# Patient Record
Sex: Female | Born: 1935 | Race: White | Hispanic: No | Marital: Married | Smoking: Former smoker
Health system: Southern US, Community
[De-identification: ages and names within clinical notes are randomized; demographics above are authoritative.]

## PROBLEM LIST (undated history)

## (undated) DIAGNOSIS — E559 Vitamin D deficiency, unspecified: Secondary | ICD-10-CM

## (undated) DIAGNOSIS — M1990 Unspecified osteoarthritis, unspecified site: Secondary | ICD-10-CM

## (undated) DIAGNOSIS — K222 Esophageal obstruction: Secondary | ICD-10-CM

## (undated) DIAGNOSIS — C44310 Basal cell carcinoma of skin of unspecified parts of face: Secondary | ICD-10-CM

## (undated) DIAGNOSIS — K317 Polyp of stomach and duodenum: Secondary | ICD-10-CM

## (undated) DIAGNOSIS — Z8719 Personal history of other diseases of the digestive system: Secondary | ICD-10-CM

## (undated) DIAGNOSIS — I10 Essential (primary) hypertension: Secondary | ICD-10-CM

## (undated) DIAGNOSIS — K649 Unspecified hemorrhoids: Secondary | ICD-10-CM

## (undated) DIAGNOSIS — H3530 Unspecified macular degeneration: Secondary | ICD-10-CM

## (undated) DIAGNOSIS — J449 Chronic obstructive pulmonary disease, unspecified: Secondary | ICD-10-CM

## (undated) DIAGNOSIS — K219 Gastro-esophageal reflux disease without esophagitis: Secondary | ICD-10-CM

## (undated) DIAGNOSIS — S329XXA Fracture of unspecified parts of lumbosacral spine and pelvis, initial encounter for closed fracture: Secondary | ICD-10-CM

## (undated) DIAGNOSIS — E669 Obesity, unspecified: Secondary | ICD-10-CM

## (undated) DIAGNOSIS — J45991 Cough variant asthma: Secondary | ICD-10-CM

## (undated) DIAGNOSIS — S62102A Fracture of unspecified carpal bone, left wrist, initial encounter for closed fracture: Secondary | ICD-10-CM

## (undated) DIAGNOSIS — K5000 Crohn's disease of small intestine without complications: Secondary | ICD-10-CM

## (undated) HISTORY — PX: TONSILLECTOMY AND ADENOIDECTOMY: SUR1326

## (undated) HISTORY — DX: Polyp of stomach and duodenum: K31.7

## (undated) HISTORY — DX: Crohn's disease of small intestine without complications: K50.00

## (undated) HISTORY — DX: Vitamin D deficiency, unspecified: E55.9

## (undated) HISTORY — PX: CATARACT EXTRACTION: SUR2

## (undated) HISTORY — PX: COLONOSCOPY: SHX174

## (undated) HISTORY — PX: APPENDECTOMY: SHX54

## (undated) HISTORY — DX: Essential (primary) hypertension: I10

## (undated) HISTORY — PX: BACK SURGERY: SHX140

## (undated) HISTORY — DX: Fracture of unspecified parts of lumbosacral spine and pelvis, initial encounter for closed fracture: S32.9XXA

## (undated) HISTORY — DX: Gastro-esophageal reflux disease without esophagitis: K21.9

## (undated) HISTORY — DX: Cough variant asthma: J45.991

## (undated) HISTORY — PX: ESOPHAGOGASTRODUODENOSCOPY: SHX1529

## (undated) HISTORY — DX: Esophageal obstruction: K22.2

## (undated) HISTORY — DX: Obesity, unspecified: E66.9

## (undated) HISTORY — DX: Chronic obstructive pulmonary disease, unspecified: J44.9

## (undated) HISTORY — DX: Unspecified osteoarthritis, unspecified site: M19.90

## (undated) HISTORY — DX: Unspecified hemorrhoids: K64.9

## (undated) HISTORY — DX: Fracture of unspecified carpal bone, left wrist, initial encounter for closed fracture: S62.102A

## (undated) HISTORY — DX: Unspecified macular degeneration: H35.30

---

## 1998-01-28 ENCOUNTER — Other Ambulatory Visit: Admission: RE | Admit: 1998-01-28 | Discharge: 1998-01-28 | Payer: Self-pay | Admitting: Obstetrics and Gynecology

## 1999-01-13 ENCOUNTER — Encounter: Admission: RE | Admit: 1999-01-13 | Discharge: 1999-01-13 | Payer: Self-pay | Admitting: Family Medicine

## 1999-02-12 ENCOUNTER — Other Ambulatory Visit: Admission: RE | Admit: 1999-02-12 | Discharge: 1999-02-12 | Payer: Self-pay | Admitting: Obstetrics and Gynecology

## 1999-05-06 ENCOUNTER — Encounter: Admission: RE | Admit: 1999-05-06 | Discharge: 1999-05-06 | Payer: Self-pay | Admitting: Family Medicine

## 1999-05-10 ENCOUNTER — Encounter: Payer: Self-pay | Admitting: Sports Medicine

## 1999-05-10 ENCOUNTER — Encounter: Admission: RE | Admit: 1999-05-10 | Discharge: 1999-05-10 | Payer: Self-pay | Admitting: Sports Medicine

## 1999-05-11 ENCOUNTER — Other Ambulatory Visit: Admission: RE | Admit: 1999-05-11 | Discharge: 1999-05-11 | Payer: Self-pay | Admitting: Obstetrics and Gynecology

## 1999-05-11 ENCOUNTER — Encounter (INDEPENDENT_AMBULATORY_CARE_PROVIDER_SITE_OTHER): Payer: Self-pay

## 1999-09-06 ENCOUNTER — Encounter: Admission: RE | Admit: 1999-09-06 | Discharge: 1999-09-06 | Payer: Self-pay | Admitting: Family Medicine

## 1999-09-16 ENCOUNTER — Ambulatory Visit (HOSPITAL_BASED_OUTPATIENT_CLINIC_OR_DEPARTMENT_OTHER): Admission: RE | Admit: 1999-09-16 | Discharge: 1999-09-16 | Payer: Self-pay | Admitting: Orthopedic Surgery

## 1999-09-16 ENCOUNTER — Encounter (INDEPENDENT_AMBULATORY_CARE_PROVIDER_SITE_OTHER): Payer: Self-pay | Admitting: *Deleted

## 2000-03-09 ENCOUNTER — Other Ambulatory Visit: Admission: RE | Admit: 2000-03-09 | Discharge: 2000-03-09 | Payer: Self-pay | Admitting: Obstetrics and Gynecology

## 2001-06-14 ENCOUNTER — Other Ambulatory Visit: Admission: RE | Admit: 2001-06-14 | Discharge: 2001-06-14 | Payer: Self-pay | Admitting: Obstetrics and Gynecology

## 2001-07-27 ENCOUNTER — Encounter (INDEPENDENT_AMBULATORY_CARE_PROVIDER_SITE_OTHER): Payer: Self-pay | Admitting: Gastroenterology

## 2002-07-01 ENCOUNTER — Other Ambulatory Visit: Admission: RE | Admit: 2002-07-01 | Discharge: 2002-07-01 | Payer: Self-pay | Admitting: Obstetrics and Gynecology

## 2003-09-19 ENCOUNTER — Other Ambulatory Visit: Admission: RE | Admit: 2003-09-19 | Discharge: 2003-09-19 | Payer: Self-pay | Admitting: Obstetrics and Gynecology

## 2004-10-22 ENCOUNTER — Ambulatory Visit (HOSPITAL_COMMUNITY): Admission: RE | Admit: 2004-10-22 | Discharge: 2004-10-22 | Payer: Self-pay | Admitting: Internal Medicine

## 2004-12-31 ENCOUNTER — Other Ambulatory Visit: Admission: RE | Admit: 2004-12-31 | Discharge: 2004-12-31 | Payer: Self-pay | Admitting: Internal Medicine

## 2005-02-28 DIAGNOSIS — S329XXA Fracture of unspecified parts of lumbosacral spine and pelvis, initial encounter for closed fracture: Secondary | ICD-10-CM

## 2005-02-28 HISTORY — PX: TOTAL KNEE ARTHROPLASTY: SHX125

## 2005-02-28 HISTORY — DX: Fracture of unspecified parts of lumbosacral spine and pelvis, initial encounter for closed fracture: S32.9XXA

## 2005-03-16 ENCOUNTER — Inpatient Hospital Stay (HOSPITAL_COMMUNITY): Admission: RE | Admit: 2005-03-16 | Discharge: 2005-03-19 | Payer: Self-pay | Admitting: Orthopedic Surgery

## 2006-12-20 ENCOUNTER — Encounter: Admission: RE | Admit: 2006-12-20 | Discharge: 2006-12-20 | Payer: Self-pay | Admitting: Internal Medicine

## 2007-01-03 ENCOUNTER — Ambulatory Visit: Payer: Self-pay | Admitting: Internal Medicine

## 2007-04-20 DIAGNOSIS — I10 Essential (primary) hypertension: Secondary | ICD-10-CM

## 2007-04-20 DIAGNOSIS — E669 Obesity, unspecified: Secondary | ICD-10-CM | POA: Insufficient documentation

## 2007-04-20 DIAGNOSIS — M810 Age-related osteoporosis without current pathological fracture: Secondary | ICD-10-CM | POA: Insufficient documentation

## 2007-04-20 DIAGNOSIS — E559 Vitamin D deficiency, unspecified: Secondary | ICD-10-CM | POA: Insufficient documentation

## 2007-04-26 ENCOUNTER — Other Ambulatory Visit: Admission: RE | Admit: 2007-04-26 | Discharge: 2007-04-26 | Payer: Self-pay | Admitting: Family Medicine

## 2007-11-26 ENCOUNTER — Ambulatory Visit: Payer: Self-pay | Admitting: Internal Medicine

## 2007-11-26 ENCOUNTER — Encounter: Payer: Self-pay | Admitting: Internal Medicine

## 2007-11-26 DIAGNOSIS — M542 Cervicalgia: Secondary | ICD-10-CM

## 2007-11-26 DIAGNOSIS — M5020 Other cervical disc displacement, unspecified cervical region: Secondary | ICD-10-CM | POA: Insufficient documentation

## 2007-11-28 ENCOUNTER — Telehealth (INDEPENDENT_AMBULATORY_CARE_PROVIDER_SITE_OTHER): Payer: Self-pay | Admitting: *Deleted

## 2007-12-16 ENCOUNTER — Ambulatory Visit: Payer: Self-pay | Admitting: Internal Medicine

## 2007-12-16 ENCOUNTER — Observation Stay (HOSPITAL_COMMUNITY): Admission: EM | Admit: 2007-12-16 | Discharge: 2007-12-21 | Payer: Self-pay | Admitting: Emergency Medicine

## 2007-12-16 ENCOUNTER — Ambulatory Visit: Payer: Self-pay | Admitting: *Deleted

## 2007-12-17 ENCOUNTER — Ambulatory Visit: Payer: Self-pay | Admitting: *Deleted

## 2007-12-18 ENCOUNTER — Ambulatory Visit: Payer: Self-pay | Admitting: Physical Medicine & Rehabilitation

## 2008-01-15 ENCOUNTER — Telehealth: Payer: Self-pay | Admitting: Internal Medicine

## 2008-02-04 ENCOUNTER — Ambulatory Visit: Payer: Self-pay | Admitting: Internal Medicine

## 2008-02-04 DIAGNOSIS — S79919A Unspecified injury of unspecified hip, initial encounter: Secondary | ICD-10-CM

## 2008-02-04 DIAGNOSIS — S79929A Unspecified injury of unspecified thigh, initial encounter: Secondary | ICD-10-CM

## 2008-02-20 ENCOUNTER — Telehealth: Payer: Self-pay | Admitting: Internal Medicine

## 2008-06-05 ENCOUNTER — Ambulatory Visit: Payer: Self-pay | Admitting: Internal Medicine

## 2008-06-05 DIAGNOSIS — M5412 Radiculopathy, cervical region: Secondary | ICD-10-CM | POA: Insufficient documentation

## 2008-06-05 LAB — CONVERTED CEMR LAB
ALT: 32 units/L (ref 0–35)
AST: 33 units/L (ref 0–37)
Basophils Absolute: 0.1 10*3/uL (ref 0.0–0.1)
Basophils Relative: 1 % (ref 0.0–3.0)
Bilirubin Urine: NEGATIVE
CO2: 27 meq/L (ref 19–32)
Calcium: 9.3 mg/dL (ref 8.4–10.5)
Chloride: 108 meq/L (ref 96–112)
Eosinophils Relative: 3.5 % (ref 0.0–5.0)
Glucose, Bld: 111 mg/dL — ABNORMAL HIGH (ref 70–99)
HCT: 39.1 % (ref 36.0–46.0)
Lymphocytes Relative: 20.2 % (ref 12.0–46.0)
MCHC: 33.6 g/dL (ref 30.0–36.0)
Magnesium: 2.3 mg/dL (ref 1.5–2.5)
Monocytes Absolute: 0.4 10*3/uL (ref 0.1–1.0)
Monocytes Relative: 5.6 % (ref 3.0–12.0)
Platelets: 311 10*3/uL (ref 150.0–400.0)
Potassium: 4.6 meq/L (ref 3.5–5.1)
RDW: 12.5 % (ref 11.5–14.6)
Total Protein: 7 g/dL (ref 6.0–8.3)
Urine Glucose: NEGATIVE mg/dL
VLDL: 19.4 mg/dL (ref 0.0–40.0)
WBC: 7.8 10*3/uL (ref 4.5–10.5)

## 2008-06-09 ENCOUNTER — Encounter: Payer: Self-pay | Admitting: Internal Medicine

## 2008-06-09 LAB — CONVERTED CEMR LAB: Vit D, 25-Hydroxy: 35 ng/mL (ref 30–89)

## 2008-06-10 ENCOUNTER — Encounter: Payer: Self-pay | Admitting: Internal Medicine

## 2008-06-10 ENCOUNTER — Ambulatory Visit: Payer: Self-pay | Admitting: Family Medicine

## 2008-06-11 ENCOUNTER — Encounter: Admission: RE | Admit: 2008-06-11 | Discharge: 2008-06-11 | Payer: Self-pay | Admitting: Internal Medicine

## 2008-06-13 ENCOUNTER — Encounter: Admission: RE | Admit: 2008-06-13 | Discharge: 2008-06-13 | Payer: Self-pay | Admitting: Internal Medicine

## 2008-06-16 ENCOUNTER — Encounter: Payer: Self-pay | Admitting: Internal Medicine

## 2008-06-26 ENCOUNTER — Ambulatory Visit: Payer: Self-pay | Admitting: Internal Medicine

## 2008-06-26 DIAGNOSIS — R7309 Other abnormal glucose: Secondary | ICD-10-CM | POA: Insufficient documentation

## 2008-07-15 ENCOUNTER — Encounter: Admission: RE | Admit: 2008-07-15 | Discharge: 2008-08-14 | Payer: Self-pay | Admitting: Internal Medicine

## 2009-01-20 ENCOUNTER — Ambulatory Visit: Payer: Self-pay | Admitting: Internal Medicine

## 2009-01-20 DIAGNOSIS — R0789 Other chest pain: Secondary | ICD-10-CM | POA: Insufficient documentation

## 2009-01-21 ENCOUNTER — Emergency Department (HOSPITAL_COMMUNITY): Admission: EM | Admit: 2009-01-21 | Discharge: 2009-01-21 | Payer: Self-pay | Admitting: Emergency Medicine

## 2009-01-21 ENCOUNTER — Telehealth: Payer: Self-pay | Admitting: Internal Medicine

## 2009-02-23 ENCOUNTER — Telehealth: Payer: Self-pay | Admitting: Internal Medicine

## 2009-05-11 ENCOUNTER — Telehealth: Payer: Self-pay | Admitting: Internal Medicine

## 2009-11-03 ENCOUNTER — Ambulatory Visit (HOSPITAL_BASED_OUTPATIENT_CLINIC_OR_DEPARTMENT_OTHER): Admission: RE | Admit: 2009-11-03 | Discharge: 2009-11-03 | Payer: Self-pay | Admitting: Orthopedic Surgery

## 2009-12-29 ENCOUNTER — Encounter: Payer: Self-pay | Admitting: Internal Medicine

## 2010-03-28 LAB — CONVERTED CEMR LAB
BUN: 16 mg/dL (ref 6–23)
Bilirubin, Direct: 0.1 mg/dL (ref 0.0–0.3)
CO2: 30 meq/L (ref 19–32)
Chloride: 104 meq/L (ref 96–112)
Creatinine, Ser: 0.9 mg/dL (ref 0.4–1.2)
Eosinophils Absolute: 0.5 10*3/uL (ref 0.0–0.7)
Glucose, Bld: 114 mg/dL — ABNORMAL HIGH (ref 70–99)
Lymphocytes Relative: 22.3 % (ref 12.0–46.0)
MCHC: 33.9 g/dL (ref 30.0–36.0)
Neutrophils Relative %: 63.3 % (ref 43.0–77.0)
Sodium: 141 meq/L (ref 135–145)
WBC: 7 10*3/uL (ref 4.5–10.5)

## 2010-03-30 NOTE — Miscellaneous (Signed)
Summary: flu shot  Clinical Lists Changes  Observations: Added new observation of FLU VAX: Historical (12/28/2009 9:49)      Immunization History:  Influenza Immunization History:    Influenza:  historical (12/28/2009) [facility] [address] [facility] 0.59m Left Deltoid IM [employer] Lot# [id number]

## 2010-03-30 NOTE — Progress Notes (Signed)
Summary: WORK IN?   Phone Note Call from Patient Call back at [phone]   Summary of Call: Pt cut her finger with a kitchen knife today. Bleeding is under control but feels that she needs sutures and would like to know if Dr Ronnald Ramp would work pt into schedule for this?  Initial call taken by: Charlsie Quest, [place],  May 11, 2009 2:03 PM  Follow-up for Phone Call        i can't do this today Follow-up by: Janith Lima MD,  May 11, 2009 3:09 PM  Additional Follow-up for Phone Call Additional follow up Details #1::        Pt informed & advised to go to [facility] or ER Additional Follow-up by: Charlsie Quest, CMA,  May 11, 2009 3:53 PM

## 2010-04-19 NOTE — Op Note (Signed)
NAME:  Ashley Parker, Ashley Parker              ACCOUNT NO.:  [id number]  MEDICAL RECORD NO.:  [id number]          PATIENT TYPE:  AMB  LOCATION:  [place]                          FACILITY:  [facility]  PHYSICIAN:  Daryll Brod, [name]       DATE OF BIRTH:  1931/06/24  DATE OF PROCEDURE:  11/03/2009 DATE OF DISCHARGE:                              OPERATIVE REPORT  PREOPERATIVE DIAGNOSIS:  Mucoid tumor right ring finger.  POSTOPERATIVE DIAGNOSIS:  Mucoid tumor right ring finger.  OPERATION:  Decompression cyst, debridement distal interphalangeal joint with excision cyst right ring finger.  SURGEON:  Daryll Brod, MD  ANESTHESIA:  Regional with local infiltration.  ANESTHESIOLOGIST:  Nelda Severe. Tobias Alexander, MD  HISTORY:  The patient is a 77 year old female with a history of a mass on the dorsal aspect of right ring finger.  This is grooving the distal nail to the termination.  She is not complaining of pain or discomfort. The skin is translucent.  X-rays reveal mild degenerative change in distal interphalangeal joint.  Transillumination reveals a cyst.  She is desirous of having this removed.  She is aware of risks and complications including infection, recurrence of injury to arteries, nerves, tendons, incomplete relief of symptoms, dystrophy, the possibility of recurrence.  So long as a joint is present, we would not recommend fusion at this point in time.  PROCEDURE:  The patient was brought to the operating room where a forearm-based IV regional anesthetic was carried out without difficulty. After the patient was seen in the preoperative area, the extremity was marked by both the patient and surgeon, questions encouraged and answered, and antibiotic given.  Following adequate anesthesia, she was draped and prepped using ChloraPrep, supine position, right arm free.  A 3-minute dry time was allowed, time-out taken, confirming the patient and procedure.  A curvilinear incision was made over the  distal interphalangeal joint right ring finger, carried down through subcutaneous tissue.  Bleeders were electrocauterized with bipolar.  The skin was elevated distally with sharp dissection taking care to protect the nail matrix extensor tendon.  The stalk of the cyst was followed distally with a house curette beneath the skin.  The cyst was bluntly dissected and removed with small rongeur.  The joint was opened where the stalk egressed.  A debridement of the joint was then performed with complete synovectomy.  The specimen was sent to Pathology.  No further lesions were identified.  The wound was copiously irrigated with saline. The skin then closed with interrupted 5-0 Vicryl Rapide sutures.  Local infiltration with 0.25% Marcaine as a digital block was given, approximately 5 mL was used. A sterile compressive dressing and splint to the finger applied.  On deflation of the tourniquet, all fingers pinked.  She was taken to the recovery room for observation in satisfactory condition.  She will be discharged home to return to the [place] [facility] in 1 week on Vicodin.          ______________________________ Daryll Brod, [name]    GK/MEDQ  D:  11/03/2009  T:  11/03/2009  Job:  [id number]  cc:   Scarlette Calico, MD  Electronically Signed by Daryll Brod [name] on 04/19/2010 12:05:34 PM

## 2010-05-13 LAB — BASIC METABOLIC PANEL
BUN: 19 mg/dL (ref 6–23)
CO2: 24 mEq/L (ref 19–32)
Calcium: 9.3 mg/dL (ref 8.4–10.5)
Chloride: 107 mEq/L (ref 96–112)
Creatinine, Ser: 0.93 mg/dL (ref 0.4–1.2)
GFR calc Af Amer: >60 mL/min (ref >60)
GFR calc non Af Amer: 59 mL/min — ABNORMAL LOW (ref >60)
Glucose, Bld: 137 mg/dL — ABNORMAL HIGH (ref 70–99)
Potassium: 3.8 mEq/L (ref 3.5–5.1)
Sodium: 139 mEq/L (ref 135–145)

## 2010-05-13 LAB — POCT HEMOGLOBIN-HEMACUE: Hemoglobin: 14.9 g/dL (ref 12.0–15.0)

## 2010-06-02 LAB — POCT I-STAT, CHEM 8
BUN: 18 mg/dL (ref 6–23)
Calcium, Ion: 1.11 mmol/L — ABNORMAL LOW (ref 1.12–1.32)
Chloride: 105 mEq/L (ref 96–112)
Glucose, Bld: 112 mg/dL — ABNORMAL HIGH (ref 70–99)
HCT: 45 % (ref 36.0–46.0)
Potassium: 4 mEq/L (ref 3.5–5.1)

## 2010-06-02 LAB — D-DIMER, QUANTITATIVE: D-Dimer, Quant: 0.88 ug/mL-FEU — ABNORMAL HIGH (ref 0.00–0.48)

## 2010-06-18 ENCOUNTER — Other Ambulatory Visit (INDEPENDENT_AMBULATORY_CARE_PROVIDER_SITE_OTHER): Payer: Medicare Other

## 2010-06-18 ENCOUNTER — Encounter: Payer: Self-pay | Admitting: Internal Medicine

## 2010-06-18 ENCOUNTER — Ambulatory Visit (INDEPENDENT_AMBULATORY_CARE_PROVIDER_SITE_OTHER)
Admission: RE | Admit: 2010-06-18 | Discharge: 2010-06-18 | Disposition: A | Discharge status: Home / Self Care | Payer: Medicare Other | Source: Ambulatory Visit | Attending: Internal Medicine | Admitting: Internal Medicine

## 2010-06-18 ENCOUNTER — Ambulatory Visit (INDEPENDENT_AMBULATORY_CARE_PROVIDER_SITE_OTHER): Payer: Medicare Other | Admitting: Internal Medicine

## 2010-06-18 ENCOUNTER — Other Ambulatory Visit (INDEPENDENT_AMBULATORY_CARE_PROVIDER_SITE_OTHER): Payer: Medicare Other | Admitting: Internal Medicine

## 2010-06-18 VITALS — BP 118/82 | HR 72 | Temp 98.4°F | Resp 16 | Wt 226.0 lb

## 2010-06-18 DIAGNOSIS — R7309 Other abnormal glucose: Secondary | ICD-10-CM

## 2010-06-18 DIAGNOSIS — I10 Essential (primary) hypertension: Secondary | ICD-10-CM

## 2010-06-18 DIAGNOSIS — Z87892 Personal history of anaphylaxis: Secondary | ICD-10-CM

## 2010-06-18 DIAGNOSIS — S8990XA Unspecified injury of unspecified lower leg, initial encounter: Secondary | ICD-10-CM

## 2010-06-18 DIAGNOSIS — E559 Vitamin D deficiency, unspecified: Secondary | ICD-10-CM

## 2010-06-18 DIAGNOSIS — M810 Age-related osteoporosis without current pathological fracture: Secondary | ICD-10-CM

## 2010-06-18 DIAGNOSIS — S99929A Unspecified injury of unspecified foot, initial encounter: Secondary | ICD-10-CM

## 2010-06-18 DIAGNOSIS — E785 Hyperlipidemia, unspecified: Secondary | ICD-10-CM

## 2010-06-18 DIAGNOSIS — S8991XA Unspecified injury of right lower leg, initial encounter: Secondary | ICD-10-CM

## 2010-06-18 DIAGNOSIS — K5080 Crohn's disease of both small and large intestine without complications: Secondary | ICD-10-CM

## 2010-06-18 LAB — COMPREHENSIVE METABOLIC PANEL
ALT: 26 U/L (ref 0–35)
AST: 24 U/L (ref 0–37)
Albumin: 4.2 g/dL (ref 3.5–5.2)
BUN: 23 mg/dL (ref 6–23)
CO2: 27 mEq/L (ref 19–32)
Calcium: 10.3 mg/dL (ref 8.4–10.5)
Chloride: 104 mEq/L (ref 96–112)
Creatinine, Ser: 0.7 mg/dL (ref 0.4–1.2)
GFR: 83.45 mL/min (ref >60.00)
Potassium: 4.4 mEq/L (ref 3.5–5.1)

## 2010-06-18 LAB — HEMOGLOBIN A1C: Hgb A1c MFr Bld: 5.8 % (ref 4.6–6.5)

## 2010-06-18 LAB — CBC WITH DIFFERENTIAL/PLATELET
Basophils Absolute: 0.1 10*3/uL (ref 0.0–0.1)
Basophils Relative: 1.1 % (ref 0.0–3.0)
Eosinophils Absolute: 0.3 10*3/uL (ref 0.0–0.7)
HCT: 41.5 % (ref 36.0–46.0)
Hemoglobin: 14.3 g/dL (ref 12.0–15.0)
Lymphocytes Relative: 21.6 % (ref 12.0–46.0)
Lymphs Abs: 1.7 10*3/uL (ref 0.7–4.0)
MCHC: 34.3 g/dL (ref 30.0–36.0)
Neutro Abs: 5.3 10*3/uL (ref 1.4–7.7)
RBC: 4.69 Mil/uL (ref 3.87–5.11)
RDW: 13.7 % (ref 11.5–14.6)

## 2010-06-18 LAB — LIPID PANEL: Cholesterol: 221 mg/dL — ABNORMAL HIGH (ref 0–200)

## 2010-06-18 MED ORDER — LISINOPRIL-HYDROCHLOROTHIAZIDE 20-25 MG PO TABS: 1.0000 | ORAL_TABLET | Freq: Every day | ORAL | Status: DC

## 2010-06-18 MED ORDER — EPINEPHRINE 0.3 MG/0.3ML IJ DEVI: 0.3000 mg | Freq: Once | INTRAMUSCULAR | Status: AC

## 2010-06-18 NOTE — Assessment & Plan Note (Signed)
Will check an xray and treat accordingly

## 2010-06-18 NOTE — Assessment & Plan Note (Signed)
Will recheck her vit D level today

## 2010-06-18 NOTE — Patient Instructions (Signed)
Bruise, Contusion, Hematoma A bruise (contusion) or hematoma is a collection of blood under skin causing an area of discoloration. It is caused by an injury to blood vessels beneath the injured area with a release of blood into that area. As blood accumulates it is known as a hematoma. This collection of blood causes a blue to dark blue color. As the injury improves over days to weeks it turns to a yellowish color and then usually disappears completely over the same period of time. These generally resolve completely without problems. The hematoma rarely requires drainage. HOME CARE INSTRUCTIONS  Apply ice to the injured area for 20 minutes 3 times per day for the first 1 or 2 days.   Put the ice in a plastic bag and place a towel between the bag of ice and your skin. Discontinue the ice if it causes pain.   If bleeding is more than just a little, apply pressure to the area for at least thirty minutes to decrease the amount of bruising. Apply pressure and ice as your caregiver suggests.   If the injury is on an extremity, elevation of that part may help to decrease pain and swelling. Wrapping with an ace or supportive wrap may also be helpful. If the bruise is on a lower extremity and is painful, crutches may be helpful for a couple days.   If you have been given a tetanus shot because the skin was broken, your arm may get swollen, red and warm to touch at the shot site. This is a normal response to the medicine in the shot. If you did not receive a tetanus shot today because you did not recall when your last one was given, make sure to check with your caregiver's office and determine if one is needed. Generally for a "dirty" wound, you should receive a tetanus booster if you have not had one in the last five years. If you have a "clean" wound, you should receive a tetanus booster if you have not had one within the last ten years.  SEEK MEDICAL CARE IF:  You have pain not controlled with over the  counter medications. Only take over-the-counter or prescription medicines for pain, discomfort, or fever as directed by your caregiver. Do not use aspirin as it may cause bleeding.   You develop increasing pain or swelling in the area of injury.   An oral temperature above 100.5 develops.   You develop any problems which seem worse than the problems which brought you in.  SEEK IMMEDIATE MEDICAL CARE IF:  You develop severe pain in the area of the bruise out of proportion to the initial injury.   The bruised area becomes red, tender, and swollen.  MAKE SURE YOU:   Understand these instructions.   Will watch your condition.   Will get help right away if you are not doing well or get worse.  Document Released: 11/24/2004 Document Re-Released: 05/13/2008 [facility] Patient Information 2011 [place].Hypertension (High Blood Pressure) As your heart beats, it forces blood through your arteries. This force is your blood pressure. If the pressure is too high, it is called hypertension (HTN) or high blood pressure. HTN is dangerous because you may have it and not know it. High blood pressure may mean that your heart has to work harder to pump blood. Your arteries may be narrow or stiff. The extra work puts you at risk for heart disease, stroke, and other problems.  Blood pressure consists of two numbers, a higher  number over a lower, 110/72, for example. It is stated as "110 over 72." The ideal is below 120 for the top number (systolic) and under 80 for the bottom (diastolic). Write down your blood pressure today. You should pay close attention to your blood pressure if you have certain conditions such as:  Heart failure.  Prior heart attack.   Diabetes   Chronic kidney disease.   Prior stroke.   Multiple risk factors for heart disease.   To see if you have HTN, your blood pressure should be measured while you are seated with your arm held at the level of the heart. It should be  measured at least twice. A one-time elevated blood pressure reading (especially in the Emergency Department) does not mean that you need treatment. There may be conditions in which the blood pressure is different between your right and left arms. It is important to see your caregiver soon for a recheck. Most people have essential hypertension which means that there is not a specific cause. This type of high blood pressure may be lowered by changing lifestyle factors such as:  Stress.  Smoking.   Lack of exercise.   Excessive weight.  Drug/tobacco/alcohol use.   Eating less salt.   Most people do not have symptoms from high blood pressure until it has caused damage to the body. Effective treatment can often prevent, delay or reduce that damage. TREATMENT Treatment for high blood pressure, when a cause has been identified, is directed at the cause. There are a large number of medications to treat HTN. These fall into several categories, and your caregiver will help you select the medicines that are best for you. Medications may have side effects. You should review side effects with your caregiver. If your blood pressure stays high after you have made lifestyle changes or started on medicines,   Your medication(s) may need to be changed.   Other problems may need to be addressed.   Be certain you understand your prescriptions, and know how and when to take your medicine.   Be sure to follow up with your caregiver within the time frame advised (usually within two weeks) to have your blood pressure rechecked and to review your medications.   If you are taking more than one medicine to lower your blood pressure, make sure you know how and at what times they should be taken. Taking two medicines at the same time can result in blood pressure that is too low.  SEEK IMMEDIATE MEDICAL CARE IF YOU DEVELOP:  A severe headache, blurred or changing vision, or confusion.   Unusual weakness or  numbness, or a faint feeling.   Severe chest or abdominal pain, vomiting, or breathing problems.  MAKE SURE YOU:   Understand these instructions.   Will watch your condition.   Will get help right away if you are not doing well or get worse.  Document Released: 02/14/2005 Document Re-Released: 08/04/2009 [facility] Patient Information 2011 [place].

## 2010-06-18 NOTE — Progress Notes (Signed)
Subjective:    Patient ID: Ashley Parker, female    DOB: 01-Aug-1931, 77 y.o.   MRN: [id number]  Hypertension This is a chronic problem. The current episode started more than 1 year ago. The problem has been gradually improving since onset. The problem is controlled. Pertinent negatives include no anxiety, blurred vision, chest pain, headaches, malaise/fatigue, neck pain, orthopnea, palpitations, peripheral edema, PND, shortness of breath or sweats. There are no associated agents to hypertension. Past treatments include ACE inhibitors and diuretics. The current treatment provides significant improvement. There are no compliance problems.    Also, she fell one week ago and landed on her right knee and has bruising, swelling, and pain around the knee. She has been taking percocet and naproxen for the pain. She can bear weight and bend the knee without much difficulty.  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   Review of Systems  Constitutional: Negative for fever, chills, malaise/fatigue, diaphoresis, activity change, appetite change, fatigue and unexpected weight change.  HENT: Negative for neck pain.   Eyes: Negative for blurred vision, pain, redness and itching.  Respiratory: Negative for apnea, cough, choking, chest tightness, shortness of breath, wheezing and stridor.   Cardiovascular: Negative for chest pain, palpitations, orthopnea, leg swelling and PND.  Gastrointestinal: Negative for nausea, abdominal pain, diarrhea, constipation, blood in stool and abdominal distention.  Genitourinary: Negative for dysuria, urgency, frequency, hematuria, decreased urine volume and difficulty urinating.  Musculoskeletal: Positive for joint swelling and arthralgias (right knee). Negative for back pain and gait problem.  Skin: Negative for color change, pallor and rash.  Neurological: Negative for dizziness, tremors, seizures, syncope, facial asymmetry, speech  difficulty, weakness, light-headedness, numbness and headaches.  Hematological: Negative for adenopathy.  Psychiatric/Behavioral: Negative for behavioral problems, self-injury, dysphoric mood and agitation. The patient is not nervous/anxious.       Lab Results  Component Value Date   WBC 7.0 01/20/2009   HGB 14.9 11/03/2009   HCT 45.0 01/21/2009   PLT 312.0 01/20/2009   CHOL 197 06/05/2008   TRIG 97.0 06/05/2008   HDL 54.10 06/05/2008   ALT 31 01/20/2009   AST 26 01/20/2009   NA 139 10/29/2009   K 3.8 10/29/2009   CL 107 10/29/2009   CREATININE 0.93 10/29/2009   BUN 19 10/29/2009   CO2 24 10/29/2009   TSH 1.31 06/05/2008   HGBA1C 5.9 01/20/2009    Objective:   Physical Exam  Constitutional: She is oriented to person, place, and time. She appears well-developed and well-nourished.  HENT:  Head: Normocephalic and atraumatic.  Right Ear: External ear normal.  Left Ear: External ear normal.  Nose: Nose normal.  Mouth/Throat: Oropharynx is clear and moist. No oropharyngeal exudate.  Eyes: Conjunctivae and EOM are normal. Pupils are equal, round, and reactive to light. Right eye exhibits no discharge. Left eye exhibits no discharge. No scleral icterus.  Neck: Normal range of motion. Neck supple. No JVD present. No tracheal deviation present. No thyromegaly present.  Cardiovascular: Normal rate, regular rhythm, normal heart sounds and intact distal pulses.  Exam reveals no gallop and no friction rub.   No murmur heard. Pulmonary/Chest: Effort normal and breath sounds normal. No respiratory distress. She has no wheezes. She has no rales. She exhibits no tenderness.  Abdominal: Soft. Bowel sounds are normal. She exhibits no distension and no mass. There is no tenderness. There is no rebound and no guarding.  Musculoskeletal:  Right knee: She exhibits swelling, effusion, ecchymosis, deformity (patella looks like it is displaced laterally) and abnormal patellar mobility. She exhibits normal range of  motion, no laceration, no erythema, normal alignment, no LCL laxity and no bony tenderness. no tenderness found. No medial joint line, no lateral joint line, no MCL and no LCL tenderness noted.  Lymphadenopathy:    She has no cervical adenopathy.  Neurological: She is alert and oriented to person, place, and time. She has normal reflexes. She displays normal reflexes. No cranial nerve deficit. She exhibits normal muscle tone. Coordination normal.  Skin: Skin is warm and dry. No rash noted. She is not diaphoretic. No erythema. No pallor.  Psychiatric: She has a normal mood and affect. Her behavior is normal. Judgment and thought content normal.          Assessment & Plan:

## 2010-06-18 NOTE — Assessment & Plan Note (Addendum)
Check an A1C today and will advise if treatment for DM is needed

## 2010-06-18 NOTE — Assessment & Plan Note (Signed)
Use epi-pen as needed

## 2010-06-18 NOTE — Assessment & Plan Note (Signed)
She has no s/s to treat, will follow for now

## 2010-06-18 NOTE — Assessment & Plan Note (Signed)
Her BP is well controlled and she has no side effects from the meds., I will monitor her lytes and renal function today

## 2010-06-21 LAB — VITAMIN D 1,25 DIHYDROXY: Vitamin D2 1, 25 (OH)2: <8 pg/mL

## 2010-07-13 NOTE — Assessment & Plan Note (Signed)
[facility] OFFICE NOTE   Ashley, Parker                     MRN:          [id number]  DATE:01/03/2007                            DOB:          [birth year]    CHIEF COMPLAINT:  Here to discuss colonoscopy.   I was reviewing charts and Ashley Parker had a colonoscopy five years ago.  She was recommended for a five-year recall though she had no polyps.  She did have some active ileitis in what was a screening colonoscopy so  I asked her to come in.  She is not having any diarrhea or problems.  There is no history of Crohn's disease.  She does have occasional  heartburn 2-3 times a week, usually if she eats late.  Tums reliably  relieves this.  She has occasionally tried Prilosec OTC.  There is no  dysphagia, no weight loss.  In fact, she wants to lose weight and needs  to.  She is concerned because her first husband died of esophageal  cancer, though she really admits that she has only had heartburn  symptoms off and on for two, maybe three years.  There again, there is  no dysphagia except occasionally to pills.   ALLERGIES:  BIAXIN.   MEDICATIONS:  1. Xalatan eye drops.  2. Lisinopril.  3. She has been on Actonel.  She has held that because of these      heartburn symptoms.   PAST MEDICAL HISTORY:  1. Obesity.  2. Hypertension.  3. Osteoporosis.  4. Low vitamin D.  She has not yet started her vitamin D supplements.   FAMILY HISTORY:  Prostate cancer in her father.  Breast cancer in her  mother.  No colon cancer.   SOCIAL HISTORY:  See medical history form.   REVIEW OF SYSTEMS:  See medical history form.   VITAL SIGNS:  Height 5 feet 6, weight 234 pounds, blood pressure 118/80.   ASSESSMENT:  1. She is average risk for colon cancer screening.  Note, she      indicates she has had yearly Hemoccults which are negative.  She      should continue those.  2. Active ileitis, unclear etiology.   There is no clinical scenario to      suggest Crohn's disease or inflammatory bowel disease.  I suspect      that was an incidental finding and self-limited.  3. Gastroesophageal reflux disease.   PLAN:  1. Screening colonoscopy to be repeated in 2013.  2. Continue yearly Hemoccults.  If she is Hemoccult positive or has      significant change in bowels or bleeding, would perform colonoscopy      earlier.  3. Gastroesophageal reflux disease.  Diet handout given.  I have also      advised her to either try a daily H2 blocker like Zantac or perhaps      Prilosec OTC because of her two to three times a week symptoms.      She will try to lose weight as well.  I will see her back as needed  in the interim.     Gatha Mayer, MD,FACG  Electronically Signed    CEG/MedQ  DD: 01/03/2007  DT: 01/04/2007  Job #: [id number]   cc:   Ardeen Jourdain, [name]
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
## 2010-07-13 NOTE — Discharge Summary (Signed)
NAME:  Ashley Parker, Ashley Parker              ACCOUNT NO.:  [id number]   MEDICAL RECORD NO.:  [id number]          PATIENT TYPE:  OBV   LOCATION:  5523                         FACILITY:  [facility]   PHYSICIAN:  Evie Lacks. Plotnikov, MDDATE OF BIRTH:  October 09, 1931   DATE OF ADMISSION:  12/16/2007  DATE OF DISCHARGE:  12/21/2007                               DISCHARGE SUMMARY   .   DISCHARGE DIAGNOSES:  1. Intractable pain secondary to pubic rami fracture.  2. Group B streptococcus urinary tract infection and received 4 days      of Avelox during this admission.  3. Gastroesophageal reflux disease, currently stable.  Not on proton      pump inhibitor.  4. History of glaucoma.  Plan to continue home meds.  5. Hypertension.  Blood pressure is stable.  Continue home meds.  6. Debilitation secondary to fracture.  Plan to go to a short-term      skilled nursing facility at discharge.   HISTORY OF PRESENT ILLNESS:  Ashley Parker is a 77 year old female who was  admitted on December 16, 2007 with a chief complaint of intractable hip  pain.  She apparently had an incident on December 08, 2007 when she fell  on vacation, falling onto her right hip.  Since that time she has had  chronic right hip pain with decreased mobility.  Supposedly she had a  pelvic fracture which was being treated by Dr. Berenice Primas and was told that  it was a nonoperable fracture.  She has had difficulty ambulating and  performing ADLs and had also complained of issues of urinary urgency,  frequency and burning.  She was admitted for further evaluation and  likely placement for short-term skilled nursing facility.   COURSE OF HOSPITALIZATION.:  1. Pubic rami fracture.  The patient was admitted.  She had a pelvic x-      ray which noted a mildly displaced right superior and inferior      pubic rami fracture.  She was seen in consultation by orthopedics      during this admission.  They recommended ongoing rehabilitation and      a repeat x-ray  in 7-10 days.  At this time she is currently on an      increased dose of OxyContin as well as p.r.n. OxyIR.  We will      increase the frequency of the p.r.n. OxyIR.  She has required some      intermittent IV Dilaudid.  We will discontinue IV Dilaudid at time      of discharge.  She will need ongoing assessment of her pain and      medication adjustment as needed at the facility.   MEDICATIONS AT TIME OF DISCHARGE:  1. Lovenox 40 mg subcu daily for DVT prophylaxis.  2. Lisinopril 20 mg p.o. daily.  3. HCTZ 25 mg p.o. daily.  4. Alphagan eye drops 1 drop to each eye twice daily.  5. Lumigan eye drops 1 drop to each eye at bedtime.  6. Colace 100 mg p.o. b.i.d.  7. OxyContin 10 mg tab 3 tablets p.o. q.8  h standing.  8. Detrol 4 mg p.o. daily.  9. Phenergan 25 mg IV/p.o. every 6 hours as needed.  10.Imodium 2-4 mg as needed for diarrhea.  11.MiraLax 17 grams in 8 ounces of water daily as needed.  12.OxyIR 10 mg every 4 hours as needed for breakthrough pain.   PERTINENT LABORATORIES AT TIME OF DISCHARGE:  BUN 11, creatinine 0.75.  Urine culture December 16, 2007 75,000 colonies of group B strep.  Blood  cultures December 16, 2007 no growth to date x2.  Hemoglobin December 17, 2007 12.9, hematocrit 38.3.   PHYSICAL EXAM:  BP 135/72, heart rate 75, respiratory rate 20,  temperature 97.7, 02 sat 95% on room air.  In general, the patient is an elderly white female who appears younger  than her stated age.  She is awake and alert, in no acute distress.  HEENT:  Eyes:  She is noted to have bloodshot eyes.  Sclerae are  nonicteric.  ENT:  She has moist oral mucosa.  CARDIOVASCULAR:  S1, S2.  Regular rate and rhythm.  No lower extremity edema is noted.  RESPIRATORY:  Breath sounds are clear to auscultation bilaterally  without wheezes, rales or rhonchi.  No increased work of breathing.  ABDOMEN:  Soft, nontender, nondistended.  Positive bowel sounds are  noted.  No palpable masses.  NEURO:   She is moving all extremities.  PSYCHIATRIC:  She is A and O x3, pleasant.   DISPOSITION:  She will be discharged to a skilled nursing facility for  additional rehabilitation.   FOLLOW UP:  She is to follow up with Dr. Scarlette Calico at [facility] upon discharge from the facility.   ACTIVITY:  She is weightbear as tolerated.   DIET:  The patient is maintained on a low-sodium, heart-healthy diet.   Greater than 30 minutes were spent on discharge planning.      Debbrah Alar, NP      Evie Lacks. Plotnikov, MD  Electronically Signed    MO/MEDQ  D:  12/21/2007  T:  12/21/2007  Job:  [id number]   cc:   Alta Corning, [name]  Scarlette Calico, MD

## 2010-07-16 NOTE — Discharge Summary (Signed)
NAME:  Ashley Parker, Ashley Parker              ACCOUNT NO.:  [id number]   MEDICAL RECORD NO.:  [id number]          PATIENT TYPE:  INP   LOCATION:  5029                         FACILITY:  [facility]   PHYSICIAN:  Alta Corning, [name]   DATE OF BIRTH:  11/10/1931   DATE OF ADMISSION:  03/16/2005  DATE OF DISCHARGE:  03/19/2005                                 DISCHARGE SUMMARY   ADMITTING DIAGNOSES:  1.  End-stage osteoarthritis left knee.  2.  Osteoporosis.  3.  Obesity.   DISCHARGE DIAGNOSES:  1.  End-stage osteoarthritis left knee.  2.  Osteoporosis.  3.  Obesity.   PROCEDURES IN HOSPITAL:  Left total knee arthroplasty, computer assisted;  [name], [name], March 16, 2005.   BRIEF HISTORY:  Ashley Parker is a pleasant 77 year old female who is well  known to [place].  She has a long history of left knee pain secondary to  degenerative osteoarthritis.  Standing x-of the left knee showed bone-on-  bone arthritis.  She has night pain and pain with ambulation.  Based upon  her radiographic and clinical findings she was admitted for a left total  knee arthroplasty.   PERTINENT LABORATORY STUDIES:  The patient's EKG on admission showed normal  sinus rhythm, normal EKG.  Hemoglobin on admission was 14.1, hematocrit  41.5, WBCs 76.  Indices within normal limits.  On postop day morning,  hemoglobin was 10.8, hematocrit 31.5.  Postop day number two, hemoglobin was  10.0.  Postop day number three, hemoglobin 9.5.  Protime on admission was  12.4 seconds with an INR of 0.9, PTT was 27.  On the date of discharge, her  protime was 18.1 seconds with an INR of 1.5.  CMP on admission showed no  abnormalities other than slightly elevated ALT at 44, with normal being  between 0 to 40.  Urinalysis on admission showed no abnormalities.  BMP on  postop day number one was unremarkable.   HOSPITAL COURSE:  The patient underwent left total knee arthroplasty,  computer assisted, by [name], [name] on March 16, 2005 as  well  described in Dr. Berenice Primas' operative note.  Preoperatively, she was placed on  Ancef and gentamicin IV and was given five doses of Ancef postoperatively  prophylactically.  She was started on Coumadin thrombolytic therapy per  pharmacy protocol and a PCA morphine pump for pain control postop along with  IV fluids.  Incentive spirometry was ordered for pulmonary toilet, and  physical therapy was ordered for walker ambulation weightbearing as  tolerated on the left side.  On postop day number one, the patient was  overall doing well.  Her vital signs were stable.  Her hemoglobin was stable  at 10.8.  Her INR was 1.0.  She was gotten out of bed with physical therapy,  and CPM machine was used for range of motion of her operative knee.  On  postop day number two, her dressing was changed.  Hemoglobin was 10.8.  Her  INR was 1.1.  Her Hemovac drain was pulled.  She continued to progress with  physical therapy.  On postop day  number three, her vital signs were stable.  She was afebrile.  Her left knee wound was benign.  Her calf was soft and  nontender.  Hemoglobin was 9.5.  Her INR was 1.5.  She was progressing well  with physical therapy.  She was taking p.o. and voiding without difficulty.  She was discharged home after the IV was discontinued.  She was in improved  condition.  She was on a regular diet.  She was given an RX for Percocet 5  mg p.r.n. pain and Coumadin per pharmacy protocol for DVT prophylaxis times  one month postop.  Diet on discharge will be regular.  Activity status will  be weightbearing as tolerated on the left side with a walker.  She will also  use a home CPM.  She will get home health physical therapy for knee range of  motion and strengthening and home Coumadin management.  She will follow up  with Dr. Berenice Primas in the office in two weeks, sooner if any problems occur.      Gary Fleet, P.A.      Alta Corning, [name]  Electronically Signed    JB/MEDQ   D:  05/11/2005  T:  05/12/2005  Job:  [id number]   cc:   Marcelino Duster, [name]  Fax: [phone]

## 2010-07-16 NOTE — Op Note (Signed)
NAME:  Ashley Parker, Ashley Parker              ACCOUNT NO.:  [id number]   MEDICAL RECORD NO.:  [id number]          PATIENT TYPE:  INP   LOCATION:  2899                         FACILITY:  [facility]   PHYSICIAN:  Alta Corning, [name]   DATE OF BIRTH:  [birth year]   DATE OF PROCEDURE:  03/16/2005  DATE OF DISCHARGE:                                 OPERATIVE REPORT   PREOPERATIVE DIAGNOSIS:  End stage degenerative joint disease, left knee.   POSTOPERATIVE DIAGNOSIS:  End stage degenerative joint disease, left knee.   OPERATION PERFORMED:  1.  Left total knee replacement with [employer] system, 2.5      femur, 2.5 tibia, a 12.5 mm bridging bearing and a 35 mm all      polyethylene patella.  2.  Computer assisted knee replacement, left knee.   SURGEON:  Alta Corning, [name]   ASSISTANT:  Gary Fleet, P.A.   ANESTHESIA:  General.   INDICATIONS FOR PROCEDURE:  Ashley Parker is a 77 year old female with a long  history of having had significant pain in the left knee.  She was treated  conservatively for a long period of time but because of continuing  complaints of pain, she will be taken to the operating room for left total  knee replacement.  Because of the patient's youthful age and overall  youthful situation, it was felt that computer assisted alignment would be  appropriate to address her unique anatomy as well as her youthful age for  prolonged survivorship of her components.   DESCRIPTION OF PROCEDURE:  The patient was taken to the operating room and  after adequate anesthesia was obtained with general anesthetic, the patient  was placed on the operating table.  The left leg was prepped and draped in  the usual sterile fashion.  Following this, the leg was exsanguinated.  The  blood pressure tourniquet was inflated to 350 mmHg.  Following this, a  midline incision was made.  Subcutaneous tissue was dissected down to the  level of the extensor mechanism, medial arthrotomy was  undertaken.  The  patella was everted and the attention was turned to the proximal tibia which  was cut perpendicular to the long axis.  After the patella was everted, the  four pins were placed for the computer assisted alignment and registration  was then undertaken.  This adds about 20 minutes additional to the case.  At  this point once the registration was undertaken, the tibia was cut under  computer assistance perpendicular to the long axis.  The plan is then made  after  and once this was accomplished, attention was turned towards the  femoral side where the distal femoral cuts were made.  Once the distal  femoral cuts were made, the gaps were then balanced and checked with the  shims and then the rotational balance was set with the computer and the  anterior posterior cuts were made as well as the box cut and the chamfer  cuts.  Once this was done, the trial femur was put in place.  The tibia was  then evaluated  and set perpendicular to the long axis, the rotational  alignment was set.  2.5 mm tibia chosen.  Then the long center drill was  made and then the lugs were drilled.  Once this was accomplished, the  patella was then measured and then cut. Once this was done, all the trial  components were removed and the final components were then cemented in place  and a 2.5 femur and size 2.5 tibia, 35 mm all poly patella, a medium Hemovac  drain was then placed and once this was performed then the alignment was  checked with computer assistance.  We had perfect long alignment, neutral  varus valgus alignment and easy full extension with a degree of  hyperextension.  The knee was then copiously irrigated and suctioned dry.  The medial parapatellar arthrotomy was closed with 1 Vicryl running suture.  The skin was closed with 0 and 2-0 Vicryl and the skin with 3-0 Mersilene  subcuticular stitch.  Benzoin and Steri-Strips were applied. Sterile  compressive dressing was applied.  The  patient was then transferred to the  recovery room where she was noted to be in satisfactory condition.  The  estimated blood loss for this procedure was none.      Alta Corning, [name]  Electronically Signed     JLG/MEDQ  D:  03/16/2005  T:  03/16/2005  Job:  [id number]

## 2010-07-16 NOTE — Op Note (Signed)
[place]  Patient:    Ashley Parker, Ashley Parker                     MRN: [id number] Proc. Date: 09/16/99 Adm. Date:  [id number] Attending:  Sheran Luz CC:         Wynonia Sours, [name] (2)                           Operative Report  PREOPERATIVE DIAGNOSIS: Mucoid cyst, right middle finger.  POSTOPERATIVE DIAGNOSIS: Mucoid cyst, right middle finger.  OPERATION/PROCEDURE: Excision of mucoid cyst with debridement of distal interphalangeal joint of right middle finger.  SURGEON: Wynonia Sours, [name]  ASSISTANT: R.N.  ANESTHESIA: Forearm based IV regional.  ANESTHESIOLOGIST: Dr. Tamala Julian.  INDICATIONS FOR PROCEDURE: The patient is a 77 year old female with a history of an enlarging mass over the dorsal aspect of her right middle finger. X-rays reveal arthritic changes.  DESCRIPTION OF PROCEDURE: The patient was brought to the operating room and a forearm based IV regional anesthetic was carried out without difficulty.  She was prepped and draped using Betadine scrubbing solution with the right arm free and a curvilinear incision was made and carried down through subcutaneous tissue.  The cyst was immediately apparent.  With one sharp dissection this was dissected free.  This was found to be a multiloculated cyst.  The cyst was removed and a small opening was present in the central portion of the tendon without significant destruction.  The corners of the joint were opened and debridement of the exostosis performed.  The joint was then copiously irrigated with saline.  Care was taken to protect the dorsal nail fold in resecting the tumor beneath the skin distally.  The skin was closed with interrupted 5-0 Nylon sutures.  A sterile compressive dressing and splint were applied.  The patient tolerated the procedure well and was taken to the recovery room for observation in satisfactory condition.  She is being discharged home to return to the [place] in one week, on Vicodin and Keflex. DD:  09/16/99 TD:  09/16/99 Job: [id number]

## 2010-11-29 LAB — POCT I-STAT, CHEM 8
BUN: 15
Calcium, Ion: 1.14
Chloride: 102
Creatinine, Ser: 1
Glucose, Bld: 118 — ABNORMAL HIGH
Potassium: 3.9

## 2010-11-29 LAB — CULTURE, BLOOD (ROUTINE X 2)
Culture: NO GROWTH
Culture: NO GROWTH

## 2010-11-29 LAB — URINE CULTURE

## 2010-11-29 LAB — MAGNESIUM: Magnesium: 2.5

## 2010-11-29 LAB — URINALYSIS, ROUTINE W REFLEX MICROSCOPIC
Glucose, UA: NEGATIVE
Hgb urine dipstick: NEGATIVE
pH: 8

## 2010-11-29 LAB — COMPREHENSIVE METABOLIC PANEL
ALT: 36 — ABNORMAL HIGH
AST: 26
Albumin: 3.5
Alkaline Phosphatase: 67
Alkaline Phosphatase: 71
BUN: 12
CO2: 26
Chloride: 106
Creatinine, Ser: 0.77
GFR calc Af Amer: >60
GFR calc non Af Amer: >60
Glucose, Bld: 106 — ABNORMAL HIGH
Glucose, Bld: 99
Sodium: 140
Total Bilirubin: 0.7
Total Bilirubin: 1.1
Total Protein: 6.8

## 2010-11-29 LAB — CBC
HCT: 39
HCT: 41.1
Hemoglobin: 12.9
Hemoglobin: 12.9
Hemoglobin: 14
MCHC: 33.7
MCHC: 34.1
MCV: 87.2
MCV: 88.6
Platelets: 362
RBC: 4.34
RBC: 4.71
RDW: 12.9
RDW: 13.3
WBC: 7.9

## 2010-11-29 LAB — DIFFERENTIAL
Basophils Relative: 0
Eosinophils Absolute: 0.1
Eosinophils Relative: 1
Monocytes Absolute: 0.5
Monocytes Relative: 5

## 2010-11-29 LAB — URINE MICROSCOPIC-ADD ON

## 2010-11-29 LAB — PROTIME-INR
INR: 1
Prothrombin Time: 13.7

## 2010-11-29 LAB — APTT: aPTT: 28

## 2011-03-01 HISTORY — PX: ESOPHAGEAL DILATION: SHX303

## 2011-05-25 ENCOUNTER — Encounter: Payer: Self-pay | Admitting: Internal Medicine

## 2011-06-03 ENCOUNTER — Encounter: Payer: Self-pay | Admitting: Internal Medicine

## 2011-06-27 ENCOUNTER — Encounter: Payer: Self-pay | Admitting: Internal Medicine

## 2011-06-27 ENCOUNTER — Ambulatory Visit (INDEPENDENT_AMBULATORY_CARE_PROVIDER_SITE_OTHER): Payer: Medicare Other | Admitting: Internal Medicine

## 2011-06-27 VITALS — BP 128/64 | HR 82 | Ht 66.0 in | Wt 230.0 lb

## 2011-06-27 DIAGNOSIS — R1310 Dysphagia, unspecified: Secondary | ICD-10-CM

## 2011-06-27 DIAGNOSIS — K219 Gastro-esophageal reflux disease without esophagitis: Secondary | ICD-10-CM

## 2011-06-27 DIAGNOSIS — R1314 Dysphagia, pharyngoesophageal phase: Secondary | ICD-10-CM

## 2011-06-27 DIAGNOSIS — Z1211 Encounter for screening for malignant neoplasm of colon: Secondary | ICD-10-CM

## 2011-06-27 DIAGNOSIS — R1319 Other dysphagia: Secondary | ICD-10-CM

## 2011-06-27 DIAGNOSIS — Z87892 Personal history of anaphylaxis: Secondary | ICD-10-CM

## 2011-06-27 MED ORDER — PEG-KCL-NACL-NASULF-NA ASC-C 100 G PO SOLR: 1.0000 | Freq: Once | ORAL | Status: DC

## 2011-06-27 NOTE — Progress Notes (Signed)
Subjective:    Patient ID: Ashley Parker, female    DOB: 1931-06-10, 78 y.o.   MRN: [id number] Self-referred  HPI This is a very pleasant elderly white woman here to discuss problems with dysphagia. For the past year she's had intermittent solid food dysphagia and dysphagia and when she takes more than one large capsule or pill. She is describing a suprasternal sticking point and says it happened a few times a month. She is no longer taking more than one capsule or pill at a time so that is better. She will notice that when she is eating steak or chicken at a restaurant and think she may not chew quite well physical go down slowly and briefly impact and require water or other liquid to make the food go down. She also has fairly frequent heartburn, especially at night several times a week or more. She had been taking TUMS at bedtime and sometimes in the middle night for relief. She recently saw her primary care provider, and he recommended she start Prilosec OTC before supper which she has just done in the past few days.  She drinks about 2 caffeinated beverages day and understand she she quit, she does not smoke. She understands she is obese and says weight is stable but recognizes the need to lose some now that might affect things. She has the ability to raise the head of her bed as it is oh to rise, but she sleeps on her abdomen.  Her first husband died from esophageal cancer which has increased her concern about her dysphagia.  Other problems include chronic episodic diarrhea, about 3 times a year for several days. When she had her colonoscopy in 2003 she had an active ileitis with erosions. A diagnosis of possible Crohn's disease was made but she's had this stable pattern without therapy for that over the years and she self treats with coconut macaroon cookies with relief.  The remainder of the GI review of systems is negative.  Allergies  Allergen Reactions  . Clarithromycin    Outpatient  Prescriptions Prior to Visit  Medication Sig Dispense Refill  . aspirin 81 MG tablet Take 81 mg by mouth daily.        . beta carotene 10000 UNIT capsule Take 10,000 Units by mouth daily.        . bimatoprost (LUMIGAN) 0.03 % ophthalmic drops 1 drop at bedtime.        . Flaxseed MISC 1,000 mg.       . lisinopril-hydrochlorothiazide (PRINZIDE,ZESTORETIC) 20-25 MG per tablet Take 1 tablet by mouth daily.  90 tablet  3  . LUTEIN PO Take 40 mg by mouth daily.       [name] MAGNESIUM CITRATE PO Take 225 mg by mouth.        . Omega-3 Fatty Acids (OMEGA 3 PO) Take by mouth.        [name] SOYA LECITHIN PO Take 1,200 mg by mouth.       . vitamin E 400 UNIT capsule Take 400 Units by mouth daily.        . Ascorbic Acid (VITAMIN C) 100 MG tablet Take 500 mg by mouth daily.       . Calcium Carbonate-Vitamin D (CALCIUM 600+D PO) Take by mouth 2 (two) times daily.       . Cholecalciferol (VITAMIN D-3) 5000 UNITS TABS Take by mouth.        . Garlic TABS Take by mouth.        [name]  Glucosamine-Chondroitin (GLUCOSAMINE CHONDR COMPLEX PO) Take by mouth.        . Multiple Vitamin (MULTIVITAMIN) capsule Take 1 capsule by mouth daily.        . vitamin B-12 (CYANOCOBALAMIN) 100 MCG tablet Take 50 mcg by mouth daily.         Past Medical History  Diagnosis Date  . Osteoporosis   . Hypertension   . Obesity, unspecified   . Unspecified vitamin D deficiency   . Diverticulosis   . Glaucoma   . Arthritis   . Macular degeneration   . Wrist fracture, left 1943, 1985  . Pelvic fracture 2007   Past Surgical History  Procedure Date  . Cataract extraction   . Total knee arthroplasty 2007    right  . Colonoscopy 07/27/2001  . Tonsillectomy and adenoidectomy    History   Social History  . Marital Status: Married    Spouse Name: N/A    Number of Children: 5  .     Occupational History  . Retired    Social History Main Topics  . Smoking status: Former Research scientist (life sciences)  . Smokeless tobacco: Never Used  . Alcohol Use: Yes      1 drink daily   . Drug Use: No  . Sexually Active: Not Currently          Social History Narrative   Regular exercise-yes - aqua aerobics, walking 1hr x 5 each weekMarried5 children, 20 grandchildren, worked in past as garden Building services engineer, Education officer, community, designerDaily caffeine x 2   Family History  Problem Relation Age of Onset  . Prostate cancer Father   . Breast cancer Mother   . Colon cancer Neg Hx   . Heart disease Mother       Review of Systems This is positive for arthritis pains, some decreased vision related to glaucoma and macular degeneration and some reduced hearing. All other review of systems negative at this time. Or as mentioned in the history of present illness.    Objective:   Physical Exam General:  Well-developed, well-nourished and in no acute distress - obese Eyes:  anicteric. ENT:   Mouth and posterior pharynx free of lesions. + dentures Neck:   supple w/o thyromegaly or mass.  Lungs: Clear to auscultation bilaterally. Heart:  S1S2, no rubs, murmurs, gallops. Abdomen:  obese, soft, non-tender, no hepatosplenomegaly, hernia, or mass and BS+.  Rectal: deferred Lymph:  no cervical or supraclavicular adenopathy. Extremities:   no edema Skin   no rash. Neuro:  A&O x 3.  Psych:  appropriate mood and  Affect.         Assessment & Plan:   1. Esophageal dysphagia   She will chew carefully in the meantime and we will evaluate with an upper GI endoscopy and possible esophageal dilation. It sounds like a peptic ring or stricture versus motility. The risks and benefits as well as alternatives of endoscopic procedure(s) have been discussed and reviewed. All questions answered. The patient agrees to proceed.    2. GERD (gastroesophageal reflux disease)   Agree with PPI at this time. Lifestyle measures discussed as well. She understands the need to reduce caffeine and lose weight.   3. Special screening for malignant neoplasms, colon   It is been 10 years  since the last colonoscopy sooner than appropriate time to repeat a screening colonoscopy which he desires to do. The risks and benefits as well as alternatives of endoscopic procedure(s) have been discussed and reviewed. All questions answered.  The patient agrees to proceed.  There is a remote history of isolated terminal ileitis of unclear etiology. She has episodes of diarrhea a few times a year. It seems unlikely that this is chronic inflammatory bowel disease as she's had a stable pattern rare and very self-limited diarrhea over a number of years. We'll attempt a look in the terminal ileum at colonoscopy    I appreciate the opportunity to care for this patient.   CC: Tivis Ringer, MD

## 2011-06-27 NOTE — Patient Instructions (Signed)
You have been scheduled for an endoscopy and colonoscopy. Please follow the written instructions given to you at your visit today. Please pick up your prep at the pharmacy within the next 1-3 days.  Gastroesophageal Reflux Disease, Adult Gastroesophageal reflux disease (GERD) happens when acid from your stomach flows up into the esophagus. When acid comes in contact with the esophagus, the acid causes soreness (inflammation) in the esophagus. Over time, GERD may create small holes (ulcers) in the lining of the esophagus. CAUSES   Increased body weight. This puts pressure on the stomach, making acid rise from the stomach into the esophagus.   Smoking. This increases acid production in the stomach.   Drinking alcohol. This causes decreased pressure in the lower esophageal sphincter (valve or ring of muscle between the esophagus and stomach), allowing acid from the stomach into the esophagus.   Late evening meals and a full stomach. This increases pressure and acid production in the stomach.   A malformed lower esophageal sphincter.  Sometimes, no cause is found. SYMPTOMS   Burning pain in the lower part of the mid-chest behind the breastbone and in the mid-stomach area. This may occur twice a week or more often.   Trouble swallowing.   Sore throat.   Dry cough.   Asthma-like symptoms including chest tightness, shortness of breath, or wheezing.  DIAGNOSIS  Your caregiver may be able to diagnose GERD based on your symptoms. In some cases, X-rays and other tests may be done to check for complications or to check the condition of your stomach and esophagus. TREATMENT  Your caregiver may recommend over-the-counter or prescription medicines to help decrease acid production. Ask your caregiver before starting or adding any new medicines.  HOME CARE INSTRUCTIONS   Change the factors that you can control. Ask your caregiver for guidance concerning weight loss, quitting smoking, and alcohol  consumption.   Avoid foods and drinks that make your symptoms worse, such as:   Caffeine or alcoholic drinks.   Chocolate.   Peppermint or mint flavorings.   Garlic and onions.   Spicy foods.   Citrus fruits, such as oranges, lemons, or limes.   Tomato-based foods such as sauce, chili, salsa, and pizza.   Fried and fatty foods.   Avoid lying down for the 3 hours prior to your bedtime or prior to taking a nap.   Eat small, frequent meals instead of large meals.   Wear loose-fitting clothing. Do not wear anything tight around your waist that causes pressure on your stomach.   Raise the head of your bed 6 to 8 inches with wood blocks to help you sleep. Extra pillows will not help.   Only take over-the-counter or prescription medicines for pain, discomfort, or fever as directed by your caregiver.   Do not take aspirin, ibuprofen, or other nonsteroidal anti-inflammatory drugs (NSAIDs).  SEEK IMMEDIATE MEDICAL CARE IF:   You have pain in your arms, neck, jaw, teeth, or back.   Your pain increases or changes in intensity or duration.   You develop nausea, vomiting, or sweating (diaphoresis).   You develop shortness of breath, or you faint.   Your vomit is green, yellow, black, or looks like coffee grounds or blood.   Your stool is red, bloody, or black.  These symptoms could be signs of other problems, such as heart disease, gastric bleeding, or esophageal bleeding. MAKE SURE YOU:   Understand these instructions.   Will watch your condition.   Will get help right away  if you are not doing well or get worse.  Document Released: 11/24/2004 Document Revised: 02/03/2011 Document Reviewed: 09/03/2010 [facility] Patient Information 2012 [place].

## 2011-07-08 ENCOUNTER — Ambulatory Visit (AMBULATORY_SURGERY_CENTER): Payer: Medicare Other | Admitting: Internal Medicine

## 2011-07-08 ENCOUNTER — Encounter: Payer: Self-pay | Admitting: Internal Medicine

## 2011-07-08 VITALS — BP 117/78 | HR 67 | Temp 96.2°F | Resp 20 | Ht 66.0 in | Wt 230.0 lb

## 2011-07-08 DIAGNOSIS — K222 Esophageal obstruction: Secondary | ICD-10-CM

## 2011-07-08 DIAGNOSIS — K529 Noninfective gastroenteritis and colitis, unspecified: Secondary | ICD-10-CM

## 2011-07-08 DIAGNOSIS — K449 Diaphragmatic hernia without obstruction or gangrene: Secondary | ICD-10-CM

## 2011-07-08 DIAGNOSIS — K649 Unspecified hemorrhoids: Secondary | ICD-10-CM

## 2011-07-08 DIAGNOSIS — Z1211 Encounter for screening for malignant neoplasm of colon: Secondary | ICD-10-CM

## 2011-07-08 DIAGNOSIS — D131 Benign neoplasm of stomach: Secondary | ICD-10-CM

## 2011-07-08 DIAGNOSIS — K5289 Other specified noninfective gastroenteritis and colitis: Secondary | ICD-10-CM

## 2011-07-08 DIAGNOSIS — K5730 Diverticulosis of large intestine without perforation or abscess without bleeding: Secondary | ICD-10-CM

## 2011-07-08 DIAGNOSIS — K219 Gastro-esophageal reflux disease without esophagitis: Secondary | ICD-10-CM

## 2011-07-08 DIAGNOSIS — R1314 Dysphagia, pharyngoesophageal phase: Secondary | ICD-10-CM

## 2011-07-08 DIAGNOSIS — D126 Benign neoplasm of colon, unspecified: Secondary | ICD-10-CM

## 2011-07-08 MED ORDER — SODIUM CHLORIDE 0.9 % IV SOLN: 500.0000 mL | INTRAVENOUS | Status: DC

## 2011-07-08 NOTE — Op Note (Signed)
[facility] [address]. [place], [place]  [id number]  COLONOSCOPY PROCEDURE REPORT  PATIENT:  Ashley Parker, Ashley Parker  MR#:  [id number] BIRTHDATE:  May 22, 1931, 80 yrs. old  GENDER:  female ENDOSCOPIST:  Gatha Mayer, MD, [facility]  PROCEDURE DATE:  07/08/2011 PROCEDURE:  Colonoscopy with biopsy ASA CLASS:  Class II INDICATIONS:  Routine Risk Screening MEDICATIONS:   There was residual sedation effect present from prior procedure., These medications were titrated to patient response per physician's verbal order, Fentanyl 25 mcg IV, Versed 5 mg IV  DESCRIPTION OF PROCEDURE:   After the risks benefits and alternatives of the procedure were thoroughly explained, informed consent was obtained.  Digital rectal exam was performed and revealed no abnormalities.   The LB PCF-H180AL [id number] endoscope was introduced through the anus and advanced to the terminal ileum which was intubated for a short distance, without limitations. The quality of the prep was excellent, using MoviPrep.  The instrument was then slowly withdrawn as the colon was fully examined. <<PROCEDUREIMAGES>>  FINDINGS:  Ulcers terminal ileum Multiple small ulcers, erosions and patchy nodular erythema. Multiple biopsies were obtained and sent to pathology.  A diminutive polyp was found in the sigmoid colon. The polyp was removed using cold biopsy forceps.  Moderate diverticulosis was found in the sigmoid colon.  This was otherwise a normal examination of the colon. Includes right colon retroflexion.   Retroflexed views in the rectum revealed internal and external hemorrhoids.    The time to cecum = 8:29 minutes. The scope was then withdrawn in 18:12 minutes from the cecum and the procedure completed. COMPLICATIONS:  None ENDOSCOPIC IMPRESSION: 1) Ulcers and inflammatory changes in the terminal ileum - biopsied - she may have chronic mild Crohn's ileitis as suspected in past 2) Diminutive polyp in the sigmoid colon -  removed 3) Moderate diverticulosis in the sigmoid colon 4) Internal and external hemorrhoids 5) Otherwise normal examination, excellent prep RECOMMENDATIONS: 1) Patient to call next week and schedule office visit for late May or early June to discuss ileitis findings REPEAT EXAM:  await biopsies to decide if needed  Gatha Mayer, MD, [facility]  CC:  Prince Solian, MD and The Patient  n. eSIGNED:   Gatha Mayer at 07/08/2011 10:11 AM  Ladean Raya, [phone]

## 2011-07-08 NOTE — Progress Notes (Signed)
Patient did not experience any of the following events: a burn prior to discharge; a fall within the facility; wrong site/side/patient/procedure/implant event; or a hospital transfer or hospital admission upon discharge from the facility. (G8907) Patient did not have preoperative order for IV antibiotic SSI prophylaxis. (G8918)  

## 2011-07-08 NOTE — Patient Instructions (Signed)
Discharge instructions given with verbal understanding. Handouts on polyps,diverticulosis and a dilatation diet given. Hiatal hernia given also. Resume previous medications.YOU HAD AN ENDOSCOPIC PROCEDURE TODAY AT [facility]: Refer to the procedure report that was given to you for any specific questions about what was found during the examination.  If the procedure report does not answer your questions, please call your gastroenterologist to clarify.  If you requested that your care partner not be given the details of your procedure findings, then the procedure report has been included in a sealed envelope for you to review at your convenience later.  YOU SHOULD EXPECT: Some feelings of bloating in the abdomen. Passage of more gas than usual.  Walking can help get rid of the air that was put into your GI tract during the procedure and reduce the bloating. If you had a lower endoscopy (such as a colonoscopy or flexible sigmoidoscopy) you may notice spotting of blood in your stool or on the toilet paper. If you underwent a bowel prep for your procedure, then you may not have a normal bowel movement for a few days.  DIET: Your first meal following the procedure should be a light meal and then it is ok to progress to your normal diet.  A half-sandwich or bowl of soup is an example of a good first meal.  Heavy or fried foods are harder to digest and may make you feel nauseous or bloated.  Likewise meals heavy in dairy and vegetables can cause extra gas to form and this can also increase the bloating.  Drink plenty of fluids but you should avoid alcoholic beverages for 24 hours.  ACTIVITY: Your care partner should take you home directly after the procedure.  You should plan to take it easy, moving slowly for the rest of the day.  You can resume normal activity the day after the procedure however you should NOT DRIVE or use heavy machinery for 24 hours (because of the sedation medicines used  during the test).    SYMPTOMS TO REPORT IMMEDIATELY: A gastroenterologist can be reached at any hour.  During normal business hours, 8:30 AM to 5:00 PM Monday through Friday, call [phone].  After hours and on weekends, please call the GI answering service at [phone] who will take a message and have the physician on call contact you.   Following lower endoscopy (colonoscopy or flexible sigmoidoscopy):  Excessive amounts of blood in the stool  Significant tenderness or worsening of abdominal pains  Swelling of the abdomen that is new, acute  Fever of 100F or higher  Following upper endoscopy (EGD)  Vomiting of blood or coffee ground material  New chest pain or pain under the shoulder blades  Painful or persistently difficult swallowing  New shortness of breath  Fever of 100F or higher  Black, tarry-looking stools  FOLLOW UP: If any biopsies were taken you will be contacted by phone or by letter within the next 1-3 weeks.  Call your gastroenterologist if you have not heard about the biopsies in 3 weeks.  Our staff will call the home number listed on your records the next business day following your procedure to check on you and address any questions or concerns that you may have at that time regarding the information given to you following your procedure. This is a courtesy call and so if there is no answer at the home number and we have not heard from you through the emergency physician on call,  we will assume that you have returned to your regular daily activities without incident.  SIGNATURES/CONFIDENTIALITY: You and/or your care partner have signed paperwork which will be entered into your electronic medical record.  These signatures attest to the fact that that the information above on your After Visit Summary has been reviewed and is understood.  Full responsibility of the confidentiality of this discharge information lies with you and/or your care-partner.

## 2011-07-08 NOTE — Op Note (Signed)
[facility] [address]. [place], [place]  [id number]  ENDOSCOPY PROCEDURE REPORT  PATIENT:  Ashley Parker, Reach  MR#:  [id number] BIRTHDATE:  Feb 22, 1932, 80 yrs. old  GENDER:  female  ENDOSCOPIST:  Gatha Mayer, MD, [facility]  PROCEDURE DATE:  07/08/2011 PROCEDURE:  EGD with biopsy, [address], Venia Minks Dilation of Esophagus ASA CLASS:  Class II INDICATIONS:  dysphagia  MEDICATIONS:   These medications were titrated to patient response per physician's verbal order, Fentanyl 50 mcg IV, Versed 5 mg IV TOPICAL ANESTHETIC:  DESCRIPTION OF PROCEDURE:   After the risks benefits and alternatives of the procedure were thoroughly explained, informed consent was obtained.  The LB GIF-H180 [id number] endoscope was introduced through the mouth and advanced to the second portion of the duodenum, without limitations.  The instrument was slowly withdrawn as the mucosa was fully examined. <<PROCEDUREIMAGES>>  A stricture was found in the distal esophagus. Ring-like, smooth stricture with 16 mm diameter. No inflammatory changes.  A hiatal hernia was found. It was 2 cm in size. 34-36 cm.  A sessile polyp was found in the body of the stomach. 6-7 mm. Multiple biopsies were obtained and sent to pathology.  Otherwise the examination was normal.    Retroflexed views also revealed a hiatal hernia. The scope was then withdrawn from the patient and the procedure completed.  COMPLICATIONS:  None  ENDOSCOPIC IMPRESSION: 1) Stricture in the distal esophagus - dilated to 54 french 2) 2 cm hiatal hernia 3) Sessile polyp in the body of the stomach- biopsied - looks like insignificant fundic gland polyp 4) Otherwise normal examination RECOMMENDATIONS: 1) post-dilation diet today 2) stay on omeprazole (Prilosec) to reduce chance of recurrent swallowing problems 3) Colonoscopy next  Gatha Mayer, MD, [facility]  CC:  Prince Solian, MD and The Patient  n. Lorrin Mais:   Gatha Mayer at 07/08/2011 10:01  AM  Ladean Raya, [phone]

## 2011-07-11 ENCOUNTER — Telehealth: Payer: Self-pay

## 2011-07-11 NOTE — Telephone Encounter (Signed)
  Follow up Call-  Call back number 07/08/2011  Post procedure Call Back phone  # [phone]  Permission to leave phone message Yes     Patient questions:  Do you have a fever, pain , or abdominal swelling? no Pain Score  0 *  Have you tolerated food without any problems? yes  Have you been able to return to your normal activities? yes  Do you have any questions about your discharge instructions: Diet   no Medications  no Follow up visit  no  Do you have questions or concerns about your Care? no  Actions: * If pain score is 4 or above: No action needed, pain <4.

## 2011-07-15 ENCOUNTER — Encounter: Payer: Self-pay | Admitting: Internal Medicine

## 2011-07-15 NOTE — Progress Notes (Signed)
Quick Note:  Hyperplastic colon polyp Fundic gland polyp Chronic active ileitis  No recall ______

## 2011-07-29 ENCOUNTER — Ambulatory Visit: Payer: Medicare Other | Admitting: Internal Medicine

## 2011-08-23 ENCOUNTER — Encounter: Payer: Self-pay | Admitting: Internal Medicine

## 2011-08-23 ENCOUNTER — Ambulatory Visit (INDEPENDENT_AMBULATORY_CARE_PROVIDER_SITE_OTHER): Payer: Medicare Other | Admitting: Internal Medicine

## 2011-08-23 VITALS — BP 128/64 | HR 60 | Ht 66.0 in | Wt 229.0 lb

## 2011-08-23 DIAGNOSIS — K5000 Crohn's disease of small intestine without complications: Secondary | ICD-10-CM | POA: Insufficient documentation

## 2011-08-23 DIAGNOSIS — K222 Esophageal obstruction: Secondary | ICD-10-CM | POA: Insufficient documentation

## 2011-08-23 DIAGNOSIS — K219 Gastro-esophageal reflux disease without esophagitis: Secondary | ICD-10-CM

## 2011-08-23 NOTE — Assessment & Plan Note (Signed)
Currently having some diarrhea for a few days, similar to her intermittent episodes that she has had over the years. It has been more than a year since her last episode. We discussed the nature of Crohn's disease and the possible complications including obstruction, fistulization and other problems including chronic diarrhea and abdominal pain. She has not had any of those. She will observe, and call if she does not resolve with her current problems, and the plan will be observation and symptomatic treatment at this point. I would like to see her in a year routinely, sooner if needed.

## 2011-08-23 NOTE — Assessment & Plan Note (Signed)
Last dilated may 2013. Doing well, on maintenance PPI also.

## 2011-08-23 NOTE — Patient Instructions (Addendum)
Let us know if you are having problems and need help with your gastroenterology problems before your next routine visit (1 year).  If you need help or have questions call our office and leave a message with Dr. Celesta Aver nurse. Please read the information about Crohn's disease. [email or web address] is an excellent website also.

## 2011-08-23 NOTE — Progress Notes (Signed)
Patient ID: Ashley Parker, female   DOB: Nov 18, 1931, 78 y.o.   MRN: [id number]  The patient presents for followup after she was found to have chronic active ileitis on colonoscopy. She has had this before. She has chronic episodic diarrhea. She is currently having some diarrhea though it's been a years and she had problems. Otherwise she does well. She takes coconut macaroon's and over-the-counter antidiarrheals to control her symptoms and feels that works fine. For the past the chart he of time she is asymptomatic.  Her dysphagia is resolved after esophageal dilation, and she is on PPI.  Allergies  Allergen Reactions  . Clarithromycin    Outpatient Prescriptions Prior to Visit  Medication Sig Dispense Refill  . AMBULATORY NON FORMULARY MEDICATION Tumeric 500 mg Takes as directed      . aspirin 81 MG tablet Take 81 mg by mouth daily.        Floria Raveling, Vaccinium myrtillus, (BILBERRY PO) Take 470 mg by mouth daily.      . bimatoprost (LUMIGAN) 0.03 % ophthalmic drops 1 drop at bedtime.        . Biotin 1000 MCG tablet Take 1,000 mcg by mouth daily.      . Calcium Citrate-Vitamin D 500-400 MG-UNIT CHEW Chew by mouth 3 (three) times daily.      . Cholecalciferol (VITAMIN D-3 PO) Take by mouth. Takes 5000 by mouth once daily      . DORZOLAMIDE HCL-TIMOLOL MAL OP Apply to eye as directed.      . Flaxseed MISC 1,000 mg.       . GREEN COFFEE BEAN PO Take by mouth 2 (two) times daily.      [name] lisinopril-hydrochlorothiazide (PRINZIDE,ZESTORETIC) 20-25 MG per tablet Take 1 tablet by mouth daily.  90 tablet  3  . LUTEIN PO Take 40 mg by mouth daily.       [name] MAGNESIUM CITRATE PO Take 225 mg by mouth.        . Omega-3 Fatty Acids (OMEGA 3 PO) Take by mouth.        [name] omeprazole (PRILOSEC OTC) 20 MG tablet Take 20 mg by mouth daily before supper.      . Potassium 99 MG TABS Take by mouth daily.      [name] SOYA LECITHIN PO Take 1,200 mg by mouth.       . vitamin A 10000 UNIT capsule Take 10,000 Units by  mouth daily.      . vitamin E 400 UNIT capsule Take 400 Units by mouth daily.        . beta carotene 10000 UNIT capsule Take 10,000 Units by mouth daily.         Past Medical History  Diagnosis Date  . Osteoporosis   . Hypertension   . Obesity, unspecified   . Unspecified vitamin D deficiency   . Glaucoma   . Arthritis   . Macular degeneration   . Wrist fracture, left 1943, 1985  . Pelvic fracture 2007  . Hemorrhoids     Internal and External  . Stricture of esophagus     distal  . Fundic Gland Polyposis Of Stomach   . GERD (gastroesophageal reflux disease)   . Crohn's ileitis     Mild, mostly asymptomatic, not on therapy   Past Surgical History  Procedure Date  . Cataract extraction   . Total knee arthroplasty 2007    left  . Colonoscopy 07/27/2001, 07/08/2011    Multiple  . Tonsillectomy and adenoidectomy   .  Esophagogastroduodenoscopy     multiple   History   Social History  . Marital Status: Married    Spouse Name: N/A    Number of Children: 5  .     Occupational History  . Retired    Social History Main Topics  . Smoking status: Former Research scientist (life sciences)  . Smokeless tobacco: Never Used  . Alcohol Use: Yes     1 drink daily   . Drug Use: No  .            Social History Narrative   Regular exercise-yes - aqua aerobics, walking 1hr x 5 each weekMarried5 children, 22 grandchildren, worked in past as garden Building services engineer, Education officer, community, designerDaily caffeine x 2   Family History  Problem Relation Age of Onset  . Prostate cancer Father   . Breast cancer Mother   . Heart disease Mother   . Uterine cancer Mother   . Colon cancer Neg Hx    Assessment and plan:  1. Crohn's ileitis   2. GERD with stricture      CC: Tivis Ringer, MD

## 2011-09-07 ENCOUNTER — Telehealth: Payer: Self-pay | Admitting: Internal Medicine

## 2011-09-07 DIAGNOSIS — R197 Diarrhea, unspecified: Secondary | ICD-10-CM

## 2011-09-07 NOTE — Telephone Encounter (Signed)
He reports wife still having diarrhea.No fever, no sig abd pain.  I advised that could be from her Crohn's or possibly an infection.  I have recommended OTC Imodium prn and told him we will call her and arrange for stool culture and C diff PCR and then call results and plans

## 2011-09-07 NOTE — Telephone Encounter (Signed)
Patient advised.  She will come pick up the containers today.

## 2011-09-08 ENCOUNTER — Other Ambulatory Visit: Payer: Medicare Other

## 2011-09-08 DIAGNOSIS — R197 Diarrhea, unspecified: Secondary | ICD-10-CM

## 2011-09-12 ENCOUNTER — Other Ambulatory Visit: Payer: Self-pay

## 2011-09-12 LAB — STOOL CULTURE

## 2011-09-12 MED ORDER — BUDESONIDE 3 MG PO CP24: 9.0000 mg | ORAL_CAPSULE | Freq: Every day | ORAL | Status: DC

## 2011-09-12 NOTE — Progress Notes (Signed)
Quick Note:  Does not have a GI infection I believe diarrhea coming from Crohn's disease Can start treatment with Entocort EC 9 mg daily #90 1 refill - if too expensive then let us know and can try something else Follow-up me 1 month - sooner if needed or if she wants to discuss treatment further ______

## 2011-09-15 ENCOUNTER — Telehealth: Payer: Self-pay | Admitting: Internal Medicine

## 2011-09-15 NOTE — Telephone Encounter (Signed)
Patient advised.  She will make an appt and have them send [place] the results

## 2011-09-15 NOTE — Telephone Encounter (Signed)
I think its ok with htn - need to follow BP  I want her to go to eye doctor and tell them she is on it and get eye pressure checked and let me know what they say

## 2011-09-15 NOTE — Telephone Encounter (Signed)
Patient has a question about taking Entocort.  Her insert says avoid Entocort with HTN and glaucoma.  Dr. Carlean Purl is it ok for her to continue to take?

## 2011-10-06 ENCOUNTER — Encounter: Payer: Self-pay | Admitting: Internal Medicine

## 2011-10-06 ENCOUNTER — Ambulatory Visit (INDEPENDENT_AMBULATORY_CARE_PROVIDER_SITE_OTHER): Payer: Medicare Other | Admitting: Internal Medicine

## 2011-10-06 VITALS — BP 124/72 | HR 64 | Ht 65.5 in | Wt 228.0 lb

## 2011-10-06 DIAGNOSIS — K5000 Crohn's disease of small intestine without complications: Secondary | ICD-10-CM

## 2011-10-06 MED ORDER — BUDESONIDE 3 MG PO CP24: 9.0000 mg | ORAL_CAPSULE | Freq: Every day | ORAL | Status: DC

## 2011-10-06 NOTE — Patient Instructions (Addendum)
    Please take your Entocort EC for 8 weeks and then stop.  Call us if symptoms worsen or fail to improve.   Dr. Carlean Purl would like for you to come back in January 2014 for an office visit.    Thank you for choosing me and [facility].  Gatha Mayer, [name], [facility]

## 2011-10-06 NOTE — Progress Notes (Signed)
Patient ID: Ashley Parker, female   DOB: [birth year], 78 y.o.   MRN: [id number]  The patient returns for followup of Crohn's ileitis. She has had intermittent episodes over the years but after a recent colonoscopy had a more prolonged course of diarrhea. She has never been on Crohn's specific therapy. She has eaten cannot macaroon cookies over time with reported resolution of diarrhea. Because of the prolonged course, Entocort EC 9 mg daily was prescribed. She has been on that for about 3 weeks or slightly longer and feels well. There is no diarrhea. No abdominal pain. Her blood pressure has been fine, she has not gained weight, her glaucoma is okay, her I pressure has been checked and are no problems. Medications, allergies, past medical history, past surgical history, family history and social history are reviewed and updated in the EMR.   Physical exam:  He is in no acute distress. Vitals above.   Assessment and plan:  1. Crohn's ileitis    1. This is significantly improved. She will continue Entocort EC 9 mg daily for a total of 8 weeks and stopped. 2. I will see her back in about 6 months just to see how she's been doing regarding symptoms. She is not interested in daily therapy, and I think that is okay given the overall indolent nature of her disease though the possibility of complications exists and she is aware of that this is our plan for now.  I appreciate the opportunity to care for this patient.   CC: Tivis Ringer, MD

## 2011-10-10 ENCOUNTER — Telehealth: Payer: Self-pay

## 2011-10-10 MED ORDER — BUDESONIDE 3 MG PO CP24: 9.0000 mg | ORAL_CAPSULE | Freq: Every day | ORAL | Status: AC

## 2011-10-10 NOTE — Telephone Encounter (Signed)
Patient was seen in the office and advised to take entocort 9 mg a day for 8 weeks.  Dr. Carlean Purl please clarify if this is an additional 8 weeks or 8 weeks from when it was prescribed. There is a discrepancy from your note to the AVS This has put her in the donut hole and will drive the price of several other drugs up.  She is wondering if she can stop it now.  I have provided her with samples until you return next week to review and advise.

## 2011-10-12 NOTE — Telephone Encounter (Signed)
Minimum of 6 weeks therapy total please

## 2011-10-12 NOTE — Telephone Encounter (Signed)
She will finish up the samples provided and this will be a little over 6 weeks.  She will call back for any questions or concerns after she stops entocort

## 2012-01-20 ENCOUNTER — Encounter: Payer: Self-pay | Admitting: Internal Medicine

## 2012-07-31 ENCOUNTER — Encounter: Payer: Self-pay | Admitting: Internal Medicine

## 2012-08-29 ENCOUNTER — Ambulatory Visit (INDEPENDENT_AMBULATORY_CARE_PROVIDER_SITE_OTHER): Payer: Medicare Other | Admitting: Internal Medicine

## 2012-08-29 ENCOUNTER — Encounter: Payer: Self-pay | Admitting: Internal Medicine

## 2012-08-29 VITALS — BP 132/80 | HR 60 | Ht 65.5 in | Wt 229.4 lb

## 2012-08-29 DIAGNOSIS — K5000 Crohn's disease of small intestine without complications: Secondary | ICD-10-CM

## 2012-08-29 NOTE — Assessment & Plan Note (Signed)
She has a stable pattern of mild GI disturbance it's intermittent and is not disturbing her quality of life that much. She does not think budesonide made much difference when prescribed last year. She will continue observation mode I will see her in 1 year.

## 2012-08-29 NOTE — Progress Notes (Signed)
  Subjective:    Patient ID: Ashley Parker, female    DOB: 06-Nov-1931, 79 y.o.   MRN: [id number]  HPI The patient returns and she has a stable pattern of episodic intermittent diarrhea this is short-lived, sometimes requiring antidiarrheals such as loperamide. No new problems. She is somewhat concerned because she read where people do die from complications of Crohn's disease. We talked about that and how those patients have more complicated disease and she does.   Medications, allergies, past medical history, past surgical history, family history and social history are reviewed and updated in the EMR.  Review of Systems  as above    Objective:   Physical ExamObese elderly woman in no acute distress     Assessment & Plan:   1. Crohn's ileitis, without complications

## 2012-08-29 NOTE — Patient Instructions (Addendum)
I will see you in 1 year - sooner if needed as we discussed.  I appreciate the opportunity to care for you. Gatha Mayer, MD, [facility]

## 2013-06-21 ENCOUNTER — Other Ambulatory Visit: Payer: Self-pay | Admitting: Obstetrics and Gynecology

## 2013-06-21 DIAGNOSIS — R928 Other abnormal and inconclusive findings on diagnostic imaging of breast: Secondary | ICD-10-CM

## 2013-07-02 ENCOUNTER — Encounter (INDEPENDENT_AMBULATORY_CARE_PROVIDER_SITE_OTHER): Payer: Self-pay

## 2013-07-02 ENCOUNTER — Ambulatory Visit
Admission: RE | Admit: 2013-07-02 | Discharge: 2013-07-02 | Disposition: A | Discharge status: Home / Self Care | Payer: Medicare Other | Source: Ambulatory Visit | Attending: Obstetrics and Gynecology | Admitting: Obstetrics and Gynecology

## 2013-07-02 DIAGNOSIS — R928 Other abnormal and inconclusive findings on diagnostic imaging of breast: Secondary | ICD-10-CM

## 2013-10-31 ENCOUNTER — Ambulatory Visit (HOSPITAL_COMMUNITY): Payer: Medicare Other | Attending: Cardiology | Admitting: Radiology

## 2013-10-31 ENCOUNTER — Other Ambulatory Visit (HOSPITAL_COMMUNITY): Payer: Self-pay | Admitting: Internal Medicine

## 2013-10-31 DIAGNOSIS — I10 Essential (primary) hypertension: Secondary | ICD-10-CM | POA: Diagnosis not present

## 2013-10-31 DIAGNOSIS — R42 Dizziness and giddiness: Secondary | ICD-10-CM

## 2013-10-31 DIAGNOSIS — E669 Obesity, unspecified: Secondary | ICD-10-CM | POA: Insufficient documentation

## 2013-10-31 DIAGNOSIS — Z87891 Personal history of nicotine dependence: Secondary | ICD-10-CM | POA: Insufficient documentation

## 2013-10-31 NOTE — Progress Notes (Signed)
Echocardiogram performed.  

## 2014-02-18 ENCOUNTER — Inpatient Hospital Stay (HOSPITAL_COMMUNITY)
Admission: EM | Admit: 2014-02-18 | Discharge: 2014-02-20 | DRG: 339 | Disposition: A | Discharge status: Home / Self Care | Payer: Medicare Other | Attending: General Surgery | Admitting: General Surgery

## 2014-02-18 ENCOUNTER — Emergency Department (HOSPITAL_COMMUNITY): Payer: Medicare Other

## 2014-02-18 ENCOUNTER — Encounter (HOSPITAL_COMMUNITY): Payer: Self-pay | Admitting: Emergency Medicine

## 2014-02-18 DIAGNOSIS — H3530 Unspecified macular degeneration: Secondary | ICD-10-CM | POA: Diagnosis present

## 2014-02-18 DIAGNOSIS — M1990 Unspecified osteoarthritis, unspecified site: Secondary | ICD-10-CM | POA: Diagnosis present

## 2014-02-18 DIAGNOSIS — E559 Vitamin D deficiency, unspecified: Secondary | ICD-10-CM | POA: Diagnosis present

## 2014-02-18 DIAGNOSIS — M810 Age-related osteoporosis without current pathological fracture: Secondary | ICD-10-CM | POA: Diagnosis present

## 2014-02-18 DIAGNOSIS — K219 Gastro-esophageal reflux disease without esophagitis: Secondary | ICD-10-CM | POA: Diagnosis present

## 2014-02-18 DIAGNOSIS — K5000 Crohn's disease of small intestine without complications: Secondary | ICD-10-CM | POA: Diagnosis present

## 2014-02-18 DIAGNOSIS — K3580 Unspecified acute appendicitis: Secondary | ICD-10-CM | POA: Diagnosis present

## 2014-02-18 DIAGNOSIS — E669 Obesity, unspecified: Secondary | ICD-10-CM | POA: Diagnosis present

## 2014-02-18 DIAGNOSIS — Z87891 Personal history of nicotine dependence: Secondary | ICD-10-CM

## 2014-02-18 DIAGNOSIS — I10 Essential (primary) hypertension: Secondary | ICD-10-CM | POA: Diagnosis present

## 2014-02-18 DIAGNOSIS — H409 Unspecified glaucoma: Secondary | ICD-10-CM | POA: Diagnosis present

## 2014-02-18 DIAGNOSIS — K353 Acute appendicitis with localized peritonitis: Principal | ICD-10-CM | POA: Diagnosis present

## 2014-02-18 DIAGNOSIS — R1031 Right lower quadrant pain: Secondary | ICD-10-CM | POA: Diagnosis present

## 2014-02-18 LAB — BASIC METABOLIC PANEL
Anion gap: 7 (ref 5–15)
BUN: 11 mg/dL (ref 6–23)
CHLORIDE: 105 meq/L (ref 96–112)
CO2: 25 mmol/L (ref 19–32)
CREATININE: 0.7 mg/dL (ref 0.50–1.10)
Calcium: 9.5 mg/dL (ref 8.4–10.5)
GFR, EST NON AFRICAN AMERICAN: 80 mL/min — AB (ref >90)
GLUCOSE: 130 mg/dL — AB (ref 70–99)
POTASSIUM: 3.7 mmol/L (ref 3.5–5.1)
Sodium: 137 mmol/L (ref 135–145)

## 2014-02-18 LAB — HEPATIC FUNCTION PANEL
ALBUMIN: 4 g/dL (ref 3.5–5.2)
ALT: 80 U/L — ABNORMAL HIGH (ref 0–35)
AST: 59 U/L — AB (ref 0–37)
Alkaline Phosphatase: 78 U/L (ref 39–117)
BILIRUBIN TOTAL: 1.8 mg/dL — AB (ref 0.3–1.2)
Bilirubin, Direct: 0.4 mg/dL — ABNORMAL HIGH (ref 0.0–0.3)
Indirect Bilirubin: 1.4 mg/dL — ABNORMAL HIGH (ref 0.3–0.9)
Total Protein: 7.2 g/dL (ref 6.0–8.3)

## 2014-02-18 LAB — CBC WITH DIFFERENTIAL/PLATELET
BASOS PCT: 0 % (ref 0–1)
Basophils Absolute: 0 10*3/uL (ref 0.0–0.1)
EOS ABS: 0.1 10*3/uL (ref 0.0–0.7)
Eosinophils Relative: 1 % (ref 0–5)
HCT: 41.8 % (ref 36.0–46.0)
HEMOGLOBIN: 14.1 g/dL (ref 12.0–15.0)
Lymphocytes Relative: 12 % (ref 12–46)
Lymphs Abs: 1.7 10*3/uL (ref 0.7–4.0)
MCH: 30 pg (ref 26.0–34.0)
MCHC: 33.7 g/dL (ref 30.0–36.0)
MCV: 88.9 fL (ref 78.0–100.0)
Monocytes Absolute: 0.7 10*3/uL (ref 0.1–1.0)
Monocytes Relative: 5 % (ref 3–12)
Neutro Abs: 11.7 10*3/uL — ABNORMAL HIGH (ref 1.7–7.7)
Neutrophils Relative %: 82 % — ABNORMAL HIGH (ref 43–77)
Platelets: 320 10*3/uL (ref 150–400)
RBC: 4.7 MIL/uL (ref 3.87–5.11)
RDW: 13.3 % (ref 11.5–15.5)
WBC: 14.3 10*3/uL — ABNORMAL HIGH (ref 4.0–10.5)

## 2014-02-18 LAB — URINALYSIS, ROUTINE W REFLEX MICROSCOPIC
Bilirubin Urine: NEGATIVE
GLUCOSE, UA: NEGATIVE mg/dL
Hgb urine dipstick: NEGATIVE
KETONES UR: NEGATIVE mg/dL
Nitrite: NEGATIVE
PROTEIN: NEGATIVE mg/dL
Specific Gravity, Urine: 1.02 (ref 1.005–1.030)
Urobilinogen, UA: 1 mg/dL (ref 0.0–1.0)
pH: 6 (ref 5.0–8.0)

## 2014-02-18 LAB — LIPASE, BLOOD: Lipase: 23 U/L (ref 11–59)

## 2014-02-18 LAB — URINE MICROSCOPIC-ADD ON

## 2014-02-18 MED ORDER — KCL IN DEXTROSE-NACL 20-5-0.9 MEQ/L-%-% IV SOLN
INTRAVENOUS | Status: DC
  Administered 2014-02-19 – 2014-02-20 (×3): via INTRAVENOUS
  Filled 2014-02-18 (×5): qty 1000

## 2014-02-18 MED ORDER — PIPERACILLIN-TAZOBACTAM 3.375 G IVPB
3.3750 g | Freq: Three times a day (TID) | INTRAVENOUS | Status: DC
  Administered 2014-02-19: 3.375 mg via INTRAVENOUS
  Administered 2014-02-19 (×2): 3.375 g via INTRAVENOUS
  Administered 2014-02-19: 60 mg via INTRAVENOUS
  Administered 2014-02-19 – 2014-02-20 (×2): 3.375 g via INTRAVENOUS
  Filled 2014-02-18 (×6): qty 50

## 2014-02-18 MED ORDER — IOHEXOL 300 MG/ML  SOLN
100.0000 mL | Freq: Once | INTRAMUSCULAR | Status: AC | PRN
  Administered 2014-02-18: 100 mL via INTRAVENOUS

## 2014-02-18 MED ORDER — MORPHINE SULFATE 4 MG/ML IJ SOLN
4.0000 mg | Freq: Once | INTRAMUSCULAR | Status: AC
  Administered 2014-02-18: 4 mg via INTRAVENOUS
  Filled 2014-02-18: qty 1

## 2014-02-18 MED ORDER — MORPHINE SULFATE 2 MG/ML IJ SOLN
2.0000 mg | INTRAMUSCULAR | Status: DC | PRN
  Administered 2014-02-18 – 2014-02-19 (×2): 2 mg via INTRAVENOUS
  Filled 2014-02-18 (×2): qty 1

## 2014-02-18 MED ORDER — SODIUM CHLORIDE 0.9 % IV BOLUS (SEPSIS)
1000.0000 mL | Freq: Once | INTRAVENOUS | Status: AC
  Administered 2014-02-18: 1000 mL via INTRAVENOUS

## 2014-02-18 MED ORDER — ONDANSETRON HCL 4 MG/2ML IJ SOLN: 4.0000 mg | Freq: Four times a day (QID) | INTRAMUSCULAR | Status: DC | PRN

## 2014-02-18 MED ORDER — IOHEXOL 300 MG/ML  SOLN
25.0000 mL | INTRAMUSCULAR | Status: AC
  Administered 2014-02-18: 25 mL via ORAL

## 2014-02-18 MED ORDER — PIPERACILLIN-TAZOBACTAM 3.375 G IVPB 30 MIN
3.3750 g | Freq: Once | INTRAVENOUS | Status: AC
  Administered 2014-02-18: 3.375 g via INTRAVENOUS
  Filled 2014-02-18: qty 50

## 2014-02-18 MED ORDER — PANTOPRAZOLE SODIUM 40 MG IV SOLR
40.0000 mg | Freq: Every day | INTRAVENOUS | Status: DC
  Administered 2014-02-19 (×2): 40 mg via INTRAVENOUS
  Filled 2014-02-18 (×3): qty 40

## 2014-02-18 NOTE — ED Provider Notes (Signed)
CSN: [id number]     Arrival date & time 02/18/14  1340 History   First MD Initiated Contact with Patient 02/18/14 1541     Chief Complaint  Patient presents with  . Abdominal Pain     (Consider location/radiation/quality/duration/timing/severity/associated sxs/prior Treatment) HPI  80 year old female presents with right lower quadrant abdominal pain since yesterday. She was playing bridge when it all of a sudden started. Pain was worse yesterday and she was doubled over. The pain has been constant since yesterday but seems to be somewhat better today. Felt chills and subjective fever yesterday and had a temperature of 100 at her doctor's office this morning. Was sent over here to rule out appendicitis. No nausea, vomiting, or diarrhea. No back pain. Her urine has had a strong smell and has been darker but no dysuria or hematuria. Has never had pain like this before. Rates her pain is moderate. Currently while sitting she denies pain unless she moves. No trauma.  Past Medical History  Diagnosis Date  . Osteoporosis   . Hypertension   . Obesity, unspecified   . Unspecified vitamin D deficiency   . Glaucoma   . Arthritis   . Macular degeneration   . Wrist fracture, left 1943, 1985  . Pelvic fracture 2007  . Hemorrhoids     Internal and External  . Stricture of esophagus     distal  . Fundic gland polyposis of stomach   . GERD (gastroesophageal reflux disease)   . Crohn's ileitis     Mild, mostly asymptomatic, not on therapy   Past Surgical History  Procedure Laterality Date  . Cataract extraction    . Total knee arthroplasty Left 2007  . Colonoscopy  07/27/2001, 07/08/2011    Multiple  . Tonsillectomy and adenoidectomy    . Esophagogastroduodenoscopy      multiple   Family History  Problem Relation Age of Onset  . Prostate cancer Father   . Breast cancer Mother   . Heart disease Mother   . Uterine cancer Mother   . Colon cancer Neg Hx    History  Substance Use Topics   . Smoking status: Former Research scientist (life sciences)  . Smokeless tobacco: Never Used  . Alcohol Use: Yes     Comment: 1 drink daily    OB History    No data available     Review of Systems  Constitutional: Positive for fever and chills.  Gastrointestinal: Positive for abdominal pain. Negative for nausea and vomiting.  Genitourinary: Negative for dysuria.       Urine is darker and with an odor, no pain  Musculoskeletal: Negative for back pain.  All other systems reviewed and are negative.     Allergies  Biaxin  Home Medications   Prior to Admission medications   Medication Sig Start Date End Date Taking? Authorizing Provider  AMBULATORY NON FORMULARY MEDICATION Tumeric 500 mg Takes as directed    Historical Provider, MD  AMBULATORY NON FORMULARY MEDICATION Reservatrol drops for eyes OTC Takes as directed    Historical Provider, MD  aspirin 81 MG tablet Take 81 mg by mouth daily.      Historical Provider, MD  Bilberry, Vaccinium myrtillus, (BILBERRY PO) Take 470 mg by mouth daily.    Historical Provider, MD  bimatoprost (LUMIGAN) 0.03 % ophthalmic drops 1 drop at bedtime.      Historical Provider, MD  Calcium Citrate-Vitamin D 500-400 MG-UNIT CHEW Chew by mouth 3 (three) times daily.    Historical Provider, MD  Cholecalciferol (  VITAMIN D-3 PO) Take by mouth. Takes 5000 by mouth once daily    Historical Provider, MD  DORZOLAMIDE HCL-TIMOLOL MAL OP Apply to eye as directed.    Historical Provider, MD  Flaxseed MISC 1,000 mg.     Historical Provider, MD  lisinopril-hydrochlorothiazide (PRINZIDE,ZESTORETIC) 20-25 MG per tablet Take 1 tablet by mouth daily. 06/18/10   Janith Lima, MD  LUTEIN PO Take 40 mg by mouth daily.     Historical Provider, MD  Omega-3 Fatty Acids (OMEGA 3 PO) Take by mouth.      Historical Provider, MD  omeprazole (PRILOSEC OTC) 20 MG tablet Take 20 mg by mouth daily before supper. 06/24/11   Historical Provider, MD  vitamin E 400 UNIT capsule Take 400 Units by mouth daily.       Historical Provider, MD   BP 138/67 mmHg  Pulse 80  Temp(Src) 99 F (37.2 C) (Oral)  Resp 18  Ht 5' 6"  (1.676 m)  Wt 220 lb (99.791 kg)  BMI 35.53 kg/m2  SpO2 96% Physical Exam  Constitutional: She is oriented to person, place, and time. She appears well-developed and well-nourished. No distress.  HENT:  Head: Normocephalic and atraumatic.  Right Ear: External ear normal.  Left Ear: External ear normal.  Nose: Nose normal.  Eyes: Right eye exhibits no discharge. Left eye exhibits no discharge.  Cardiovascular: Normal rate, regular rhythm and normal heart sounds.   Pulmonary/Chest: Effort normal and breath sounds normal.  Abdominal: Soft. There is tenderness in the right upper quadrant and right lower quadrant.  Neurological: She is alert and oriented to person, place, and time.  Skin: Skin is warm and dry. She is not diaphoretic.  Nursing note and vitals reviewed.   ED Course  Procedures (including critical care time) Labs Review Labs Reviewed  BASIC METABOLIC PANEL - Abnormal; Notable for the following:    Glucose, Bld 130 (*)    GFR calc non Af Amer 80 (*)    All other components within normal limits  CBC WITH DIFFERENTIAL - Abnormal; Notable for the following:    WBC 14.3 (*)    Neutrophils Relative % 82 (*)    Neutro Abs 11.7 (*)    All other components within normal limits  LIPASE, BLOOD  URINALYSIS, ROUTINE W REFLEX MICROSCOPIC  HEPATIC FUNCTION PANEL    Imaging Review Ct Abdomen Pelvis W Contrast  02/18/2014   CLINICAL DATA:  Right lower quadrant abdominal pain since yesterday  EXAM: CT ABDOMEN AND PELVIS WITH CONTRAST  TECHNIQUE: Multidetector CT imaging of the abdomen and pelvis was performed using the standard protocol following bolus administration of intravenous contrast.  CONTRAST:  176m OMNIPAQUE IOHEXOL 300 MG/ML  SOLN  COMPARISON:  None.  FINDINGS: Minimal dependent subpleural atelectasis or scarring is noted at the lung bases.  Hepatic hypodensity  is most compatible with steatosis, without focal abnormality. Dependent sludge or stones noted within the gallbladder which is otherwise unremarkable. Nitrogen within gallstones incidentally identified image 29. No intra or extrahepatic ductal dilatation. No pancreatic ductal dilatation. The pancreas is partly fatty infiltrated. 1.3 cm hypodense left adrenal mass identified image 28. Right adrenal gland is unremarkable. Small gastrohepatic node measuring 0.9 cm identified image 22. Renal cortical cysts are noted, largest 6.1 cm right upper renal pole image 39. Right adrenal glands, spleen, and pancreas are otherwise unremarkable. Small hiatal hernia noted.  Small fat containing umbilical hernia. The appendix is dilated measuring 1.3 cm image 61, with surrounding free fluid present and mesenteric  stranding. Several prominent right lower quadrant mesenteric nodes are identified, largest 0.9 cm image 63. Linear probable sparing of omental fat by fluid is identified image 62, without other evidence for free air. There is mild rim enhancement of the periappendiceal fluid, measuring 3.2 x 1.4 cm, which could indicate developing abscess. No bowel wall thickening or focal segmental dilatation is identified.  Severe atheromatous aortic calcification noted without aneurysm. Colonic diverticuli noted without evidence for diverticulitis. Uterus and ovaries are normal. Bladder is normal.  No acute osseous abnormality. Multilevel disc degenerative change noted in the spine.  IMPRESSION: Appendiceal dilatation, surrounding stranding, and periappendiceal fluid compatible with acute appendicitis. There is an element of rim enhancement within the periphery of the periappendiceal fluid collection which could signify early abscess formation.   Electronically Signed   By: Conchita Paris [name]   On: 02/18/2014 19:27     EKG Interpretation None      MDM   Final diagnoses:  Acute appendicitis, unspecified acute appendicitis type     Patient is hemodynamic stable, pain is controlled in the ER. CT shows acute appendicitis. Discussed with surgery, Dr. Marlou Starks, who will admit. Given Zosyn in the ED.    Ephraim Hamburger, MD 02/18/14 [phone]

## 2014-02-18 NOTE — ED Notes (Signed)
Pt. Stated, No pain while Im sitting.

## 2014-02-18 NOTE — ED Notes (Signed)
Pt. Stated, My doctor thinks I might have appendicitis or bladder infection.  I started having abd. Pain yesterday . Dr. Dagmar Hait

## 2014-02-18 NOTE — Progress Notes (Signed)
ANTIBIOTIC CONSULT NOTE - FOLLOW UP  Pharmacy Consult for Zosyn Indication: intra-abdominal infection  Allergies  Allergen Reactions  . Biaxin [Clarithromycin] Other (See Comments)    achy     Patient Measurements: Height: 5' 6"  (167.6 cm) Weight: 220 lb (99.791 kg) IBW/kg (Calculated) : 59.3  Vital Signs: Temp: 99 F (37.2 C) (12/22 1346) Temp Source: Oral (12/22 1346) BP: 130/59 mmHg (12/22 2000) Pulse Rate: 81 (12/22 2000) Intake/Output from previous day:   Intake/Output from this shift:    Labs:  Recent Labs  02/18/14 1420  WBC 14.3*  HGB 14.1  PLT 320  CREATININE 0.70   Estimated Creatinine Clearance: 66.8 mL/min (by C-G formula based on Cr of 0.7). No results for input(s): VANCOTROUGH, VANCOPEAK, VANCORANDOM, GENTTROUGH, GENTPEAK, GENTRANDOM, TOBRATROUGH, TOBRAPEAK, TOBRARND, AMIKACINPEAK, AMIKACINTROU, AMIKACIN in the last 72 hours.   Microbiology: No results found for this or any previous visit (from the past 720 hour(s)).  Anti-infectives    Start     Dose/Rate Route Frequency Ordered Stop   02/18/14 2000  piperacillin-tazobactam (ZOSYN) IVPB 3.375 g     3.375 g100 mL/hr over 30 Minutes Intravenous  Once 02/18/14 1945        Assessment: 80yo female presents with R lower quadrant abdominal pain. Pharmacy is consulted to dose zosyn for intra-abdominal infection. Pt is afebrile, WBC 14.3, sCr 0.7 with CrCl ~ 65 mL/min. Pt received one time dose of zosyn at 2030 in the ED.  Goal of Therapy:  Resolution of infection  Plan:  Zosyn 3.375g IV q8h Follow up culture results, renal function, and clinical course  Andrey Cota. Diona Foley, PharmD Clinical Pharmacist Pager [phone] 02/18/2014,8:42 PM

## 2014-02-18 NOTE — ED Notes (Signed)
Pt reports pain in lower right quadrant that "doubled me over yesterday."  Pt visited doctor today and sent her here to r/o appendicitis or bladder infection.  Pt denies burning on urination.  Pt reports slight nausea with no v/d.

## 2014-02-18 NOTE — ED Notes (Signed)
Notified CT that pt is ready for scan.

## 2014-02-18 NOTE — H&P (Signed)
Ashley Parker is an 80 y.o. female.   Chief Complaint: abdominal pain HPI: The pt is an 80 yo wf who presents with abdominal pain that started yesterday afternoon while playing bridge. She did run a fever. She denies nausea or vomiting. Pain worsened so she came to ER. CT shows appendicitis but no perforation. WBC 14.3  Past Medical History  Diagnosis Date  . Osteoporosis   . Hypertension   . Obesity, unspecified   . Unspecified vitamin D deficiency   . Glaucoma   . Arthritis   . Macular degeneration   . Wrist fracture, left 1943, 1985  . Pelvic fracture 2007  . Hemorrhoids     Internal and External  . Stricture of esophagus     distal  . Fundic gland polyposis of stomach   . GERD (gastroesophageal reflux disease)   . Crohn's ileitis     Mild, mostly asymptomatic, not on therapy    Past Surgical History  Procedure Laterality Date  . Cataract extraction    . Total knee arthroplasty Left 2007  . Colonoscopy  07/27/2001, 07/08/2011    Multiple  . Tonsillectomy and adenoidectomy    . Esophagogastroduodenoscopy      multiple    Family History  Problem Relation Age of Onset  . Prostate cancer Father   . Breast cancer Mother   . Heart disease Mother   . Uterine cancer Mother   . Colon cancer Neg Hx    Social History:  reports that she has quit smoking. She has never used smokeless tobacco. She reports that she drinks alcohol. She reports that she does not use illicit drugs.  Allergies:  Allergies  Allergen Reactions  . Biaxin [Clarithromycin] Other (See Comments)    achy      (Not in a hospital admission)  Results for orders placed or performed during the hospital encounter of 02/18/14 (from the past 48 hour(s))  Basic metabolic panel     Status: Abnormal   Collection Time: 02/18/14  2:20 PM  Result Value Ref Range   Sodium 137 135 - 145 mmol/L    Comment: Please note change in reference range.   Potassium 3.7 3.5 - 5.1 mmol/L    Comment: Please note change in  reference range.   Chloride 105 96 - 112 mEq/L   CO2 25 19 - 32 mmol/L   Glucose, Bld 130 (H) 70 - 99 mg/dL   BUN 11 6 - 23 mg/dL   Creatinine, Ser 0.70 0.50 - 1.10 mg/dL   Calcium 9.5 8.4 - 10.5 mg/dL   GFR calc non Af Amer 80 (L) >90 mL/min   GFR calc Af Amer >90 >90 mL/min    Comment: (NOTE) The eGFR has been calculated using the CKD EPI equation. This calculation has not been validated in all clinical situations. eGFR's persistently <90 mL/min signify possible Chronic Kidney Disease.    Anion gap 7 5 - 15  CBC WITH DIFFERENTIAL     Status: Abnormal   Collection Time: 02/18/14  2:20 PM  Result Value Ref Range   WBC 14.3 (H) 4.0 - 10.5 K/uL   RBC 4.70 3.87 - 5.11 MIL/uL   Hemoglobin 14.1 12.0 - 15.0 g/dL   HCT 41.8 36.0 - 46.0 %   MCV 88.9 78.0 - 100.0 fL   MCH 30.0 26.0 - 34.0 pg   MCHC 33.7 30.0 - 36.0 g/dL   RDW 13.3 11.5 - 15.5 %   Platelets 320 150 - 400 K/uL  Neutrophils Relative % 82 (H) 43 - 77 %   Neutro Abs 11.7 (H) 1.7 - 7.7 K/uL   Lymphocytes Relative 12 12 - 46 %   Lymphs Abs 1.7 0.7 - 4.0 K/uL   Monocytes Relative 5 3 - 12 %   Monocytes Absolute 0.7 0.1 - 1.0 K/uL   Eosinophils Relative 1 0 - 5 %   Eosinophils Absolute 0.1 0.0 - 0.7 K/uL   Basophils Relative 0 0 - 1 %   Basophils Absolute 0.0 0.0 - 0.1 K/uL  Lipase, blood     Status: None   Collection Time: 02/18/14  2:20 PM  Result Value Ref Range   Lipase 23 11 - 59 U/L  Hepatic function panel     Status: Abnormal   Collection Time: 02/18/14  3:51 PM  Result Value Ref Range   Total Protein 7.2 6.0 - 8.3 g/dL   Albumin 4.0 3.5 - 5.2 g/dL   AST 59 (H) 0 - 37 U/L   ALT 80 (H) 0 - 35 U/L   Alkaline Phosphatase 78 39 - 117 U/L   Total Bilirubin 1.8 (H) 0.3 - 1.2 mg/dL   Bilirubin, Direct 0.4 (H) 0.0 - 0.3 mg/dL   Indirect Bilirubin 1.4 (H) 0.3 - 0.9 mg/dL  Urinalysis with microscopic     Status: Abnormal   Collection Time: 02/18/14  5:15 PM  Result Value Ref Range   Color, Urine YELLOW YELLOW    APPearance HAZY (A) CLEAR   Specific Gravity, Urine 1.020 1.005 - 1.030   pH 6.0 5.0 - 8.0   Glucose, UA NEGATIVE NEGATIVE mg/dL   Hgb urine dipstick NEGATIVE NEGATIVE   Bilirubin Urine NEGATIVE NEGATIVE   Ketones, ur NEGATIVE NEGATIVE mg/dL   Protein, ur NEGATIVE NEGATIVE mg/dL   Urobilinogen, UA 1.0 0.0 - 1.0 mg/dL   Nitrite NEGATIVE NEGATIVE   Leukocytes, UA TRACE (A) NEGATIVE  Urine microscopic-add on     Status: Abnormal   Collection Time: 02/18/14  5:15 PM  Result Value Ref Range   Squamous Epithelial / LPF RARE RARE   WBC, UA 3-6 <3 WBC/hpf   RBC / HPF 0-2 <3 RBC/hpf   Bacteria, UA FEW (A) RARE   Ct Abdomen Pelvis W Contrast  02/18/2014   CLINICAL DATA:  Right lower quadrant abdominal pain since yesterday  EXAM: CT ABDOMEN AND PELVIS WITH CONTRAST  TECHNIQUE: Multidetector CT imaging of the abdomen and pelvis was performed using the standard protocol following bolus administration of intravenous contrast.  CONTRAST:  120m OMNIPAQUE IOHEXOL 300 MG/ML  SOLN  COMPARISON:  None.  FINDINGS: Minimal dependent subpleural atelectasis or scarring is noted at the lung bases.  Hepatic hypodensity is most compatible with steatosis, without focal abnormality. Dependent sludge or stones noted within the gallbladder which is otherwise unremarkable. Nitrogen within gallstones incidentally identified image 29. No intra or extrahepatic ductal dilatation. No pancreatic ductal dilatation. The pancreas is partly fatty infiltrated. 1.3 cm hypodense left adrenal mass identified image 28. Right adrenal gland is unremarkable. Small gastrohepatic node measuring 0.9 cm identified image 22. Renal cortical cysts are noted, largest 6.1 cm right upper renal pole image 39. Right adrenal glands, spleen, and pancreas are otherwise unremarkable. Small hiatal hernia noted.  Small fat containing umbilical hernia. The appendix is dilated measuring 1.3 cm image 61, with surrounding free fluid present and mesenteric stranding.  Several prominent right lower quadrant mesenteric nodes are identified, largest 0.9 cm image 63. Linear probable sparing of omental fat by fluid is  identified image 20, without other evidence for free air. There is mild rim enhancement of the periappendiceal fluid, measuring 3.2 x 1.4 cm, which could indicate developing abscess. No bowel wall thickening or focal segmental dilatation is identified.  Severe atheromatous aortic calcification noted without aneurysm. Colonic diverticuli noted without evidence for diverticulitis. Uterus and ovaries are normal. Bladder is normal.  No acute osseous abnormality. Multilevel disc degenerative change noted in the spine.  IMPRESSION: Appendiceal dilatation, surrounding stranding, and periappendiceal fluid compatible with acute appendicitis. There is an element of rim enhancement within the periphery of the periappendiceal fluid collection which could signify early abscess formation.   Electronically Signed   By: Conchita Paris [name]   On: 02/18/2014 19:27    Review of Systems  Constitutional: Positive for fever.  HENT: Negative.   Eyes: Negative.   Respiratory: Negative.   Cardiovascular: Negative.   Gastrointestinal: Positive for abdominal pain. Negative for nausea.  Genitourinary: Negative.   Musculoskeletal: Negative.   Skin: Negative.   Neurological: Negative.   Endo/Heme/Allergies: Negative.   Psychiatric/Behavioral: Negative.     Blood pressure 130/59, pulse 81, temperature 99 F (37.2 C), temperature source Oral, resp. rate 18, height _0  (1.676 m), weight 220 lb (99.791 kg), SpO2 100 %. Physical Exam  Constitutional: She is oriented to person, place, and time. She appears well-developed and well-nourished.  HENT:  Head: Normocephalic and atraumatic.  Eyes: Conjunctivae and EOM are normal. Pupils are equal, round, and reactive to light.  Neck: Normal range of motion. Neck supple.  Cardiovascular: Normal rate, regular rhythm and normal heart  sounds.   Respiratory: Effort normal and breath sounds normal.  GI: Soft.  There is moderate tenderness of right abdomen with some guarding. Soft on left  Musculoskeletal: Normal range of motion.  Neurological: She is alert and oriented to person, place, and time.  Skin: Skin is warm and dry.  Psychiatric: She has a normal mood and affect. Her behavior is normal.     Assessment/Plan The patient appears to have acute appendicitis. Because of the risk of perforation and sepsis I think she will need to have her appendix removed. Will plan to admit for IV abx and plan for lap appy in AM.   TOTH III,PAUL S 02/18/2014, 8:40 PM

## 2014-02-19 ENCOUNTER — Inpatient Hospital Stay (HOSPITAL_COMMUNITY): Payer: Medicare Other | Admitting: Anesthesiology

## 2014-02-19 ENCOUNTER — Encounter (HOSPITAL_COMMUNITY): Admission: EM | Disposition: A | Discharge status: Home / Self Care | Payer: Self-pay | Source: Home / Self Care

## 2014-02-19 ENCOUNTER — Encounter (HOSPITAL_COMMUNITY): Payer: Self-pay | Admitting: Certified Registered Nurse Anesthetist

## 2014-02-19 HISTORY — PX: LAPAROSCOPIC APPENDECTOMY: SHX408

## 2014-02-19 LAB — SURGICAL PCR SCREEN
MRSA, PCR: NEGATIVE
Staphylococcus aureus: NEGATIVE

## 2014-02-19 SURGERY — APPENDECTOMY, LAPAROSCOPIC
Anesthesia: General | Site: Abdomen

## 2014-02-19 MED ORDER — SODIUM CHLORIDE 0.9 % IV SOLN
10.0000 mg | INTRAVENOUS | Status: DC | PRN
  Administered 2014-02-19: 10 ug/min via INTRAVENOUS

## 2014-02-19 MED ORDER — FENTANYL CITRATE 0.05 MG/ML IJ SOLN
INTRAMUSCULAR | Status: AC
  Filled 2014-02-19: qty 5

## 2014-02-19 MED ORDER — MENTHOL 3 MG MT LOZG
1.0000 | LOZENGE | OROMUCOSAL | Status: DC | PRN
  Administered 2014-02-19: 3 mg via ORAL
  Filled 2014-02-19: qty 9

## 2014-02-19 MED ORDER — SODIUM CHLORIDE 0.9 % IR SOLN
Status: DC | PRN
  Administered 2014-02-19 (×2): 1000 mL

## 2014-02-19 MED ORDER — ONDANSETRON HCL 4 MG/2ML IJ SOLN
INTRAMUSCULAR | Status: DC | PRN
  Administered 2014-02-19: 4 mg via INTRAVENOUS

## 2014-02-19 MED ORDER — LIDOCAINE HCL (CARDIAC) 20 MG/ML IV SOLN
INTRAVENOUS | Status: DC | PRN
  Administered 2014-02-19: 60 mg via INTRAVENOUS

## 2014-02-19 MED ORDER — BUPIVACAINE-EPINEPHRINE 0.5% -1:200000 IJ SOLN
INTRAMUSCULAR | Status: DC | PRN
  Administered 2014-02-19: 30 mL

## 2014-02-19 MED ORDER — PROPOFOL 10 MG/ML IV BOLUS
INTRAVENOUS | Status: DC | PRN
  Administered 2014-02-19: 160 mg via INTRAVENOUS

## 2014-02-19 MED ORDER — BUPIVACAINE-EPINEPHRINE (PF) 0.25% -1:200000 IJ SOLN
INTRAMUSCULAR | Status: AC
  Filled 2014-02-19: qty 30

## 2014-02-19 MED ORDER — MIDAZOLAM HCL 2 MG/2ML IJ SOLN
INTRAMUSCULAR | Status: AC
  Filled 2014-02-19: qty 2

## 2014-02-19 MED ORDER — 0.9 % SODIUM CHLORIDE (POUR BTL) OPTIME
TOPICAL | Status: DC | PRN
  Administered 2014-02-19: 1000 mL

## 2014-02-19 MED ORDER — PHENYLEPHRINE HCL 10 MG/ML IJ SOLN
INTRAMUSCULAR | Status: DC | PRN
  Administered 2014-02-19: 40 ug via INTRAVENOUS
  Administered 2014-02-19: 80 ug via INTRAVENOUS

## 2014-02-19 MED ORDER — LACTATED RINGERS IV SOLN
INTRAVENOUS | Status: DC | PRN
  Administered 2014-02-19 (×2): via INTRAVENOUS

## 2014-02-19 MED ORDER — PROPOFOL 10 MG/ML IV BOLUS
INTRAVENOUS | Status: AC
  Filled 2014-02-19: qty 20

## 2014-02-19 MED ORDER — SUCCINYLCHOLINE CHLORIDE 20 MG/ML IJ SOLN
INTRAMUSCULAR | Status: DC | PRN
  Administered 2014-02-19: 100 mg via INTRAVENOUS

## 2014-02-19 MED ORDER — FENTANYL CITRATE 0.05 MG/ML IJ SOLN
INTRAMUSCULAR | Status: DC | PRN
  Administered 2014-02-19: 50 ug via INTRAVENOUS
  Administered 2014-02-19: 100 ug via INTRAVENOUS
  Administered 2014-02-19 (×2): 50 ug via INTRAVENOUS

## 2014-02-19 MED ORDER — GLYCOPYRROLATE 0.2 MG/ML IJ SOLN
INTRAMUSCULAR | Status: AC
  Filled 2014-02-19: qty 3

## 2014-02-19 MED ORDER — NEOSTIGMINE METHYLSULFATE 10 MG/10ML IV SOLN
INTRAVENOUS | Status: DC | PRN
  Administered 2014-02-19: 4 mg via INTRAVENOUS

## 2014-02-19 MED ORDER — ROCURONIUM BROMIDE 50 MG/5ML IV SOLN
INTRAVENOUS | Status: AC
  Filled 2014-02-19: qty 1

## 2014-02-19 MED ORDER — ONDANSETRON HCL 4 MG/2ML IJ SOLN
INTRAMUSCULAR | Status: AC
  Filled 2014-02-19: qty 2

## 2014-02-19 MED ORDER — OXYCODONE-ACETAMINOPHEN 5-325 MG PO TABS
1.0000 | ORAL_TABLET | ORAL | Status: DC | PRN
  Administered 2014-02-19: 2 via ORAL
  Filled 2014-02-19: qty 2

## 2014-02-19 MED ORDER — ROCURONIUM BROMIDE 100 MG/10ML IV SOLN
INTRAVENOUS | Status: DC | PRN
  Administered 2014-02-19: 30 mg via INTRAVENOUS

## 2014-02-19 MED ORDER — NEOSTIGMINE METHYLSULFATE 10 MG/10ML IV SOLN
INTRAVENOUS | Status: AC
  Filled 2014-02-19: qty 1

## 2014-02-19 MED ORDER — GLYCOPYRROLATE 0.2 MG/ML IJ SOLN
INTRAMUSCULAR | Status: DC | PRN
  Administered 2014-02-19: 0.6 mg via INTRAVENOUS

## 2014-02-19 MED ORDER — LIDOCAINE HCL (CARDIAC) 20 MG/ML IV SOLN
INTRAVENOUS | Status: AC
  Filled 2014-02-19: qty 5

## 2014-02-19 SURGICAL SUPPLY — 41 items
CANISTER SUCTION 2500CC (MISCELLANEOUS) ×2 IMPLANT
CHLORAPREP W/TINT 26ML (MISCELLANEOUS) ×2 IMPLANT
COVER SURGICAL LIGHT HANDLE (MISCELLANEOUS) ×2 IMPLANT
CUTTER FLEX LINEAR 45M (STAPLE) ×2 IMPLANT
CUTTER LINEAR ENDO 35 ART FLEX (STAPLE) ×2 IMPLANT
DRAPE LAPAROSCOPIC ABDOMINAL (DRAPES) ×2 IMPLANT
DRAPE UTILITY XL STRL (DRAPES) ×4 IMPLANT
DRAPE WARM FLUID 44X44 (DRAPE) ×2 IMPLANT
ELECT REM PT RETURN 9FT ADLT (ELECTROSURGICAL) ×2
ELECTRODE REM PT RTRN 9FT ADLT (ELECTROSURGICAL) ×1 IMPLANT
GLOVE BIO SURGEON STRL SZ 6.5 (GLOVE) ×2 IMPLANT
GLOVE BIO SURGEON STRL SZ8 (GLOVE) ×2 IMPLANT
GLOVE BIOGEL PI IND STRL 6.5 (GLOVE) ×1 IMPLANT
GLOVE BIOGEL PI IND STRL 7.0 (GLOVE) ×2 IMPLANT
GLOVE BIOGEL PI IND STRL 8 (GLOVE) ×1 IMPLANT
GLOVE BIOGEL PI INDICATOR 6.5 (GLOVE) ×1
GLOVE BIOGEL PI INDICATOR 7.0 (GLOVE) ×2
GLOVE BIOGEL PI INDICATOR 8 (GLOVE) ×1
GOWN STRL REUS W/ TWL LRG LVL3 (GOWN DISPOSABLE) ×2 IMPLANT
GOWN STRL REUS W/ TWL XL LVL3 (GOWN DISPOSABLE) ×1 IMPLANT
GOWN STRL REUS W/TWL LRG LVL3 (GOWN DISPOSABLE) ×2
GOWN STRL REUS W/TWL XL LVL3 (GOWN DISPOSABLE) ×1
KIT BASIN OR (CUSTOM PROCEDURE TRAY) ×2 IMPLANT
KIT ROOM TURNOVER OR (KITS) ×2 IMPLANT
LIQUID BAND (GAUZE/BANDAGES/DRESSINGS) ×2 IMPLANT
NS IRRIG 1000ML POUR BTL (IV SOLUTION) ×2 IMPLANT
PAD ARMBOARD 7.5X6 YLW CONV (MISCELLANEOUS) ×4 IMPLANT
POUCH SPECIMEN RETRIEVAL 10MM (ENDOMECHANICALS) ×2 IMPLANT
RELOAD STAPLE TA45 3.5 REG BLU (ENDOMECHANICALS) ×2 IMPLANT
SCALPEL HARMONIC ACE (MISCELLANEOUS) ×2 IMPLANT
SET IRRIG TUBING LAPAROSCOPIC (IRRIGATION / IRRIGATOR) ×2 IMPLANT
SPECIMEN JAR SMALL (MISCELLANEOUS) ×2 IMPLANT
SUT MNCRL AB 4-0 PS2 18 (SUTURE) ×2 IMPLANT
SUT MON AB 4-0 PC3 18 (SUTURE) ×2 IMPLANT
TOWEL OR 17X24 6PK STRL BLUE (TOWEL DISPOSABLE) ×2 IMPLANT
TOWEL OR 17X26 10 PK STRL BLUE (TOWEL DISPOSABLE) ×2 IMPLANT
TRAY FOLEY CATH 16FR SILVER (SET/KITS/TRAYS/PACK) ×2 IMPLANT
TRAY LAPAROSCOPIC (CUSTOM PROCEDURE TRAY) ×2 IMPLANT
TROCAR XCEL BLADELESS 5X75MML (TROCAR) ×4 IMPLANT
TROCAR XCEL BLUNT TIP 100MML (ENDOMECHANICALS) ×2 IMPLANT
TUBING INSUFFLATION (TUBING) ×2 IMPLANT

## 2014-02-19 NOTE — Interval H&P Note (Signed)
History and Physical Interval Note:  02/19/2014 11:43 AM  Ashley Parker  has presented today for surgery, with the diagnosis of acute appendicitis  The various methods of treatment have been discussed with the patient and family. After consideration of risks, benefits and other options for treatment, the patient has consented to  Procedure(s): APPENDECTOMY LAPAROSCOPIC (N/A) as a surgical intervention .  The patient's history has been reviewed, patient examined, no change in status, stable for surgery.  I have reviewed the patient's chart and labs.  Questions were answered to the patient's satisfaction.   Pt seen and chart reviewed.  Treatment options discussed both medical and surgical.  Pt agrees to proceed with laparoscopic appencectomy.  The procedure has been discussed with the patient.  Alternative therapies have been discussed with the patient.  Operative risks include bleeding, open surgery   Infection,  Organ injury,  Nerve injury,  Blood vessel injury,  DVT,  Pulmonary embolism,  Death,  And possible reoperation.  Medical management risks include worsening of present situation.  The success of the procedure is 50 -90 % at treating patients symptoms.  The patient understands and agrees to proceed.  Hosey Burmester A.

## 2014-02-19 NOTE — Transfer of Care (Signed)
Immediate Anesthesia Transfer of Care Note  Patient: Ashley Parker  Procedure(s) Performed: Procedure(s): APPENDECTOMY LAPAROSCOPIC (N/A)  Patient Location: PACU  Anesthesia Type:General  Level of Consciousness: awake and sedated  Airway & Oxygen Therapy: Patient Spontanous Breathing and Patient connected to face mask oxygen  Post-op Assessment: Report given to PACU RN and Post -op Vital signs reviewed and stable  Post vital signs: Reviewed and stable  Complications: No apparent anesthesia complications

## 2014-02-19 NOTE — Op Note (Signed)
Appendectomy, Lap, Procedure Note  Indications: The patient presented with a history of right-sided abdominal pain. A CT revealed findings consistent with acute appendicitis.  The procedure has been discussed with the patient.  Alternative therapies have been discussed with the patient.  Operative risks include bleeding,  Infection,  Organ injury,  Nerve injury,  Blood vessel injury,  DVT,  Pulmonary embolism,  Death,  And possible reoperation.  Medical management risks include worsening of present situation.  The success of the procedure is 50 -90 % at treating patients symptoms.  The patient understands and agrees to proceed.  Pre-operative Diagnosis: Acute appendicitis without mention of peritonitis  Post-operative Diagnosis: Acute appendicitis with peritoneal abscess  Surgeon: Erroll Luna A.   Assistants: HITCHCOCK RNFA  Anesthesia: General endotracheal anesthesia and Local anesthesia 0.25.% bupivacaine, with epinephrine  ASA Class: 2, 3  Procedure Details  The patient was seen again in the Holding Room. The risks, benefits, complications, treatment options, and expected outcomes were discussed with the patient and/or family. The possibilities of reaction to medication, pulmonary aspiration, perforation of viscus, bleeding, recurrent infection, finding a normal appendix, the need for additional procedures, failure to diagnose a condition, and creating a complication requiring transfusion or operation were discussed. There was concurrence with the proposed plan and informed consent was obtained. The site of surgery was properly noted/marked. The patient was taken to Operating Room, identified as Ashley Parker and the procedure verified as Appendectomy. A Time Out was held and the above information confirmed.  The patient was placed in the supine position and general anesthesia was induced, along with placement of orogastric tube, Venodyne boots, and a Foley catheter. The abdomen was  prepped and draped in a sterile fashion. A one centimeter infraumbilical incision was made and the peritoneal cavity was accessed using the OPEN  technique. The pneumoperitoneum was then established to steady pressure of 12 mmHg. A 12 mm port was placed through the umbilical incision. Additional 5 mm cannulas then placed in the left lower quadrant of the abdomen and half way between the umbilicus and pubic symphysis under direct vision. A careful evaluation of the entire abdomen was carried out. The patient was placed in Trendelenburg and left lateral decubitus position. The small intestines were retracted in the cephalad and left lateral direction away from the pelvis and right lower quadrant. The patient was found to have an enlarged and inflamed appendix that was extending into the pelvis. There was no evidence of perforation.  The appendix was carefully dissected. A window was made in the mesoappendix at the base of the appendix. A harmonic scalpel was used across the mesoappendix. The appendix was divided at its base using an endo-GIA stapler. Minimal appendiceal stump was left in place. There was no evidence of bleeding, leakage, or complication after division of the appendix. Irrigation was also performed and irrigate suctioned from the abdomen as well.  The umbilical port site was closed using 0 vicryl pursestring sutures fashion at the level of the fascia. The trocar site skin wounds were closed using skin staples.  Instrument, sponge, and needle counts were correct at the conclusion of the case.   Findings: The appendix was found to be inflamed. There were signs of necrosis.  There was perforation. There was abscess formation.  Estimated Blood Loss:  less than 50 mL         Drains: NONE         Total IV Fluids: 600 mL  Specimens: APPENDIX TO PATH         Complications:  None; patient tolerated the procedure well.         Disposition: PACU - hemodynamically stable.          Condition: stable

## 2014-02-19 NOTE — Anesthesia Procedure Notes (Signed)
Procedure Name: Intubation Date/Time: 02/19/2014 12:36 PM Performed by: Octavio Graves Pre-anesthesia Checklist: Patient identified, Timeout performed, Emergency Drugs available, Suction available and Patient being monitored Patient Re-evaluated:Patient Re-evaluated prior to inductionOxygen Delivery Method: Circle system utilized Preoxygenation: Pre-oxygenation with 100% oxygen Intubation Type: IV induction Ventilation: Mask ventilation without difficulty Laryngoscope Size: Miller and 2 Grade View: Grade I Tube type: Oral Tube size: 7.0 mm Number of attempts: 1 Airway Equipment and Method: Stylet Placement Confirmation: ETT inserted through vocal cords under direct vision,  positive ETCO2 and breath sounds checked- equal and bilateral Secured at: 21 cm Tube secured with: Tape Dental Injury: Teeth and Oropharynx as per pre-operative assessment  Comments: IV induction - intubation Gilford Rile CRNA- atraumatic- mouth as preop-

## 2014-02-19 NOTE — Anesthesia Postprocedure Evaluation (Signed)
  Anesthesia Post-op Note  Patient: Ashley Parker  Procedure(s) Performed: Procedure(s): APPENDECTOMY LAPAROSCOPIC (N/A)  Patient Location: PACU  Anesthesia Type:General  Level of Consciousness: awake  Airway and Oxygen Therapy: Patient Spontanous Breathing  Post-op Pain: mild  Post-op Assessment: Post-op Vital signs reviewed  Post-op Vital Signs: Reviewed  Last Vitals:  Filed Vitals:   02/19/14 0516  BP: 114/42  Pulse: 77  Temp: 36.9 C  Resp: 16    Complications: No apparent anesthesia complications

## 2014-02-19 NOTE — Anesthesia Preprocedure Evaluation (Signed)
Anesthesia Evaluation  Patient identified by MRN, date of birth, ID band Patient awake    History of Anesthesia Complications Negative for: history of anesthetic complications  Airway Mallampati: II  TM Distance: >3 FB Neck ROM: Full    Dental  (+) Dental Advisory Given   Pulmonary          Cardiovascular     Neuro/Psych    GI/Hepatic GERD-  ,  Endo/Other    Renal/GU      Musculoskeletal   Abdominal   Peds  Hematology   Anesthesia Other Findings   Reproductive/Obstetrics                             Anesthesia Physical Anesthesia Plan  ASA: III  Anesthesia Plan: General   Post-op Pain Management:    Induction: Intravenous  Airway Management Planned: Oral ETT  Additional Equipment:   Intra-op Plan:   Post-operative Plan:   Informed Consent:   Plan Discussed with: CRNA, Anesthesiologist and Surgeon  Anesthesia Plan Comments:         Anesthesia Quick Evaluation

## 2014-02-19 NOTE — Discharge Instructions (Signed)
Laparoscopic Appendectomy Care After Refer to this sheet in the next few weeks. These instructions provide you with information on caring for yourself after your procedure. Your caregiver may also give you more specific instructions. Your treatment has been planned according to current medical practices, but problems sometimes occur. Call your caregiver if you have any problems or questions after your procedure. HOME CARE INSTRUCTIONS  Do not drive while taking narcotic pain medicines.  Use stool softener if you become constipated from your pain medicines.  Change your bandages (dressings) as directed.  Keep your wounds clean and dry. You may wash the wounds gently with soap and water. Gently pat the wounds dry with a clean towel.  Do not take baths, swim, or use hot tubs for 10 days, or as instructed by your caregiver.  Only take over-the-counter or prescription medicines for pain, discomfort, or fever as directed by your caregiver.  You may continue your normal diet as directed.  Do not lift more than 10 pounds (4.5 kg) or play contact sports for 3 weeks, or as directed.  Slowly increase your activity after surgery.  Take deep breaths to avoid getting a lung infection (pneumonia). SEEK MEDICAL CARE IF:  You have redness, swelling, or increasing pain in your wounds.  You have pus coming from your wounds.  You have drainage from a wound that lasts longer than 1 day.  You notice a bad smell coming from the wounds or dressing.  Your wound edges break open after stitches (sutures) have been removed.  You notice increasing pain in the shoulders (shoulder strap areas) or near your shoulder blades.  You develop dizzy episodes or fainting while standing.  You develop shortness of breath.  You develop persistent nausea or vomiting.  You cannot control your bowel functions or lose your appetite.  You develop diarrhea. SEEK IMMEDIATE MEDICAL CARE IF:   You have a fever.  You  develop a rash.  You have difficulty breathing or sharp pains in your chest.  You develop any reaction or side effects to medicines given. MAKE SURE YOU:  Understand these instructions.  Will watch your condition.  Will get help right away if you are not doing well or get worse. Document Released: 02/14/2005 Document Revised: 05/09/2011 Document Reviewed: 08/24/2010 [facility] Patient Information 2015 [place], [place]. This information is not intended to replace advice given to you by your health care provider. Make sure you discuss any questions you have with your health care provider. [facility] ______[facility] LAPAROSCOPIC SURGERY: POST OP INSTRUCTIONS Always review your discharge instruction sheet given to you by the facility where your surgery was performed. IF YOU HAVE DISABILITY OR FAMILY LEAVE FORMS, YOU MUST BRING THEM TO THE OFFICE FOR PROCESSING.   DO NOT GIVE THEM TO YOUR DOCTOR.  1. A prescription for pain medication may be given to you upon discharge.  Take your pain medication as prescribed, if needed.  If narcotic pain medicine is not needed, then you may take acetaminophen (Tylenol) or ibuprofen (Advil) as needed. 2. Take your usually prescribed medications unless otherwise directed. 3. If you need a refill on your pain medication, please contact your pharmacy.  They will contact our office to request authorization. Prescriptions will not be filled after 5pm or on week-ends. 4. You should follow a light diet the first few days after arrival home, such as soup and crackers, etc.  Be sure to include lots of fluids daily. 5. Most patients will experience some swelling and bruising  in the area of the incisions.  Ice packs will help.  Swelling and bruising can take several days to resolve.  6. It is common to experience some constipation if taking pain medication after surgery.  Increasing fluid intake and taking a stool softener (such as Colace) will usually help or  prevent this problem from occurring.  A mild laxative (Milk of Magnesia or Miralax) should be taken according to package instructions if there are no bowel movements after 48 hours. 7. Unless discharge instructions indicate otherwise, you may remove your bandages 24-48 hours after surgery, and you may shower at that time.  You may have steri-strips (small skin tapes) in place directly over the incision.  These strips should be left on the skin for 7-10 days.  If your surgeon used skin glue on the incision, you may shower in 24 hours.  The glue will flake off over the next 2-3 weeks.  Any sutures or staples will be removed at the office during your follow-up visit. 8. ACTIVITIES:  You may resume regular (light) daily activities beginning the next day--such as daily self-care, walking, climbing stairs--gradually increasing activities as tolerated.  You may have sexual intercourse when it is comfortable.  Refrain from any heavy lifting or straining until approved by your doctor. a. You may drive when you are no longer taking prescription pain medication, you can comfortably wear a seatbelt, and you can safely maneuver your car and apply brakes. b. RETURN TO WORK:  __________________________________________________________ 9. You should see your doctor in the office for a follow-up appointment approximately 2-3 weeks after your surgery.  Make sure that you call for this appointment within a day or two after you arrive home to insure a convenient appointment time. 10. OTHER INSTRUCTIONS: __________________________________________________________________________________________________________________________ __________________________________________________________________________________________________________________________ WHEN TO CALL YOUR DOCTOR: 1. Fever over 101.0 2. Inability to urinate 3. Continued bleeding from incision. 4. Increased pain, redness, or drainage from the incision. 5. Increasing  abdominal pain  The clinic staff is available to answer your questions during regular business hours.  Please dont hesitate to call and ask to speak to one of the nurses for clinical concerns.  If you have a medical emergency, go to the nearest emergency room or call 911.  A surgeon from [facility] is always on call at the hospital. [address] [place], [place], [place]  [address] ? [address], [place], [place]   [address] [phone] ? [phone] ? FAX [phone] Web site: [email or web address]

## 2014-02-20 ENCOUNTER — Encounter (HOSPITAL_COMMUNITY): Payer: Self-pay | Admitting: Surgery

## 2014-02-20 LAB — CBC
HEMATOCRIT: 37.9 % (ref 36.0–46.0)
Hemoglobin: 12.2 g/dL (ref 12.0–15.0)
MCH: 29.3 pg (ref 26.0–34.0)
MCHC: 32.2 g/dL (ref 30.0–36.0)
MCV: 90.9 fL (ref 78.0–100.0)
Platelets: 253 10*3/uL (ref 150–400)
RBC: 4.17 MIL/uL (ref 3.87–5.11)
RDW: 13.7 % (ref 11.5–15.5)
WBC: 11.7 10*3/uL — ABNORMAL HIGH (ref 4.0–10.5)

## 2014-02-20 MED ORDER — AMOXICILLIN-POT CLAVULANATE 875-125 MG PO TABS: 1.0000 | ORAL_TABLET | Freq: Two times a day (BID) | ORAL | Status: DC

## 2014-02-20 MED ORDER — HYDROCODONE-ACETAMINOPHEN 5-325 MG PO TABS: 1.0000 | ORAL_TABLET | Freq: Four times a day (QID) | ORAL | Status: DC | PRN

## 2014-02-20 NOTE — Discharge Summary (Signed)
Patient ID: Ashley Parker [id number] 80 y.o. 1931/06/03  Admit date: 02/18/2014  Discharge date and time: 02/20/2014  Admitting Physician: Autumn Messing. MD  Discharge Physician: Adin Hector  Admission Diagnoses: Acute appendicitis, unspecified acute appendicitis type [K35.80]  Discharge Diagnoses: Acute appendicitis with abscess  Operations: Procedure(s): APPENDECTOMY LAPAROSCOPIC  Admission Condition: fair  Discharged Condition: good  Indication for Admission: This is an 80 year old female in fairly good health who presented with right-sided abdominal pain. Exam revealed tenderness and a CT scan revealed findings consistent with acute appendicitis.   Hospital Course: The patient was admitted and placed on antibiotics. She was taken to the operating room by Dr. Erroll Luna. She underwent a laparoscopic appendectomy and drainage of a small abscess.  [name] She was continued on antibiotics. On postoperative day 1 she felt well and looked well and requested that she be discharged home. She was tolerating a liquid diet, ambulating to the bathroom and voiding well. She was afebrile with stable VS.  Abdomen was soft. Wounds looked good. They really didn't seem tender at all. She was given a prescription for Augmentin for 7 days and a prescription for Norco for pain. Diet and activities were discussed. Follow-up with [place] in the office in 2 weeks.  Consults: None  Significant Diagnostic Studies: CT scan, lab work, and surgical pathology.  Treatments: surgery: Laparoscopic appendectomy  Disposition: Home  Patient Instructions:    Medication List    TAKE these medications        AMBULATORY NON FORMULARY MEDICATION  - Tumeric 500 mg  - Takes as directed     AMBULATORY NON FORMULARY MEDICATION  - Reservatrol drops for eyes OTC  - Takes as directed     amoxicillin-clavulanate 875-125 MG per tablet  Commonly known as:  AUGMENTIN  Take 1 tablet by mouth 2 (two) times daily.      aspirin 81 MG tablet  Take 81 mg by mouth daily.     BILBERRY PO  Take 470 mg by mouth daily.     bimatoprost 0.03 % ophthalmic solution  Commonly known as:  LUMIGAN  1 drop at bedtime.     calcium citrate-vitamin D 500-400 MG-UNIT chewable tablet  Chew by mouth 3 (three) times daily.     DORZOLAMIDE HCL-TIMOLOL MAL OP  Apply to eye as directed.     Flaxseed Misc  1,000 mg.     HYDROcodone-acetaminophen 5-325 MG per tablet  Commonly known as:  NORCO  Take 1-2 tablets by mouth every 6 (six) hours as needed for moderate pain or severe pain.     irbesartan 150 MG tablet  Commonly known as:  AVAPRO  Take 150 mg by mouth daily.     LUTEIN PO  Take 40 mg by mouth daily.     OMEGA 3 PO  Take by mouth.     omeprazole 20 MG tablet  Commonly known as:  PRILOSEC OTC  Take 20 mg by mouth daily before supper.     VITAMIN B 12 PO  Take 1 tablet by mouth daily.     VITAMIN D-3 PO  Take by mouth. Takes 5000 by mouth once daily     vitamin E 400 UNIT capsule  Take 400 Units by mouth daily.        Activity: Activity as tolerated. No sports or heavy lifting for 2 weeks. Diet: low fat, low cholesterol diet Wound Care: as directed  Follow-up:  With [name] clinic in 2 weeks.  Signed: Edsel Petrin.  Dalbert Batman, [name], FACS General and minimally invasive surgery Breast and Colorectal Surgery  02/20/2014, 5:49 AM

## 2014-02-20 NOTE — Progress Notes (Signed)
Discharge home. Home discharge instruction given, wound care discussed with the patient and spouse, prescription given as well. No questions verbalized.

## 2014-02-28 DIAGNOSIS — C44310 Basal cell carcinoma of skin of unspecified parts of face: Secondary | ICD-10-CM

## 2014-02-28 HISTORY — DX: Basal cell carcinoma of skin of unspecified parts of face: C44.310

## 2014-07-07 ENCOUNTER — Other Ambulatory Visit (HOSPITAL_COMMUNITY): Payer: Self-pay | Admitting: Respiratory Therapy

## 2014-07-07 DIAGNOSIS — J45909 Unspecified asthma, uncomplicated: Secondary | ICD-10-CM

## 2014-07-16 ENCOUNTER — Ambulatory Visit (HOSPITAL_COMMUNITY)
Admission: RE | Admit: 2014-07-16 | Discharge: 2014-07-16 | Disposition: A | Discharge status: Home / Self Care | Payer: Medicare Other | Source: Ambulatory Visit | Attending: Internal Medicine | Admitting: Internal Medicine

## 2014-07-16 DIAGNOSIS — J45909 Unspecified asthma, uncomplicated: Secondary | ICD-10-CM | POA: Insufficient documentation

## 2014-07-16 MED ORDER — ALBUTEROL SULFATE (2.5 MG/3ML) 0.083% IN NEBU
2.5000 mg | INHALATION_SOLUTION | Freq: Once | RESPIRATORY_TRACT | Status: AC
  Administered 2014-07-16: 2.5 mg via RESPIRATORY_TRACT

## 2014-07-17 LAB — PULMONARY FUNCTION TEST
DL/VA % pred: 73 %
DL/VA: 3.69 ml/min/mmHg/L
DLCO unc % pred: 63 %
DLCO unc: 17.2 ml/min/mmHg
FEF 25-75 PRE: 1.05 L/s
FEF 25-75 Post: 1.39 L/sec
FEF2575-%CHANGE-POST: 32 %
FEF2575-%Pred-Post: 95 %
FEF2575-%Pred-Pre: 71 %
FEV1-%CHANGE-POST: 10 %
FEV1-%PRED-PRE: 90 %
FEV1-%Pred-Post: 99 %
FEV1-Post: 2.09 L
FEV1-Pre: 1.89 L
FEV1FVC-%Change-Post: 9 %
FEV1FVC-%Pred-Pre: 86 %
FEV6-%Change-Post: 3 %
FEV6-%PRED-POST: 111 %
FEV6-%Pred-Pre: 108 %
FEV6-POST: 2.97 L
FEV6-PRE: 2.88 L
FEV6FVC-%Change-Post: 2 %
FEV6FVC-%PRED-PRE: 102 %
FEV6FVC-%Pred-Post: 105 %
FVC-%Change-Post: 0 %
FVC-%PRED-POST: 105 %
FVC-%Pred-Pre: 105 %
FVC-Post: 2.97 L
FVC-Pre: 2.96 L
POST FEV6/FVC RATIO: 100 %
PRE FEV6/FVC RATIO: 97 %
Post FEV1/FVC ratio: 70 %
Pre FEV1/FVC ratio: 64 %
RV % PRED: 91 %
RV: 2.31 L
TLC % PRED: 100 %
TLC: 5.37 L

## 2014-07-30 LAB — IFOBT (OCCULT BLOOD): IFOBT: NEGATIVE

## 2014-08-01 ENCOUNTER — Other Ambulatory Visit: Payer: Self-pay

## 2014-08-01 ENCOUNTER — Other Ambulatory Visit: Payer: Self-pay | Admitting: Internal Medicine

## 2014-08-01 DIAGNOSIS — Z1231 Encounter for screening mammogram for malignant neoplasm of breast: Secondary | ICD-10-CM

## 2014-08-08 ENCOUNTER — Ambulatory Visit (INDEPENDENT_AMBULATORY_CARE_PROVIDER_SITE_OTHER): Payer: Medicare Other | Admitting: Pulmonary Disease

## 2014-08-08 ENCOUNTER — Encounter (INDEPENDENT_AMBULATORY_CARE_PROVIDER_SITE_OTHER): Payer: Self-pay

## 2014-08-08 ENCOUNTER — Encounter: Payer: Self-pay | Admitting: Pulmonary Disease

## 2014-08-08 VITALS — BP 130/80 | HR 84 | Temp 98.2°F | Ht 66.0 in | Wt 227.0 lb

## 2014-08-08 DIAGNOSIS — J45901 Unspecified asthma with (acute) exacerbation: Secondary | ICD-10-CM

## 2014-08-08 DIAGNOSIS — J683 Other acute and subacute respiratory conditions due to chemicals, gases, fumes and vapors: Secondary | ICD-10-CM | POA: Insufficient documentation

## 2014-08-08 MED ORDER — FLUTICASONE-SALMETEROL 500-50 MCG/DOSE IN AEPB: 1.0000 | INHALATION_SPRAY | Freq: Two times a day (BID) | RESPIRATORY_TRACT | Status: DC

## 2014-08-08 MED ORDER — METHYLPREDNISOLONE 8 MG PO TABS: ORAL_TABLET | ORAL | Status: DC

## 2014-08-08 MED ORDER — PROAIR RESPICLICK 108 (90 BASE) MCG/ACT IN AEPB: 2.0000 | INHALATION_SPRAY | Freq: Four times a day (QID) | RESPIRATORY_TRACT | Status: DC | PRN

## 2014-08-08 NOTE — Patient Instructions (Signed)
Sandi- it was great meeting you today...  I certainly hope we can help you with your reactive airways disease...  We decided to change your Qvar to [id number]- one inhalation twice daily...  And we are prescribing another coarse of an oral anti-inflammatory medication> MEDROL 35m tabs:    Start w/ one tab twice daily for 5 days...    Then decrease to 1 tab each AM for 5 days...    Then decrease to 1/2 tab daily for 5 days...    Then decrease to 1/2 tab every other day (1/2, 0, 1/2, 0, etc) til gone...  You may continue the PROAIR inhaler- 1-2 sprays every 6H as needed for wheezing...  For the congestion use the OTC MUCINEX 6036m one tab up to 4 times daily w/ extra fluids...  Call for any questions...  Let's plan a follow up visit in about 6 weeks, sooner if needed for problems...Marland KitchenMarland Kitchen

## 2014-08-08 NOTE — Progress Notes (Addendum)
Subjective:     Patient ID: Ashley Parker, female   DOB: 1931/03/22, 81 y.o.   MRN: [id number]  HPI 51 y/o WF, friend of Ashley Parker, and referred by Ashley Parker due to refractory AB/ RADS>  She is an ex-smoker but quit over 30 yrs ago, & she denies any hx of signif resp tract illnesses in the past;  She states that she had an upper resp infection in Feb-Mar2016 characterized by head congestion, drainage, cough w/ chest congestion & very thick beige/yellow mucus that would get stuck in her throat & make her choke;  She had assoc SOB/DOE but denied CP or f/c/s;  She was approriately treated by her PCP w/ antibiotics, Prednisone, Prilosec & anti-reflux regimen;  She notes that the Pred seemed to help for awhile but all the symptoms came roaring back & were hard to shake off;  Currently she notes cough, sm amt yellow sput, no hemoptysis, no CP, +SOB & she teaches water aerobics classes 5d/wk!Ashley Parker..  Current Meds> Qvar80-2spBid, Proair pen (ran out), Tessalon perles as needed...       [name] is an ex-smoker> starting in her teens and smoked for 30+yrs up to 1ppd; she quit at age 30 & has not smoked in over 30 yrs now;  While she was smoking she denies much in the way of resp symptoms- denies cough, sput, hemoptysis, SOB, etc;  She denies signif resp illnesses in the past- never diagnosed w/ asthma, COPD, pneumonia, TB or known exposure; she has had bronchitis in the past- given antibiotics and once required an Albuterol inhaler which she recalls helped her symptoms;  She has been employed in retail & the office setting w/o known occupational respiratory exposures;  There is no FamHx of lung dis...  EXAM shows Afeb, VSS, O2sat=94%;  HEENT- neg, mallampati2;  Chest- few scat rhonchi at bases, no incr WOB, no signs of consolidation;  Heart- RR w/o m/r/g;  Abd- obese, soft, neg;  Ext- w/o c/c/e  CXR was done recently by DrAvva but we do not have this film or report to review; only CXR in Epic/PACS was from 2010- norm  heart size, mild hyperinflated, clear, NAD...  CT Angio Chest 12/2008 reviewed in Epic/PACS- neg for PE, norm heart size, mild coronary calcif noted, gallstone, mild fx T12, fusion T10-11 (?congen vs degen), NAD in the chest.  FullPFTs done 07/16/14 reviewed in Epic> FVC=2.96 (105%), FEV1=1.89 (90%), %1sec=64, mid-flows reduced at 71% predicted; Lung vols= wnl;  Diffusion is reduced at 63%  LABS 06/2014 by DrAvva> Chems- wnl x GPT=46, A1c=5.8;  CBC- wnl;  TSH=2.19;  VitD=39...  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) and precipitated by her URI 3 months ago; she responded to one of the Prednisone courses that she was prescribed but the symptoms returned after this med was stopped; she has been tried on an ICS and a SABA but she has not had consistent relief;  We discussed these issues and the nature of RADS- I rec that we treat her again w/ an oral corticosteroid (try MEDROL 82m in a slow tapering schedule- see AVS), and add-in an ICS/LABA combination like [id number]- one inhalation Bid; she can still use the PROAIR 1-2 sprays every 4-6H as needed for wheezing & chest tightness, plus MUCINEX 6052mQid w/ fluids for the thick sticky sputum that has given her so much trouble... We [place] have asked her to  call for problems and check back w/ me in about 6wks...   ADDENDUM >> CXR 05/06/14 report received from DrAvva's office- norm heart size, ectatic Ao, clear lungs, compression deformity in lower Tspine (stable, NAD...    Past Medical History  Diagnosis Date  . Osteoporosis >> on calcium, vitamins, VitD   . Hypertension >> on Avapro150   . Obesity, unspecified >> 5' 6"  tall, 227#,  BMI=36-37   . Unspecified vitamin D deficiency   . Glaucoma   . Arthritis   . Macular degeneration   . Wrist fracture, left 1943, 1985  . Pelvic fracture 2007  . Hemorrhoids     Internal and External  . Stricture of esophagus     distal   . Fundic gland polyposis of stomach   . GERD (gastroesophageal reflux disease) >> on Prilosec20   . Crohn's ileitis     Mild, mostly asymptomatic, not on therapy    Past Surgical History  Procedure Laterality Date  . Cataract extraction    . Total knee arthroplasty Left 2007  . Colonoscopy  07/27/2001, 07/08/2011    Multiple  . Tonsillectomy and adenoidectomy    . Esophagogastroduodenoscopy      multiple  . Laparoscopic appendectomy N/A 02/19/2014    Procedure: APPENDECTOMY LAPAROSCOPIC;  Surgeon: Erroll Luna, MD;  Location: [place];  Service: General;  Laterality: N/A;    Outpatient Encounter Prescriptions as of 08/08/2014  Medication Sig  . AMBULATORY NON FORMULARY MEDICATION Tumeric 500 mg Takes as directed  . AMBULATORY NON FORMULARY MEDICATION Reservatrol drops for eyes OTC Takes as directed  . aspirin 81 MG tablet Take 81 mg by mouth daily.    . benzonatate (TESSALON) 200 MG capsule TK 1 C PO Q 8 H PRF COUGH  . Bilberry, Vaccinium myrtillus, (BILBERRY PO) Take 470 mg by mouth daily.  . bimatoprost (LUMIGAN) 0.03 % ophthalmic drops 1 drop at bedtime.    . Calcium Citrate-Vitamin D 500-400 MG-UNIT CHEW Chew by mouth 3 (three) times daily.  . Cholecalciferol (VITAMIN D-3 PO) Take by mouth. Takes 5000 by mouth once daily  . Cyanocobalamin (VITAMIN B 12 PO) Take 1 tablet by mouth daily.  . DORZOLAMIDE HCL-TIMOLOL MAL OP Apply to eye as directed.  . Flaxseed MISC 1,000 mg.   . irbesartan (AVAPRO) 150 MG tablet Take 150 mg by mouth daily.  . LUTEIN PO Take 40 mg by mouth daily.   . Omega-3 Fatty Acids (OMEGA 3 PO) Take by mouth.    Ashley Parker omeprazole (PRILOSEC OTC) 20 MG tablet Take 20 mg by mouth daily before supper.  Ashley Parker PROAIR RESPICLICK 226 (90 BASE) MCG/ACT AEPB Take 2 puffs by mouth 4 (four) times daily as needed.  . vitamin E 400 UNIT capsule Take 400 Units by mouth daily.    . [DISCONTINUED] amoxicillin-clavulanate (AUGMENTIN) 875-125 MG per tablet Take 1 tablet by mouth 2  (two) times daily.  . [DISCONTINUED] HYDROcodone-acetaminophen (NORCO) 5-325 MG per tablet Take 1-2 tablets by mouth every 6 (six) hours as needed for moderate pain or severe pain.   No facility-administered encounter medications on file as of 08/08/2014.    Allergies  Allergen Reactions  . Biaxin [Clarithromycin] Other (See Comments)    achy     Immunization History  Administered Date(s) Administered  . Influenza Whole 11/26/2007, 12/28/2009  . Influenza-Unspecified 12/07/2013  . Pneumococcal Polysaccharide-23 08/09/2006  . Td 11/26/2007  PREVNAR-13 was given by DrAvva 02/11/14   Family History  Problem Relation Age of Onset  .  Prostate cancer Father   . Breast cancer Mother   . Heart disease Mother   . Uterine cancer Mother   . Colon cancer Neg Hx     History   Social History  . Marital Status: Married    Spouse Name: N/A  . Number of Children: 5  . Years of Education: N/A   Occupational History  . Retired    Social History Main Topics  . Smoking status: Former Smoker -- 1.00 packs/day for 32 years    Types: Cigarettes    Quit date: 02/19/1984  . Smokeless tobacco: Never Used  . Alcohol Use: 0.0 oz/week    0 Standard drinks or equivalent per week     Comment: 1 drink daily   . Drug Use: No  . Sexual Activity: Not Currently   Other Topics Concern  . Not on file   Social History Narrative   Regular exercise-yes - aqua aerobics, walking 1hr x 5 each week   Married   5 children, 22 grandchildren, worked in past as garden Building services engineer, Education officer, community, Electrical engineer   Daily caffeine x 2    Current Medications, Allergies, Past Medical History, Past Surgical History, Family History, and Social History were reviewed in [employer] record.   Review of Systems             All symptoms NEG except where BOLDED >>  Constitutional:  F/C/S, fatigue, anorexia, unexpected weight change. HEENT:  HA, visual changes, hearing loss, earache, nasal  symptoms, sore throat, mouth sores, hoarseness. Resp:  cough, sputum, hemoptysis; SOB, tightness, wheezing. Cardio:  CP, palpit, DOE, orthopnea, edema. GI:  N/V/D/C, blood in stool; reflux, abd pain, distention, gas. GU:  dysuria, freq, urgency, hematuria, flank pain, voiding difficulty. MS:  joint pain, swelling, tenderness, decr ROM; neck pain, back pain. Neuro:  HA, tremors, seizures, dizziness, syncope, weakness, numbness, gait abn. Skin:  suspicious lesions or skin rash. Heme:  adenopathy, bruising, bleeding. Psyche:  confusion, agitation, sleep disturbance, hallucinations, anxiety, depression suicidal.   Objective:   Physical Exam      Vital Signs:  Reviewed...  General:  WD, overweight, 81 y/o WF in NAD; alert & oriented; pleasant & cooperative... HEENT:  [place]/AT; Conjunctiva- pink, Sclera- nonicteric, EOM-wnl, PERRLA, EACs-clear, TMs-wnl; NOSE-clear; THROAT-clear & wnl. Neck:  Supple w/ fairROM; no JVD; normal carotid impulses w/o bruits; no thyromegaly or nodules palpated; no lymphadenopathy. Chest:  Sl decr BS at bases, few scat rhonchi w/o wheezing/ rales/ signs of consolidation. Heart:  Regular Rhythm; norm S1 & S2 without murmurs, rubs, or gallops detected. Abdomen:  Obese, soft & nontender- no guarding or rebound; normal bowel sounds; no organomegaly or masses palpated. Ext:  Sl decr ROM; +arthritic changes; no varicose veins, +venous insuffic, no edema;  Pulses intact w/o bruits. Neuro:  No focal neuro deficits, gait normal & balance OK. Derm:  No lesions noted; no rash etc. Lymph:  No cervical, supraclavicular, axillary, or inguinal adenopathy palpated.   Assessment:     Mild obstructive lung dis by PFT 06/2014 RADS following a URI 04/2014 w/ refractory symptoms Decreased DLCO of ?etiology Ex-smoker Obesity  I agree w/ Ashley Parker that Sandi has a form of AB/ RADS (intrinsic) and precipitated by her URI 3 months ago; she responded to one of the Prednisone courses that she  was prescribed but the symptoms returned after this med was stopped; she has been tried on an ICS and a SABA but she has not had consistent relief;  We  discussed these issues and the nature of RADS- I rec that we treat her again w/ an oral corticosteroid (try MEDROL 45m in a slow tapering schedule- see AVS), and add-in an ICS/LABA combination like [id number]- one inhalation Bid; she can still use the PROAIR 1-2 sprays every 4-6H as needed for wheezing & chest tightness, plus MUCINEX 6044mQid w/ fluids for the thick sticky sputum that has given her so much trouble... We will [place] I have asked her to call for problems and check back w/ me in about 6wks...      Plan:     Patient's Medications  New Prescriptions   FLUTICASONE-SALMETEROL (ADVAIR DISKUS) 500-50 MCG/DOSE AEPB    Inhale 1 puff into the lungs 2 (two) times daily.   METHYLPREDNISOLONE (MEDROL) 8 MG TABLET    Use as directed  Previous Medications   AMBULATORY NON FORMULARY MEDICATION    Tumeric 500 mg Takes as directed   AMBULATORY NON FORMULARY MEDICATION    Reservatrol drops for eyes OTC Takes as directed   ASPIRIN 81 MG TABLET    Take 81 mg by mouth daily.     BENZONATATE (TESSALON) 200 MG CAPSULE    TK 1 C PO Q 8 H PRF COUGH   BILBERRY, VACCINIUM MYRTILLUS, (BILBERRY PO)    Take 470 mg by mouth daily.   BIMATOPROST (LUMIGAN) 0.03 % OPHTHALMIC DROPS    1 drop at bedtime.     CALCIUM CITRATE-VITAMIN D 500-400 MG-UNIT CHEW    Chew by mouth 3 (three) times daily.   CHOLECALCIFEROL (VITAMIN D-3 PO)    Take by mouth. Takes 5000 by mouth once daily   CYANOCOBALAMIN (VITAMIN B 12 PO)    Take 1 tablet by mouth daily.   DORZOLAMIDE HCL-TIMOLOL MAL OP    Apply to eye as directed.   FLAXSEED MISC    1,000 mg.    IRBESARTAN (AVAPRO) 150 MG TABLET    Take 150 mg by mouth daily.   LUTEIN PO    Take 40 mg by mouth daily.    OMEGA-3 FATTY ACIDS (OMEGA 3 PO)    Take by mouth.     OMEPRAZOLE (PRILOSEC OTC)  20 MG TABLET    Take 20 mg by mouth daily before supper.   VITAMIN E 400 UNIT CAPSULE    Take 400 Units by mouth daily.    Modified Medications   Modified Medication Previous Medication   PROAIR RESPICLICK 1067890 BASE) MCG/ACT AEPB PROAIR RESPICLICK [id number] BASE) MCG/ACT AEPB      Take 2 puffs by mouth 4 (four) times daily as needed.    2 puffs 4 (four) times daily as needed.  Discontinued Medications   AMOXICILLIN-CLAVULANATE (AUGMENTIN) 875-125 MG PER TABLET    Take 1 tablet by mouth 2 (two) times daily.   HYDROCODONE-ACETAMINOPHEN (NORCO) 5-325 MG PER TABLET    Take 1-2 tablets by mouth every 6 (six) hours as needed for moderate pain or severe pain.

## 2014-08-11 ENCOUNTER — Institutional Professional Consult (permissible substitution): Payer: Medicare Other | Admitting: Pulmonary Disease

## 2014-08-27 ENCOUNTER — Telehealth: Payer: Self-pay | Admitting: Pulmonary Disease

## 2014-08-27 MED ORDER — DOXYCYCLINE HYCLATE 100 MG PO TABS: 100.0000 mg | ORAL_TABLET | Freq: Two times a day (BID) | ORAL | Status: DC

## 2014-08-27 NOTE — Telephone Encounter (Signed)
Spoke with pt, states she has almost finished her medrol dosepak- s/s coming back: sob, vocal changes, prod cough, ear congestion.  Pt still taking Mucinex, otc allergy med, taking medrol qod.   Pt is concerned she may have an infection in her lungs.  Pt uses [facility] on [address]  Last ov: 08/08/14 Next ov: 09/19/14  Dr. Lenna Gilford please advise.  Thanks!  Allergies  Allergen Reactions  . Biaxin [Clarithromycin] Other (See Comments)    achy    Current Outpatient Prescriptions on File Prior to Visit  Medication Sig Dispense Refill  . AMBULATORY NON FORMULARY MEDICATION Tumeric 500 mg Takes as directed    . AMBULATORY NON FORMULARY MEDICATION Reservatrol drops for eyes OTC Takes as directed    . aspirin 81 MG tablet Take 81 mg by mouth daily.      . benzonatate (TESSALON) 200 MG capsule TK 1 C PO Q 8 H PRF COUGH  1  . Bilberry, Vaccinium myrtillus, (BILBERRY PO) Take 470 mg by mouth daily.    . bimatoprost (LUMIGAN) 0.03 % ophthalmic drops 1 drop at bedtime.      . Calcium Citrate-Vitamin D 500-400 MG-UNIT CHEW Chew by mouth 3 (three) times daily.    . Cholecalciferol (VITAMIN D-3 PO) Take by mouth. Takes 5000 by mouth once daily    . Cyanocobalamin (VITAMIN B 12 PO) Take 1 tablet by mouth daily.    . DORZOLAMIDE HCL-TIMOLOL MAL OP Apply to eye as directed.    . Flaxseed MISC 1,000 mg.     . Fluticasone-Salmeterol (ADVAIR DISKUS) 500-50 MCG/DOSE AEPB Inhale 1 puff into the lungs 2 (two) times daily. 60 each 2  . irbesartan (AVAPRO) 150 MG tablet Take 150 mg by mouth daily.    . LUTEIN PO Take 40 mg by mouth daily.     . methylPREDNISolone (MEDROL) 8 MG tablet Use as directed 25 tablet 0  . Omega-3 Fatty Acids (OMEGA 3 PO) Take by mouth.      [name] omeprazole (PRILOSEC OTC) 20 MG tablet Take 20 mg by mouth daily before supper.    [name] PROAIR RESPICLICK 315 (90 BASE) MCG/ACT AEPB Take 2 puffs by mouth 4 (four) times daily as needed. 1 each 2  . vitamin E 400 UNIT capsule Take 400 Units by mouth  daily.       No current facility-administered medications on file prior to visit.

## 2014-08-27 NOTE — Telephone Encounter (Signed)
Pt aware of recs. RX sent in. Nothing further needed 

## 2014-08-27 NOTE — Telephone Encounter (Signed)
Per SN,  Call in doxycycline 150m #20 1 PO BID Continue on medrol taper until completed  Please advise pt

## 2014-08-29 ENCOUNTER — Ambulatory Visit: Payer: Medicare Other

## 2014-09-11 ENCOUNTER — Ambulatory Visit
Admission: RE | Admit: 2014-09-11 | Discharge: 2014-09-11 | Disposition: A | Discharge status: Home / Self Care | Payer: Medicare Other | Source: Ambulatory Visit | Attending: Internal Medicine | Admitting: Internal Medicine

## 2014-09-11 DIAGNOSIS — Z1231 Encounter for screening mammogram for malignant neoplasm of breast: Secondary | ICD-10-CM

## 2014-09-19 ENCOUNTER — Encounter: Payer: Self-pay | Admitting: Pulmonary Disease

## 2014-09-19 ENCOUNTER — Telehealth: Payer: Self-pay | Admitting: Pulmonary Disease

## 2014-09-19 ENCOUNTER — Encounter (INDEPENDENT_AMBULATORY_CARE_PROVIDER_SITE_OTHER): Payer: Self-pay

## 2014-09-19 ENCOUNTER — Ambulatory Visit (INDEPENDENT_AMBULATORY_CARE_PROVIDER_SITE_OTHER): Payer: Medicare Other | Admitting: Pulmonary Disease

## 2014-09-19 VITALS — BP 122/80 | HR 79 | Temp 98.8°F | Wt 227.0 lb

## 2014-09-19 DIAGNOSIS — J683 Other acute and subacute respiratory conditions due to chemicals, gases, fumes and vapors: Secondary | ICD-10-CM

## 2014-09-19 DIAGNOSIS — J45901 Unspecified asthma with (acute) exacerbation: Secondary | ICD-10-CM

## 2014-09-19 MED ORDER — PROAIR RESPICLICK 108 (90 BASE) MCG/ACT IN AEPB: 2.0000 | INHALATION_SPRAY | Freq: Four times a day (QID) | RESPIRATORY_TRACT | Status: DC | PRN

## 2014-09-19 MED ORDER — FLUTICASONE-SALMETEROL 500-50 MCG/DOSE IN AEPB: 1.0000 | INHALATION_SPRAY | Freq: Two times a day (BID) | RESPIRATORY_TRACT | Status: DC

## 2014-09-19 NOTE — Patient Instructions (Signed)
Today we updated your med list in our EPIC system...    Continue your current medications the same...  We refilled your [id number]- one inhalation twice daily.-- stay on this...    This is your anti-inflammatory medication to keep the bronchial tube inflamm under control...  You should also use the PROAIR-HFA inhaler as needed 1-2 sprays every 6H as needed for wheezing, chest tightness, etc...    This is your rescue inhaler for sudden symptoms should they occur...  You should also use the OTC MUCINEX 682m tabs-- one 4 times daily w/ fluids for the congestion...    This is your mucolytic agent used w/ water & fluids by mouth to keep the phlegm loose & easier to expectorate & clear out of your lungs...  Call for any questions...  Let's plan a follow up visit in [id number] if needed for acute problems.

## 2014-09-19 NOTE — Telephone Encounter (Signed)
Called and made pt aware of below. Nothing further needed

## 2014-09-19 NOTE — Telephone Encounter (Signed)
Patient is confused about her medications.  Patient wants to know if she is suppose to continue the Medrol and Flonase.  Current Outpatient Prescriptions on File Prior to Visit  Medication Sig Dispense Refill  . AMBULATORY NON FORMULARY MEDICATION Tumeric 500 mg Takes as directed    . AMBULATORY NON FORMULARY MEDICATION Reservatrol for eyes OTC Takes as directed    . aspirin 81 MG tablet Take 81 mg by mouth daily.      . benzonatate (TESSALON) 200 MG capsule TK 1 C PO Q 8 H PRF COUGH  1  . Bilberry, Vaccinium myrtillus, (BILBERRY PO) Take 470 mg by mouth daily.    . bimatoprost (LUMIGAN) 0.03 % ophthalmic drops 1 drop at bedtime.      . Calcium Citrate-Vitamin D 500-400 MG-UNIT CHEW Chew by mouth 3 (three) times daily.    . Cholecalciferol (VITAMIN D-3 PO) Take by mouth. Takes 5000 by mouth once daily    . Cyanocobalamin (VITAMIN B 12 PO) Take 1 tablet by mouth daily.    . DORZOLAMIDE HCL-TIMOLOL MAL OP Apply to eye as directed.    . fexofenadine (ALLEGRA) 180 MG tablet Take 180 mg by mouth daily.    . Flaxseed MISC 1,000 mg.     . fluticasone (FLONASE) 50 MCG/ACT nasal spray Place 2 sprays into both nostrils 2 (two) times daily as needed for allergies or rhinitis.    . Fluticasone-Salmeterol (ADVAIR DISKUS) 500-50 MCG/DOSE AEPB Inhale 1 puff into the lungs 2 (two) times daily. 180 each 1  . irbesartan (AVAPRO) 150 MG tablet Take 150 mg by mouth daily.    . LUTEIN PO Take 40 mg by mouth daily.     . methylPREDNISolone (MEDROL) 8 MG tablet Use as directed 25 tablet 0  . Omega-3 Fatty Acids (OMEGA 3 PO) Take by mouth.      [name] omeprazole (PRILOSEC OTC) 20 MG tablet Take 20 mg by mouth daily before supper.    [name] PROAIR RESPICLICK 514 (90 BASE) MCG/ACT AEPB Take 2 puffs by mouth 4 (four) times daily as needed. 3 each 2  . vitamin E 400 UNIT capsule Take 400 Units by mouth daily.       No current facility-administered medications on file prior to visit.    SN - please advise.

## 2014-09-19 NOTE — Telephone Encounter (Signed)
Called spoke with pt. Medication list updated. Nothing further needed

## 2014-09-19 NOTE — Telephone Encounter (Signed)
Per SN,  Please inform pt that she in not to take medrol. The course is complete. She can use flonase as needed for nasal symptoms

## 2014-09-20 ENCOUNTER — Encounter: Payer: Self-pay | Admitting: Pulmonary Disease

## 2014-09-20 NOTE — Progress Notes (Signed)
Subjective:     Patient ID: Ashley Parker, female   DOB: [birth year], 81 y.o.   MRN: [id number]  HPI ~  August 08, 2014:  Initial consult by SN>        89 y/o WF, friend of Audrea Muscat Little, and referred by Cecil Cobbs due to refractory AB/ RADS>  She is an ex-smoker but quit over 30 yrs ago, & she denies any hx of signif resp tract illnesses in the past;  She states that she had an upper resp infection in Feb-Mar2016 characterized by head congestion, drainage, cough w/ chest congestion & very thick beige/yellow mucus that would get stuck in her throat & make her choke;  She had assoc SOB/DOE but denied CP or f/c/s;  She was approriately treated by her PCP w/ antibiotics, Prednisone, Prilosec & anti-reflux regimen;  She notes that the Pred seemed to help for awhile but all the symptoms came roaring back & were hard to shake off;  Currently she notes cough, sm amt yellow sput, no hemoptysis, no CP, +SOB & she teaches water aerobics classes 5d/wk![name]..  Current Meds> Qvar80-2spBid, Proair pen (ran out), Tessalon perles as needed...       Ashley Parker is an ex-smoker> starting in her teens and smoked for 30+yrs up to 1ppd; she quit at age 34 & has not smoked in over 30 yrs now;  While she was smoking she denies much in the way of resp symptoms- denies cough, sput, hemoptysis, SOB, etc;  She denies signif resp illnesses in the past- never diagnosed w/ asthma, COPD, pneumonia, TB or known exposure; she has had bronchitis in the past- given antibiotics and once required an Albuterol inhaler which she recalls helped her symptoms;  She has been employed in retail & the office setting w/o known occupational respiratory exposures;  There is no FamHx of lung dis...  EXAM shows Afeb, VSS, O2sat=94%;  HEENT- neg, mallampati2;  Chest- few scat rhonchi at bases, no incr WOB, no signs of consolidation;  Heart- RR w/o m/r/g;  Abd- obese, soft, neg;  Ext- w/o c/c/e  CXR was done recently by DrAvva but we do not have this film or report to  review; only CXR in Epic/PACS was from 2010- norm heart size, mild hyperinflated, clear, NAD...  CT Angio Chest 12/2008 reviewed in Epic/PACS- neg for PE, norm heart size, mild coronary calcif noted, gallstone, mild fx T12, fusion T10-11 (?congen vs degen), NAD in the chest.  FullPFTs done 07/16/14 reviewed in Epic> FVC=2.96 (105%), FEV1=1.89 (90%), %1sec=64, mid-flows reduced at 71% predicted; Lung vols= wnl;  Diffusion is reduced at 63%  LABS 06/2014 by DrAvva> Chems- wnl x GPT=46, A1c=5.8;  CBC- wnl;  TSH=2.19;  VitD=39... 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) and precipitated by her URI 3 months ago; she responded to one of the Prednisone courses that she was prescribed but the symptoms returned after this med was stopped; she has been tried on an ICS and a SABA but she has not had consistent relief;  We discussed these issues and the nature of RADS- I rec that we treat her again w/ an oral corticosteroid (try MEDROL 37m in a slow tapering schedule- see AVS), and add-in an ICS/LABA combination like ADVAIR500- one inhalation Bid; she can still use the PROAIR 1-2 sprays every 4-6H as needed for wheezing & chest tightness, plus MUCINEX 6010mQid w/ fluids for the thick sticky sputum that has given her so much  trouble... We will request her recent CXR report from [facility], and I have asked her to call for problems and check back w/ me in about 6wks...  ADDENDUM >> CXR 05/06/14 report received from DrAvva's office- norm heart size, ectatic Ao, clear lungs, compression deformity in lower Tspine (stable, NAD).[name].  ~  September 19, 2014: 19moROV & recheck by SN>       Pt seen last month w/ ?refractory AB/RADS- eval showed clear CXR 04/2014 and mild airflow obstruction on PFT; we treated her w/ anti-inflamm Rx using Medrol taper, Advair500bid, Mucinex600Qid w/ fluids, & ProairHFA as needed;  She returns and indicates that she responded to the meds but says the congestion is still  present; on further questioning it is apparent that SMcRaestopped all of her meds when they ran out- didn't maintain on the Advair500Bid, didn't continue on the Mucinex600Qid w/ fluids etc; despite her complaints her chest exam is clear today... EXAM reveals Afeb, VSS, O2sat=95% on RA;  HEENT- neg, mallampati2;  Chest- clear w/o w/r/r;  Heart- RR w/o m/r/g;  Abd- obese, soft, neg;  Ext- w/o c/c/e...  IMP/PLAN>>  We reviewed her diagnosis & the need for on-going meds for her RADS;  Rec to restart ADVAIR500-Bid, MUCINEX 6053mid w/ fluids, and ProairHFA rescue inhaler as needed;  She is encouraged to get on diet, gradually increase exercise, get weight down... Plan ROV in 3-54m75mo check progress.    Past Medical History  Diagnosis Date  . Osteoporosis >> on calcium, vitamins, VitD   . Hypertension >> on Avapro150   . Obesity, unspecified >> 5' 6"  tall, 227#,  BMI=36-37   . Unspecified vitamin D deficiency   . Glaucoma   . Arthritis   . Macular degeneration   . Wrist fracture, left 1943, 1985  . Pelvic fracture 2007  . Hemorrhoids     Internal and External  . Stricture of esophagus     distal  . Fundic gland polyposis of stomach   . GERD (gastroesophageal reflux disease) >> on Prilosec20   . Crohn's ileitis     Mild, mostly asymptomatic, not on therapy    Past Surgical History  Procedure Laterality Date  . Cataract extraction    . Total knee arthroplasty Left 2007  . Colonoscopy  07/27/2001, 07/08/2011    Multiple  . Tonsillectomy and adenoidectomy    . Esophagogastroduodenoscopy      multiple  . Laparoscopic appendectomy N/A 02/19/2014    Procedure: APPENDECTOMY LAPAROSCOPIC;  Surgeon: ThoErroll LunaD;  [facility]  Laterality: N/A;    Outpatient Encounter Prescriptions as of 09/19/2014  Medication Sig  . AMBULATORY NON FORMULARY MEDICATION Tumeric 500 mg Takes as directed  . AMBULATORY NON FORMULARY MEDICATION Reservatrol for eyes OTC Takes as  directed  . aspirin 81 MG tablet Take 81 mg by mouth daily.    . BFloria Ravelingaccinium myrtillus, (BILBERRY PO) Take 470 mg by mouth daily.  . bimatoprost (LUMIGAN) 0.03 % ophthalmic drops 1 drop at bedtime.    . Calcium Citrate-Vitamin D 500-400 MG-UNIT CHEW Chew by mouth 3 (three) times daily.  . Cholecalciferol (VITAMIN D-3 PO) Take by mouth. Takes 5000 by mouth once daily  . Cyanocobalamin (VITAMIN B 12 PO) Take 1 tablet by mouth daily.  . DORZOLAMIDE HCL-TIMOLOL MAL OP Apply to eye as directed.  . Flaxseed MISC 1,000 mg.   . fluticasone (FLONASE) 50 MCG/ACT nasal spray Place 2 sprays into both nostrils 2 (two) times daily as  needed for allergies or rhinitis.  . Fluticasone-Salmeterol (ADVAIR DISKUS) 500-50 MCG/DOSE AEPB Inhale 1 puff into the lungs 2 (two) times daily.  . irbesartan (AVAPRO) 150 MG tablet Take 150 mg by mouth daily.  . LUTEIN PO Take 40 mg by mouth daily.   . methylPREDNISolone (MEDROL) 8 MG tablet Use as directed  . Omega-3 Fatty Acids (OMEGA 3 PO) Take by mouth.    [name] PROAIR RESPICLICK 882 (90 BASE) MCG/ACT AEPB Take 2 puffs by mouth 4 (four) times daily as needed.  . vitamin E 400 UNIT capsule Take 400 Units by mouth daily.    . [DISCONTINUED] Fluticasone-Salmeterol (ADVAIR DISKUS) 500-50 MCG/DOSE AEPB Inhale 1 puff into the lungs 2 (two) times daily.  . [DISCONTINUED] PROAIR RESPICLICK 800 (90 BASE) MCG/ACT AEPB Take 2 puffs by mouth 4 (four) times daily as needed.  . benzonatate (TESSALON) 200 MG capsule TK 1 C PO Q 8 H PRF COUGH  . omeprazole (PRILOSEC OTC) 20 MG tablet Take 20 mg by mouth daily before supper.  . [DISCONTINUED] doxycycline (VIBRA-TABS) 100 MG tablet Take 1 tablet (100 mg total) by mouth 2 (two) times daily. (Patient not taking: Reported on 09/19/2014)   No facility-administered encounter medications on file as of 09/19/2014.    Allergies  Allergen Reactions  . Biaxin [Clarithromycin] Other (See Comments)    achy     Immunization History   Administered Date(s) Administered  . Influenza Whole 11/26/2007, 12/28/2009  . Influenza-Unspecified 12/07/2013  . Pneumococcal Polysaccharide-23 08/09/2006  . Td 11/26/2007  PREVNAR-13 was given by DrAvva 02/11/14   Family History  Problem Relation Age of Onset  . Prostate cancer Father   . Breast cancer Mother   . Heart disease Mother   . Uterine cancer Mother   . Colon cancer Neg Hx     History   Social History  . Marital Status: Married    Spouse Name: N/A  . Number of Children: 5  . Years of Education: N/A   Occupational History  . Retired    Social History Main Topics  . Smoking status: Former Smoker -- 1.00 packs/day for 32 years    Types: Cigarettes    Quit date: 02/19/1984  . Smokeless tobacco: Never Used  . Alcohol Use: 0.0 oz/week    0 Standard drinks or equivalent per week     Comment: 1 drink daily   . Drug Use: No  . Sexual Activity: Not Currently   Other Topics Concern  . Not on file   Social History Narrative   Regular exercise-yes - aqua aerobics, walking 1hr x 5 each week   Married   5 children, 22 grandchildren, worked in past as garden Building services engineer, Education officer, community, Electrical engineer   Daily caffeine x 2    Current Medications, Allergies, Past Medical History, Past Surgical History, Family History, and Social History were reviewed in [employer] record.   Review of Systems             All symptoms NEG except where BOLDED >>  Constitutional:  F/C/S, fatigue, anorexia, unexpected weight change. HEENT:  HA, visual changes, hearing loss, earache, nasal symptoms, sore throat, mouth sores, hoarseness. Resp:  cough, sputum, hemoptysis; SOB, tightness, wheezing. Cardio:  CP, palpit, DOE, orthopnea, edema. GI:  N/V/D/C, blood in stool; reflux, abd pain, distention, gas. GU:  dysuria, freq, urgency, hematuria, flank pain, voiding difficulty. MS:  joint pain, swelling, tenderness, decr ROM; neck pain, back pain. Neuro:  HA,  tremors, seizures,  dizziness, syncope, weakness, numbness, gait abn. Skin:  suspicious lesions or skin rash. Heme:  adenopathy, bruising, bleeding. Psyche:  confusion, agitation, sleep disturbance, hallucinations, anxiety, depression suicidal.   Objective:   Physical Exam      Vital Signs:  Reviewed...  General:  WD, overweight, 81 y/o WF in NAD; alert & oriented; pleasant & cooperative... HEENT:  /AT; Conjunctiva- pink, Sclera- nonicteric, EOM-wnl, PERRLA, EACs-clear, TMs-wnl; NOSE-clear; THROAT-clear & wnl. Neck:  Supple w/ fairROM; no JVD; normal carotid impulses w/o bruits; no thyromegaly or nodules palpated; no lymphadenopathy. Chest:  Clear lungs, sl decr BS at bases but no wheezing/ rales/ rhonchi... Heart:  Regular Rhythm; norm S1 & S2 without murmurs, rubs, or gallops detected. Abdomen:  Obese, soft & nontender- no guarding or rebound; normal bowel sounds; no organomegaly or masses palpated. Ext:  Sl decr ROM; +arthritic changes; no varicose veins, +venous insuffic, no edema;  Pulses intact w/o bruits. Neuro:  No focal neuro deficits, gait normal & balance OK. Derm:  No lesions noted; no rash etc. Lymph:  No cervical, supraclavicular, axillary, or inguinal adenopathy palpated.   Assessment:      Mild obstructive lung dis by PFT 06/2014 => stay on Advair500Bid and Mucinex 621m Qid w/ fluids... RADS following a URI 04/2014 w/ refractory symptoms => improved after Medrol, Advair, Mucinex, Proair... Decreased DLCO of ?etiology Ex-smoker Obesity => we reviewed diet, exercise, wt reduction strategies...  IMP/PLAN>>  We reviewed her diagnosis & the need for on-going meds for her RADS;  Rec to restart ADVAIR500-Bid, MUCINEX 6080mid w/ fluids, and ProairHFA rescue inhaler as needed;  She is encouraged to get on diet, gradually increase exercise, get weight down... Plan ROV in 3-66m90mo check progress     Plan:       Patient's Medications  New Prescriptions   No medications on  file  Previous Medications   AMBULATORY NON FORMULARY MEDICATION    Tumeric 500 mg Takes as directed   AMBULATORY NON FORMULARY MEDICATION    Reservatrol for eyes OTC Takes as directed   ASPIRIN 81 MG TABLET    Take 81 mg by mouth daily.     BENZONATATE (TESSALON) 200 MG CAPSULE    TK 1 C PO Q 8 H PRF COUGH   BILBERRY, VACCINIUM MYRTILLUS, (BILBERRY PO)    Take 470 mg by mouth daily.   BIMATOPROST (LUMIGAN) 0.03 % OPHTHALMIC DROPS    1 drop at bedtime.     CALCIUM CITRATE-VITAMIN D 500-400 MG-UNIT CHEW    Chew by mouth 3 (three) times daily.   CHOLECALCIFEROL (VITAMIN D-3 PO)    Take by mouth. Takes 5000 by mouth once daily   CYANOCOBALAMIN (VITAMIN B 12 PO)    Take 1 tablet by mouth daily.   DORZOLAMIDE HCL-TIMOLOL MAL OP    Apply to eye as directed.   FEXOFENADINE (ALLEGRA) 180 MG TABLET    Take 180 mg by mouth daily.   FLAXSEED MISC    1,000 mg.    FLUTICASONE (FLONASE) 50 MCG/ACT NASAL SPRAY    Place 2 sprays into both nostrils 2 (two) times daily as needed for allergies or rhinitis.   IRBESARTAN (AVAPRO) 150 MG TABLET    Take 150 mg by mouth daily.   LUTEIN PO    Take 40 mg by mouth daily.    METHYLPREDNISOLONE (MEDROL) 8 MG TABLET    Use as directed   OMEGA-3 FATTY ACIDS (OMEGA 3 PO)    Take by mouth.  OMEPRAZOLE (PRILOSEC OTC) 20 MG TABLET    Take 20 mg by mouth daily before supper.   VITAMIN E 400 UNIT CAPSULE    Take 400 Units by mouth daily.    Modified Medications   Modified Medication Previous Medication   FLUTICASONE-SALMETEROL (ADVAIR DISKUS) 500-50 MCG/DOSE AEPB Fluticasone-Salmeterol (ADVAIR DISKUS) 500-50 MCG/DOSE AEPB      Inhale 1 puff into the lungs 2 (two) times daily.    Inhale 1 puff into the lungs 2 (two) times daily.   PROAIR RESPICLICK 177 (90 BASE) MCG/ACT AEPB PROAIR RESPICLICK 939 (90 BASE) MCG/ACT AEPB      Take 2 puffs by mouth 4 (four) times daily as needed.    Take 2 puffs by mouth 4 (four) times daily as needed.  Discontinued Medications   No  medications on file

## 2014-09-24 ENCOUNTER — Telehealth: Payer: Self-pay | Admitting: Pulmonary Disease

## 2014-09-24 NOTE — Telephone Encounter (Signed)
lmtcb x1 

## 2014-09-25 NOTE — Telephone Encounter (Signed)
Per SN: He is fine with her seeing RA. He is a great doctor and would like him very much.  Please advise RA thanks

## 2014-09-25 NOTE — Telephone Encounter (Signed)
Pt aware that we will get this message over to the MD's to switch over care. Aware that we will contact her as soon as we speak with both.  Dr Lenna Gilford please advise if you are okay with this switch. Thanks.

## 2014-10-01 NOTE — Telephone Encounter (Signed)
Do not have openings - pl have her see any of other partners

## 2014-10-01 NOTE — Telephone Encounter (Signed)
Spoke with pt and she is aware that RA is not accepting new pts at this time.  States her internist had recommended a provider but she did not write down his name.  She will call her internist for this recommendation and call back when she has this to see if they will take on her care.  Will await call.

## 2014-10-01 NOTE — Telephone Encounter (Signed)
lmtcb X1 for pt with family member to make aware that [facility] is not accepting new pts at this time.

## 2014-10-01 NOTE — Telephone Encounter (Signed)
Patient has appointment with SN on 12/22/14; wants to switch to [facility].  [facility] - ok to switch?  Per SN okay

## 2014-10-01 NOTE — Telephone Encounter (Signed)
Patient returned call.  May be reached at [phone].

## 2014-10-02 ENCOUNTER — Telehealth: Payer: Self-pay | Admitting: Pulmonary Disease

## 2014-10-02 NOTE — Telephone Encounter (Signed)
Spoke with Ashley Parker. She is wanting to switch from SN and originally stated RA but he did not have any openings and requested message be sent to other providers.  Ashley Parker is now requesting MR. Please advise thanks

## 2014-10-05 NOTE — Telephone Encounter (Signed)
Ok give 1st avail

## 2014-10-06 NOTE — Telephone Encounter (Signed)
Called and spoke to pt. Informed her we can schedule appt with MR. Appt made for 9.22.16. Pt verbalized understanding and denied any further questions or concerns at this time.

## 2014-11-20 ENCOUNTER — Encounter: Payer: Self-pay | Admitting: Internal Medicine

## 2014-11-20 ENCOUNTER — Ambulatory Visit (INDEPENDENT_AMBULATORY_CARE_PROVIDER_SITE_OTHER): Payer: Medicare Other | Admitting: Internal Medicine

## 2014-11-20 VITALS — BP 142/88 | HR 65 | Ht 66.0 in | Wt 226.0 lb

## 2014-11-20 DIAGNOSIS — B370 Candidal stomatitis: Secondary | ICD-10-CM | POA: Insufficient documentation

## 2014-11-20 DIAGNOSIS — R05 Cough: Secondary | ICD-10-CM | POA: Insufficient documentation

## 2014-11-20 DIAGNOSIS — R053 Chronic cough: Secondary | ICD-10-CM | POA: Insufficient documentation

## 2014-11-20 MED ORDER — NYSTATIN 100000 UNIT/ML MT SUSP: 5.0000 mL | Freq: Four times a day (QID) | OROMUCOSAL | Status: DC

## 2014-11-20 NOTE — Patient Instructions (Addendum)
ICD-9-CM ICD-10-CM   1. Chronic cough 786.2 R05   2. Oral thrush 112.0 B37.0     Do RSI cough score part 1 Do HRCT chest wo contrast STop Advair and other inhalers  STop Omega-3 fish oil if you can - can make acid reflux/cough worse # Oral thrush - For Oral thrush: Take Suspension (swish and swallow): 500,000 units 4 times/day for 5 days; swish in the mouth and retain for as long as possible (several minutes) before swallowing   Followup  -2-3 weeks from now after CT

## 2014-11-20 NOTE — Progress Notes (Signed)
Subjective:    Patient ID: Ashley Parker, female    DOB: 11-May-1931, 81 y.o.   MRN: [id number]  HPI   HPI ~  August 08, 2014:  Initial consult by SN>        73 y/o WF, friend of Ashley Parker, and referred by Cecil Cobbs due to refractory AB/ RADS>  She is an ex-smoker but quit over 30 yrs ago, & she denies any hx of signif resp tract illnesses in the past;  She states that she had an upper resp infection in Feb-Mar2016 characterized by head congestion, drainage, cough w/ chest congestion & very thick beige/yellow mucus that would get stuck in her throat & make her choke;  She had assoc SOB/DOE but denied CP or f/c/s;  She was approriately treated by her PCP w/ antibiotics, Prednisone, Prilosec & anti-reflux regimen;  She notes that the Pred seemed to help for awhile but all the symptoms came roaring back & were hard to shake off;  Currently she notes cough, sm amt yellow sput, no hemoptysis, no CP, +SOB & she teaches water aerobics classes 5d/wk![name]..  Current Meds> Qvar80-2spBid, Proair pen (ran out), Tessalon perles as needed...       Ashley Parker is an ex-smoker> starting in her teens and smoked for 30+yrs up to 1ppd; she quit at age 19 & has not smoked in over 30 yrs now;  While she was smoking she denies much in the way of resp symptoms- denies cough, sput, hemoptysis, SOB, etc;  She denies signif resp illnesses in the past- never diagnosed w/ asthma, COPD, pneumonia, TB or known exposure; she has had bronchitis in the past- given antibiotics and once required an Albuterol inhaler which she recalls helped her symptoms;  She has been employed in retail & the office setting w/o known occupational respiratory exposures;  There is no FamHx of lung dis...  EXAM shows Afeb, VSS, O2sat=94%;  HEENT- neg, mallampati2;  Chest- few scat rhonchi at bases, no incr WOB, no signs of consolidation;  Heart- RR w/o m/r/g;  Abd- obese, soft, neg;  Ext- w/o c/c/e  CXR was done recently by DrAvva but we do not have this film  or report to review; only CXR in Epic/PACS was from 2010- norm heart size, mild hyperinflated, clear, NAD...  CT Angio Chest 12/2008 reviewed in Epic/PACS- neg for PE, norm heart size, mild coronary calcif noted, gallstone, mild fx T12, fusion T10-11 (?congen vs degen), NAD in the chest.  FullPFTs done 07/16/14 reviewed in Epic> FVC=2.96 (105%), FEV1=1.89 (90%), %1sec=64, mid-flows reduced at 71% predicted; Lung vols= wnl;  Diffusion is reduced at 63%  LABS 06/2014 by DrAvva> Chems- wnl x GPT=46, A1c=5.8;  CBC- wnl;  TSH=2.19;  VitD=39... 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) and precipitated by her URI 3 months ago; she responded to one of the Prednisone courses that she was prescribed but the symptoms returned after this med was stopped; she has been tried on an ICS and a SABA but she has not had consistent relief;  We discussed these issues and the nature of RADS- I rec that we treat her again w/ an oral corticosteroid (try MEDROL 43m in a slow tapering schedule- see AVS), and add-in an ICS/LABA combination like ADVAIR500- one inhalation Bid; she can still use the PROAIR 1-2 sprays every 4-6H as needed for wheezing & chest tightness, plus MUCINEX 6067mQid w/ fluids for the thick sticky sputum that has  given her so much trouble... We will request her recent CXR report from [place], and I have asked her to call for problems and check back w/ me in about 6wks...  ADDENDUM >> CXR 05/06/14 report received from DrAvva's office- norm heart size, ectatic Ao, clear lungs, compression deformity in lower Tspine (stable, NAD).[name].  ~  September 19, 2014: 65moROV & recheck by SN>       Pt seen last month w/ ?refractory AB/RADS- eval showed clear CXR 04/2014 and mild airflow obstruction on PFT; we treated her w/ anti-inflamm Rx using Medrol taper, Advair500bid, Mucinex600Qid w/ fluids, & ProairHFA as needed;  She returns and indicates that she responded to the meds but says the congestion  is still present; on further questioning it is apparent that SKirklinstopped all of her meds when they ran out- didn't maintain on the Advair500Bid, didn't continue on the Mucinex600Qid w/ fluids etc; despite her complaints her chest exam is clear today... EXAM reveals Afeb, VSS, O2sat=95% on RA;  HEENT- neg, mallampati2;  Chest- clear w/o w/r/r;  Heart- RR w/o m/r/g;  Abd- obese, soft, neg;  Ext- w/o c/c/e...  IMP/PLAN>>  We reviewed her diagnosis & the need for on-going meds for her RADS;  Rec to restart ADVAIR500-Bid, MUCINEX 6030mid w/ fluids, and ProairHFA rescue inhaler as needed;  She is encouraged to get on diet, gradually increase exercise, get weight down... Plan ROV in 3-19m51mo check progress.    OV 11/20/2014  Chief Complaint  Patient presents with  . Follow-up    Pt switching from SN to MR. Pt denies SOBm pt states aerobics 4 times per week. Pt c/o prod cough with yellow mucus. Pt denies CP/tightness.    [age]ar old obese lady who is very functional. Transfer of care to Dr RamChase Callere to chronic cough. Cough started after a respiratory infection. The other associated symptoms like shortness of breath but they have resolved. Currently left with residual cough. This a previous history of heavy smoking 30 pack history but quit over 30 years ago. Currently on Advair but not fully helping. RSI cough score and cough details are below; score is 9. She had pulmonary function test in May 2016 that I personally visualized. The shows isolated reduction in diffusion capacity to 63% but otherwise normal. She is frustrated by her cough. Cough is of moderate intensity. It is persistent. No clear cut aggravating or relieving factors. Associated chest x-ray but done in 2010 that I personally visualized has clear lung fields but shows hyperinflation. Inhaler therapy is not working   Dr KouLorenza Cambridgeflux Symptom Index (> 13-15 suggestive of LPR cough) 0 -> 5  =  none ->severe problem 11/20/2014     Hoarseness of problem with voice 1  Clearing  Of Throat 1  Excess throat mucus or feeling of post nasal drip 1  Difficulty swallowing food, liquid or tablets 0  Cough after eating or lying down 0  Breathing difficulties or choking episodes 0  Troublesome or annoying cough 1  Sensation of something sticking in throat or lump in throat 1  Heartburn, chest pain, indigestion, or stomach acid coming up 1  TOTAL 9      has a past medical history of Osteoporosis; Hypertension; Obesity, unspecified; Unspecified vitamin D deficiency; Glaucoma; Arthritis; Macular degeneration; Wrist fracture, left (1943, 1985); Pelvic fracture (2007); Hemorrhoids; Stricture of esophagus; Fundic gland polyposis of stomach; GERD (gastroesophageal reflux disease); and Crohn's ileitis.   reports that she quit smoking about 30 years ago.  Her smoking use included Cigarettes. She has a 32 pack-year smoking history. She has never used smokeless tobacco.  Past Surgical History  Procedure Laterality Date  . Cataract extraction    . Total knee arthroplasty Left 2007  . Colonoscopy  07/27/2001, 07/08/2011    Multiple  . Tonsillectomy and adenoidectomy    . Esophagogastroduodenoscopy      multiple  . Laparoscopic appendectomy N/A 02/19/2014    Procedure: APPENDECTOMY LAPAROSCOPIC;  Surgeon: Erroll Luna, MD;  Location: [facility] OR;  Service: General;  Laterality: N/A;    Allergies  Allergen Reactions  . Biaxin [Clarithromycin] Other (See Comments)    achy     Immunization History  Administered Date(s) Administered  . Influenza Whole 11/26/2007, 12/28/2009  . Influenza-Unspecified 12/07/2013  . Pneumococcal Polysaccharide-23 08/09/2006  . Td 11/26/2007    Family History  Problem Relation Age of Onset  . Prostate cancer Father   . Breast cancer Mother   . Heart disease Mother   . Uterine cancer Mother   . Colon cancer Neg Hx      Current outpatient prescriptions:  .  AMBULATORY NON FORMULARY MEDICATION,  Reservatrol for eyes OTC Takes as directed, Disp: , Rfl:  .  aspirin 81 MG tablet, Take 81 mg by mouth daily.  , Disp: , Rfl:  .  Bilberry, Vaccinium myrtillus, (BILBERRY PO), Take 470 mg by mouth daily., Disp: , Rfl:  .  bimatoprost (LUMIGAN) 0.03 % ophthalmic drops, 1 drop at bedtime.  , Disp: , Rfl:  .  Calcium Citrate-Vitamin D 500-400 MG-UNIT CHEW, Chew by mouth 3 (three) times daily., Disp: , Rfl:  .  Cholecalciferol (VITAMIN D-3 PO), Take by mouth. Takes 5000 by mouth once daily, Disp: , Rfl:  .  Cyanocobalamin (VITAMIN B 12 PO), Take 1 tablet by mouth daily., Disp: , Rfl:  .  DORZOLAMIDE HCL-TIMOLOL MAL OP, Apply to eye as directed., Disp: , Rfl:  .  Flaxseed MISC, 1,000 mg. , Disp: , Rfl:  .  fluticasone (FLONASE) 50 MCG/ACT nasal spray, Place 2 sprays into both nostrils 2 (two) times daily as needed for allergies or rhinitis., Disp: , Rfl:  .  Fluticasone-Salmeterol (ADVAIR DISKUS) 500-50 MCG/DOSE AEPB, Inhale 1 puff into the lungs 2 (two) times daily., Disp: 180 each, Rfl: 1 .  irbesartan (AVAPRO) 150 MG tablet, Take 150 mg by mouth daily., Disp: , Rfl:  .  LUTEIN PO, Take 40 mg by mouth daily. , Disp: , Rfl:  .  Omega-3 Fatty Acids (OMEGA 3 PO), Take by mouth.  , Disp: , Rfl:  .  PROAIR RESPICLICK 016 (90 BASE) MCG/ACT AEPB, Take 2 puffs by mouth 4 (four) times daily as needed., Disp: 3 each, Rfl: 2 .  vitamin E 400 UNIT capsule, Take 400 Units by mouth daily.  , Disp: , Rfl:  .  AMBULATORY NON FORMULARY MEDICATION, Tumeric 500 mg Takes as directed, Disp: , Rfl:  .  omeprazole (PRILOSEC OTC) 20 MG tablet, Take 20 mg by mouth daily before supper., Disp: , Rfl:      Review of Systems  Constitutional: Negative for fever and unexpected weight change.  HENT: Negative for congestion, dental problem, ear pain, nosebleeds, postnasal drip, rhinorrhea, sinus pressure, sneezing, sore throat and trouble swallowing.   Eyes: Negative for redness and itching.  Respiratory: Positive for cough.  Negative for chest tightness, shortness of breath and wheezing.   Cardiovascular: Negative for palpitations and leg swelling.  Gastrointestinal: Negative for nausea and vomiting.  Genitourinary: Negative  for dysuria.  Musculoskeletal: Negative for joint swelling.  Skin: Negative for rash.  Neurological: Negative for headaches.  Hematological: Does not bruise/bleed easily.  Psychiatric/Behavioral: Negative for dysphoric mood. The patient is not nervous/anxious.        Objective:   Physical Exam  Constitutional: She is oriented to person, place, and time. She appears well-developed and well-nourished. No distress.  HENT:  Head: Normocephalic and atraumatic.  Right Ear: External ear normal.  Left Ear: External ear normal.  Mouth/Throat: Oropharynx is clear and moist. No oropharyngeal exudate.  Thrush +  Eyes: Conjunctivae and EOM are normal. Pupils are equal, round, and reactive to light. Right eye exhibits no discharge. Left eye exhibits no discharge. No scleral icterus.  Neck: Normal range of motion. Neck supple. No JVD present. No tracheal deviation present. No thyromegaly present.  Cardiovascular: Normal rate, regular rhythm, normal heart sounds and intact distal pulses.  Exam reveals no gallop and no friction rub.   No murmur heard. Pulmonary/Chest: Effort normal and breath sounds normal. No respiratory distress. She has no wheezes. She has no rales. She exhibits no tenderness.  Abdominal: Soft. Bowel sounds are normal. She exhibits no distension and no mass. There is no tenderness. There is no rebound and no guarding.  Musculoskeletal: Normal range of motion. She exhibits no edema or tenderness.  Lymphadenopathy:    She has no cervical adenopathy.  Neurological: She is alert and oriented to person, place, and time. She has normal reflexes. No cranial nerve deficit. She exhibits normal muscle tone. Coordination normal.  Skin: Skin is warm and dry. No rash noted. She is not  diaphoretic. No erythema. No pallor.  Psychiatric: She has a normal mood and affect. Her behavior is normal. Judgment and thought content normal.  Vitals reviewed.   Filed Vitals:   11/20/14 1140  BP: 142/88  Pulse: 65  Height: 5' 6"  (1.676 m)  Weight: 226 lb (102.513 kg)  SpO2: 96%   Estimated body mass index is 36.49 kg/(m^2) as calculated from the following:   Height as of this encounter: 5' 6"  (1.676 m).   Weight as of this encounter: 226 lb (102.513 kg).       Assessment & Plan:     ICD-9-CM ICD-10-CM   1. Chronic cough 786.2 R05 CT Chest High Resolution  2. Oral thrush 112.0 B37.0     #Chronic cough with prior smoking history and isolated reduction in diffusion capacity. Unimproved with inhaler therapy.  - Unchanged - Most likely residue of postviral reactive cough with upper airway irritation and cough neuropathy. However, - We'll do high resolution CT chest without contrast especially given persistent history and prior history of smoking and isolated reduction in diffusion capacity on PFT - Also stop Advair and other inhalers- stop omega-3 in case acid reflux is making cough worse   #Oral thrush - new problem - Treat with nystatin swish and swallow - Stop Advair   Dr. Brand Males, [name], [facility].C.P Pulmonary and Critical Care Medicine Staff Physician [place] [facility] Pager: [phone], If no answer or between  15:00h - 7:00h: call [phone]  11/26/2014 11:01 AM

## 2014-11-25 ENCOUNTER — Ambulatory Visit (INDEPENDENT_AMBULATORY_CARE_PROVIDER_SITE_OTHER)
Admission: RE | Admit: 2014-11-25 | Discharge: 2014-11-25 | Disposition: A | Discharge status: Home / Self Care | Payer: Medicare Other | Source: Ambulatory Visit | Attending: Internal Medicine | Admitting: Internal Medicine

## 2014-11-25 DIAGNOSIS — R05 Cough: Secondary | ICD-10-CM

## 2014-11-25 DIAGNOSIS — R053 Chronic cough: Secondary | ICD-10-CM

## 2014-11-26 ENCOUNTER — Telehealth: Payer: Self-pay | Admitting: Internal Medicine

## 2014-11-26 DIAGNOSIS — R911 Solitary pulmonary nodule: Secondary | ICD-10-CM

## 2014-11-26 NOTE — Telephone Encounter (Signed)
ATC received fast busy signal for pt. WCB  Called [facility] CT spoke w/ Stacy. She will do super-d disc and once done will be sent to [facility]

## 2014-11-26 NOTE — Telephone Encounter (Signed)
  REgardig CT results  1. Do Super-D disc and get it to Byrum   2. Let her know there is nodule in lung and could be related to cough - have her do PET scan  3. LEt her know that given prior smoking hx that she might need a bronch biopsy and to discuss that to meet with DR Byrum for eval, Make appt with hium. Let me know when and I will let him know    LEt patient know    Ct Chest High Resolution  11/25/2014   CLINICAL DATA:  81 year old female with history of persistent cough and congestion, productive of yellow sputum since February 2016. No relief with antibiotics. Evaluate for potential interstitial lung disease.  EXAM: CT CHEST WITHOUT CONTRAST  TECHNIQUE: Multidetector CT imaging of the chest was performed following the standard protocol without intravenous contrast. High resolution imaging of the lungs, as well as inspiratory and expiratory imaging, was performed.  COMPARISON:  Chest CT 01/21/2009.  FINDINGS: Mediastinum/Lymph Nodes: Heart size is normal. There is no significant pericardial fluid, thickening or pericardial calcification. There is atherosclerosis of the thoracic aorta, the great vessels of the mediastinum and the coronary arteries, including calcified atherosclerotic plaque in the left main, left anterior descending, left circumflex and right coronary arteries. No pathologically enlarged mediastinal or hilar lymph nodes. Moderate hiatal hernia. No axillary lymphadenopathy.  Lungs/Pleura: In the posterior aspect of the left upper lobe abutting the superior aspect of the left major fissure there is a mixed ground-glass attenuation and solid nodule, which has a 2.5 x 1.8 cm ground-glass attenuation component (image 9 of series 5), and a central 8 mm solid component (image 9 of series 2). In retrospect, in this region on prior study 01/21/2009, there was a much smaller ground-glass attenuation nodule that was sub cm in size. These findings are highly concerning for slow-growing  neoplasm such as a primary bronchogenic adenocarcinoma. In the posterior aspect of the right upper lobe there is a 2.2 x 2.7 cm ground-glass attenuation area that is slightly nodular in appearance (image 14 of series 5). No acute consolidative airspace disease. No pleural effusions. High-resolution images demonstrate no areas of significant subpleural reticulation, parenchymal banding, traction bronchiectasis or frank honeycombing. Inspiratory and expiratory imaging demonstrates some mild air trapping, indicative of mild small airways disease.  Upper abdomen: Diffuse low attenuation throughout the hepatic parenchyma, compatible with hepatic steatosis.  Musculoskeletal: Old chronic appearing compression fracture of T12 with approximately 20% loss of anterior vertebral body height. There are no aggressive appearing lytic or blastic lesions noted in the visualized portions of the skeleton.  IMPRESSION: 1. No findings to suggest interstitial lung disease. 2. Mixed solid and subsolid nodule in the posterior aspect of the left upper lobe, as discussed above, highly concerning for slow-growing neoplasm such as adenocarcinoma. Given the central 8 mm solid component, developing invasive adenocarcinoma is suspected, and correlation with biopsy or surgical excision is recommended. 3. In addition, there is a separate 2.2 x 2.7 cm ground-glass attenuation lesion in the posterior right upper lobe, which is nonspecific, but could be concerning for separate adenocarcinoma. Attention on followup studies is recommended. 4. Atherosclerosis, including left main and 3 vessel coronary artery disease. These results will be called to the ordering clinician or representative by the Radiologist Assistant, and communication documented in the PACS or zVision Dashboard.   Electronically Signed   By: Vinnie Langton [name]   On: 11/25/2014 18:40

## 2014-11-27 NOTE — Telephone Encounter (Signed)
Spoke with pt regarding results. PET scan has been ordered. appt scheduled to see RB on 12/30/14 for consult. Super D disc will be sent to [facility] wants done. FYI to MR and Daneil Dan.

## 2014-11-28 ENCOUNTER — Telehealth: Payer: Self-pay | Admitting: Internal Medicine

## 2014-11-28 NOTE — Telephone Encounter (Signed)
Called and spoke with pt and informed of message below  Will send back to MR as requested

## 2014-11-28 NOTE — Telephone Encounter (Signed)
Spoke with the pt  She is requesting further explanation of ct chest done 11/25/14  She was able to view report on Mychart and is very concerned  Would like MR to call her to discuss  Please advise thanks

## 2014-11-28 NOTE — Telephone Encounter (Signed)
Rob  This is a Dr Lenna Gilford patient who transferred to me for cough. CT shows nodule GGO big. Having PET/Super-D and seeing yuou 01/09/15  Thanks  Dr. Brand Males, [name], F.C.C.P Pulmonary and Critical Care Medicine Staff Physician [place] [facility] Pager: [phone], If no answer or between  15:00h - 7:00h: call [phone]  11/28/2014 8:59 AM

## 2014-11-28 NOTE — Telephone Encounter (Signed)
Please tell her I will try and call her Monday 12/01/14 in PM. Send note back to me. Office very busy rigt now. Glad to talk to her. IF I have a minute I can  Call today but doubt

## 2014-12-02 NOTE — Telephone Encounter (Signed)
MR please advise on pt's results. Thanks.

## 2014-12-02 NOTE — Telephone Encounter (Signed)
Thanks

## 2014-12-02 NOTE — Telephone Encounter (Signed)
Gave her result. Explained likely ddx is early stage lung ca . Having pet 10/11/6 and will see me same day. Cough some worse - will d.w me at fu.

## 2014-12-02 NOTE — Telephone Encounter (Signed)
Called to give test result of CT. 11:35 AM 12/02/2014 will call back

## 2014-12-02 NOTE — Telephone Encounter (Signed)
Pt will be back by 5 and we can call [phone]

## 2014-12-09 ENCOUNTER — Ambulatory Visit (INDEPENDENT_AMBULATORY_CARE_PROVIDER_SITE_OTHER): Payer: Medicare Other | Admitting: Internal Medicine

## 2014-12-09 ENCOUNTER — Encounter: Payer: Self-pay | Admitting: Internal Medicine

## 2014-12-09 ENCOUNTER — Ambulatory Visit (HOSPITAL_COMMUNITY)
Admission: RE | Admit: 2014-12-09 | Discharge: 2014-12-09 | Disposition: A | Discharge status: Home / Self Care | Payer: Medicare Other | Source: Ambulatory Visit | Attending: Internal Medicine | Admitting: Internal Medicine

## 2014-12-09 VITALS — BP 124/76 | HR 78 | Ht 66.0 in | Wt 230.0 lb

## 2014-12-09 DIAGNOSIS — R911 Solitary pulmonary nodule: Secondary | ICD-10-CM

## 2014-12-09 DIAGNOSIS — R05 Cough: Secondary | ICD-10-CM | POA: Diagnosis not present

## 2014-12-09 DIAGNOSIS — I700 Atherosclerosis of aorta: Secondary | ICD-10-CM | POA: Insufficient documentation

## 2014-12-09 DIAGNOSIS — K5730 Diverticulosis of large intestine without perforation or abscess without bleeding: Secondary | ICD-10-CM | POA: Diagnosis not present

## 2014-12-09 DIAGNOSIS — R918 Other nonspecific abnormal finding of lung field: Secondary | ICD-10-CM | POA: Insufficient documentation

## 2014-12-09 DIAGNOSIS — N281 Cyst of kidney, acquired: Secondary | ICD-10-CM | POA: Insufficient documentation

## 2014-12-09 DIAGNOSIS — R053 Chronic cough: Secondary | ICD-10-CM

## 2014-12-09 LAB — NITRIC OXIDE: Nitric Oxide: 56

## 2014-12-09 LAB — GLUCOSE, CAPILLARY: GLUCOSE-CAPILLARY: 101 mg/dL — AB (ref 65–99)

## 2014-12-09 MED ORDER — FLUDEOXYGLUCOSE F - 18 (FDG) INJECTION
11.7000 | Freq: Once | INTRAVENOUS | Status: DC | PRN
  Administered 2014-12-09: 11.7 via INTRAVENOUS
  Filled 2014-12-09: qty 11.7

## 2014-12-09 NOTE — Patient Instructions (Addendum)
ICD-9-CM ICD-10-CM   1. Lung nodule 793.11 R91.1   2. Chronic cough 786.2 R05     #lung nodule - new isse - doubt playing a role in cough   - respect desire not to undergo biopsy  - cancel appt with DR Byrum - do followup CT chest wo contrast Super-D protocol in 3-4 months  #Cough - definite cough variant asthma playing a role - Start  ASMANEX 2 puff twice daily - take samples   #Followup  6-8 weeks to see progress  RSI cough score and feno at followup  If worse at anytime call us or return sooner

## 2014-12-09 NOTE — Progress Notes (Signed)
Subjective:     Patient ID: Ashley Parker, female   DOB: [birth year], 81 y.o.   MRN: [id number]  HPI     HPI ~  August 08, 2014:  Initial consult by SN>        71 y/o WF, friend of Ashley Parker, and referred by Cecil Parker due to refractory AB/ RADS>  She is an ex-smoker but quit over 30 yrs ago, & she denies any hx of signif resp tract illnesses in the past;  She states that she had an upper resp infection in Feb-Mar2016 characterized by head congestion, drainage, cough w/ chest congestion & very thick beige/yellow mucus that would get stuck in her throat & make her choke;  She had assoc SOB/DOE but denied CP or f/c/s;  She was approriately treated by her PCP w/ antibiotics, Prednisone, Prilosec & anti-reflux regimen;  She notes that the Pred seemed to help for awhile but all the symptoms came roaring back & were hard to shake off;  Currently she notes cough, sm amt yellow sput, no hemoptysis, no CP, +SOB & she teaches water aerobics classes 5d/wk![name]..  Current Meds> Qvar80-2spBid, Proair pen (ran out), Tessalon perles as needed...       Ashley Parker is an ex-smoker> starting in her teens and smoked for 30+yrs up to 1ppd; she quit at age 22 & has not smoked in over 30 yrs now;  While she was smoking she denies much in the way of resp symptoms- denies cough, sput, hemoptysis, SOB, etc;  She denies signif resp illnesses in the past- never diagnosed w/ asthma, COPD, pneumonia, TB or known exposure; she has had bronchitis in the past- given antibiotics and once required an Albuterol inhaler which she recalls helped her symptoms;  She has been employed in retail & the office setting w/o known occupational respiratory exposures;  There is no FamHx of lung dis...  EXAM shows Afeb, VSS, O2sat=94%;  HEENT- neg, mallampati2;  Chest- few scat rhonchi at bases, no incr WOB, no signs of consolidation;  Heart- RR w/o m/r/g;  Abd- obese, soft, neg;  Ext- w/o c/c/e  CXR was done recently by DrAvva but we do not have this film  or report to review; only CXR in Epic/PACS was from 2010- norm heart size, mild hyperinflated, clear, NAD...  CT Angio Chest 12/2008 reviewed in Epic/PACS- neg for PE, norm heart size, mild coronary calcif noted, gallstone, mild fx T12, fusion T10-11 (?congen vs degen), NAD in the chest.  FullPFTs done 07/16/14 reviewed in Epic> FVC=2.96 (105%), FEV1=1.89 (90%), %1sec=64, mid-flows reduced at 71% predicted; Lung vols= wnl;  Diffusion is reduced at 63%  LABS 06/2014 by DrAvva> Chems- wnl x GPT=46, A1c=5.8;  CBC- wnl;  TSH=2.19;  VitD=39... 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) and precipitated by her URI 3 months ago; she responded to one of the Prednisone courses that she was prescribed but the symptoms returned after this med was stopped; she has been tried on an ICS and a SABA but she has not had consistent relief;  We discussed these issues and the nature of RADS- I rec that we treat her again w/ an oral corticosteroid (try MEDROL 63m in a slow tapering schedule- see AVS), and add-in an ICS/LABA combination like ADVAIR500- one inhalation Bid; she can still use the PROAIR 1-2 sprays every 4-6H as needed for wheezing & chest tightness, plus MUCINEX 6062mQid w/ fluids for the thick sticky sputum that  has given her so much trouble... We will request her recent CXR report from [place], and I have asked her to call for problems and check back w/ me in about 6wks...  ADDENDUM >> CXR 05/06/14 report received from DrAvva's office- norm heart size, ectatic Ao, clear lungs, compression deformity in lower Tspine (stable, NAD).[name].  ~  September 19, 2014: 48moROV & recheck by SN>       Pt seen last month w/ ?refractory AB/RADS- eval showed clear CXR 04/2014 and mild airflow obstruction on PFT; we treated her w/ anti-inflamm Rx using Medrol taper, Advair500bid, Mucinex600Qid w/ fluids, & ProairHFA as needed;  She returns and indicates that she responded to the meds but says the congestion  is still present; on further questioning it is apparent that [place] all of her meds when they ran out- didn't maintain on the Advair500Bid, didn't continue on the Mucinex600Qid w/ fluids etc; despite her complaints her chest exam is clear today... EXAM reveals Afeb, VSS, O2sat=95% on RA;  HEENT- neg, mallampati2;  Chest- clear w/o w/r/r;  Heart- RR w/o m/r/g;  Abd- obese, soft, neg;  Ext- w/o c/c/e...  IMP/PLAN>>  We reviewed her diagnosis & the need for on-going meds for her RADS;  Rec to restart ADVAIR500-Bid, MUCINEX 60564mid w/ fluids, and ProairHFA rescue inhaler as needed;  She is encouraged to get on diet, gradually increase exercise, get weight down... Plan ROV in 3-64m88mo check progress.    OV 11/20/2014  Chief Complaint  Patient presents with  . Follow-up    Pt switching from SN to MR. Pt denies SOBm pt states aerobics 4 times per week. Pt c/o prod cough with yellow mucus. Pt denies CP/tightness.    [age]ar old obese lady who is very functional. Transfer of care to Dr RamChase Callere to chronic cough. Cough started after a respiratory infection. The other associated symptoms like shortness of breath but they have resolved. Currently left with residual cough. This a previous history of heavy smoking 30 pack history but quit over 30 years ago. Currently on Advair but not fully helping. RSI cough score and cough details are below; score is 9. She had pulmonary function test in May 2016 that I personally visualized. The shows isolated reduction in diffusion capacity to 63% but otherwise normal. She is frustrated by her cough. Cough is of moderate intensity. It is persistent. No clear cut aggravating or relieving factors. Associated chest x-ray but done in 2010 that I personally visualized has clear lung fields but shows hyperinflation. Inhaler therapy is not working      OV 12/09/2014  Chief Complaint  Patient presents with  . Follow-up    Pt here after HRCT. Pt states she has an  increase in chest congestion since being off the advair. Pt c/o prod cough with yellow mucus. Pt denies CP/tightness.      Follow-up refractory chronic cough. Last visit 11/20/2014. At that time I instructed her to stop fish oil and Advair and do a CT chest and come back for follow-up. She is here for follow-up. She says that since stopping fish oil and Advair she states the cough is somewhat worse with increased mucus production. She thinks this is because she stopped her Advair. She did have CT scan of the chest that did not show any interstitial lung disease but did show left upper lobe nodule that is groundglass density at 2.5cm. She had a PET scan today that I personally visualized. The formal report is pending. To  the best of my knowledge and and inability looks like a low uptake on PET scan. She will originally agreed to meet with Dr. Lamonte Sakai to undergo navigational bronchoscopy but today she prefers that she just follow a weight and watch approach.  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 Formal report pending   reports that she quit smoking about 30 years ago. Her smoking use included Cigarettes. She has a 32 pack-year smoking history. She has never used smokeless tobacco.   Dr Lorenza Cambridge Reflux Symptom Index (> 13-15 suggestive of LPR cough)  11/20/2014  12/09/2014 After stopping advair,  Hoarseness of problem with voice 1 1  Clearing  Of Throat 1 3  Excess throat mucus or feeling of post nasal drip 1 3  Difficulty swallowing food, liquid or tablets 0 2  Cough after eating or lying down 0 1  Breathing difficulties or choking episodes 0 0  Troublesome or annoying cough 1 4  Sensation of something sticking in throat or lump in throat 1 2  Heartburn, chest pain, indigestion, or stomach acid coming up 1 2  TOTAL 9 18       Review of Systems  Positive for worsening cough as documented above     Objective:   Physical Exam  Constitutional: She is oriented to person, place, and time. She appears well-developed and well-nourished. No distress.  Body mass index is 37.14 kg/(m^2).   HENT:  Head: Normocephalic and atraumatic.  Right Ear: External ear normal.  Left Ear: External ear normal.  Mouth/Throat: Oropharynx is clear and moist. No oropharyngeal exudate.  Hearing aid right side  Eyes: Conjunctivae and EOM are normal. Pupils are equal, round, and reactive to light. Right eye exhibits no discharge. Left eye exhibits no discharge. No scleral icterus.  Neck: Normal range of motion. Neck supple. No JVD present. No tracheal deviation present. No thyromegaly present.  Cardiovascular: Normal rate, regular rhythm, normal heart sounds and intact distal pulses.  Exam reveals no gallop and no friction rub.   No murmur heard. Pulmonary/Chest: Effort normal and breath sounds normal. No respiratory distress. She has no wheezes. She has no rales. She exhibits no tenderness.  Abdominal: Soft. Bowel sounds are normal. She exhibits no distension and no mass. There is no tenderness. There is no rebound and no guarding.  Musculoskeletal: Normal range of motion. She exhibits no edema or tenderness.  Lymphadenopathy:    She has no cervical adenopathy.  Neurological: She is alert and oriented to person, place, and time. She has normal reflexes. No cranial nerve deficit. She exhibits normal muscle tone. Coordination normal.  Skin: Skin is warm and dry. No rash noted. She is not diaphoretic. No erythema. No pallor.  Psychiatric: She has a normal mood and affect. Her behavior is normal. Judgment and thought content normal.  Vitals reviewed.   Filed Vitals:   12/09/14 1457  BP: 124/76  Pulse: 78  Height: 5' 6"  (1.676 m)  Weight: 230 lb (104.327 kg)  SpO2: 96%        Assessment:       ICD-9-CM ICD-10-CM   1.  Lung nodule 793.11 R91.1   2. Chronic cough 786.2 R05        Plan:      #lung nodule - new issuew  - Overall given size of 2.5 cm and groundglass opacity and prior  smoking history I would read this as at least intermediate probably ready for non-small cell lung cancer early stage. Particularly I educated her that this could be a slow-growing bronchoalveolar cell carcinoma. Even though the PET scan to me looks like low uptake I set her up with an appointment to see Dr. Baltazar Apo navigation bronchoscopy this [I had a super D of the CT made]. However she tells me that she is leery of procedures and she prefers that she would follow this along. She is not interested in undergoing surgical lung biopsy or navigational bronchoscopy biopsy or CT-guided biopsy. Even recommended perceptive test through moderate sedation bronchoscopy daughter the pretest probably ready that this might be lung cancer but she declined   Plan - doubt playing a role in cough   - respect desire not to undergo biopsy  - cancel appt with DR Lamonte Sakai - do followup CT chest wo contrast Super-D protocol in 3-4 months  #Cough - deteriorated after stopping asdvair - definite cough variant asthma playing a role - Start  ASMANEX 2 puff twice daily - take samples - try another mdi other than advair   #Followup  6-8 weeks to see progress  RSI cough score and feno at followup  If worse at anytime call us or return sooner   > 50% of this > 25 min visit spent in face to face counseling or coordination of care    Dr. Brand Males, [name], [facility].C.P Pulmonary and Critical Care Medicine Staff Physician [facility] Pager: [phone], If no answer or between  15:00h - 7:00h: call [phone]  12/09/2014 3:34 PM

## 2014-12-22 ENCOUNTER — Ambulatory Visit: Payer: Medicare Other | Admitting: Pulmonary Disease

## 2014-12-30 ENCOUNTER — Institutional Professional Consult (permissible substitution): Payer: Medicare Other | Admitting: Emergency Medicine

## 2015-01-05 ENCOUNTER — Telehealth: Payer: Self-pay | Admitting: Internal Medicine

## 2015-01-05 MED ORDER — PREDNISONE 10 MG PO TABS: ORAL_TABLET | ORAL | Status: DC

## 2015-01-05 MED ORDER — CEPHALEXIN 500 MG PO CAPS: 500.0000 mg | ORAL_CAPSULE | Freq: Three times a day (TID) | ORAL | Status: DC

## 2015-01-05 NOTE — Telephone Encounter (Signed)
Left message for patient to call back  

## 2015-01-05 NOTE — Telephone Encounter (Signed)
Spoke with pt. Aware of recs below. RX's sent in. Nothing further needed

## 2015-01-05 NOTE — Telephone Encounter (Signed)
Pt calling back [phone] (H)

## 2015-01-05 NOTE — Telephone Encounter (Signed)
Spoke with pt. She was given asmanex at last OV 10/11. Reports she has been taking this as instructed. On Friday night she started wheezing, prod cough (clear-grey phlem). She restarted her proair respiclick and started OTC mucous relief. Wants to make sure this is okay to do and if any further recs? Pt has pending appt 11/22. Please advise MR thanks

## 2015-01-05 NOTE — Telephone Encounter (Signed)
Might be having an asthma flare up. IF she is using pro-air atleats 1-2 times per day then I recommend - cephalexin 571m tid x 5 days - Please take prednisone 40 mg x1 day, then 30 mg x1 day, then 20 mg x1 day, then 10 mg x1 day, and then 5 mg x1 day and stop    Allergies  Allergen Reactions  . Biaxin [Clarithromycin] Other (See Comments)    achy

## 2015-01-20 ENCOUNTER — Ambulatory Visit (INDEPENDENT_AMBULATORY_CARE_PROVIDER_SITE_OTHER): Payer: Medicare Other | Admitting: Internal Medicine

## 2015-01-20 ENCOUNTER — Encounter: Payer: Self-pay | Admitting: Internal Medicine

## 2015-01-20 VITALS — BP 132/82 | HR 65 | Ht 66.0 in | Wt 223.0 lb

## 2015-01-20 DIAGNOSIS — J45991 Cough variant asthma: Secondary | ICD-10-CM

## 2015-01-20 DIAGNOSIS — R05 Cough: Secondary | ICD-10-CM | POA: Diagnosis not present

## 2015-01-20 DIAGNOSIS — R911 Solitary pulmonary nodule: Secondary | ICD-10-CM

## 2015-01-20 DIAGNOSIS — R053 Chronic cough: Secondary | ICD-10-CM

## 2015-01-20 LAB — NITRIC OXIDE: Nitric Oxide: 25

## 2015-01-20 NOTE — Progress Notes (Signed)
Subjective:     Patient ID: Ashley Parker, female   DOB: [birth year], 81 y.o.   MRN: [id number]  HPI     HPI ~  August 08, 2014:  Initial consult by SN>        30 y/o WF, friend of Ashley Parker, and referred by Ashley Parker due to refractory AB/ RADS>  She is an ex-smoker but quit over 30 yrs ago, & she denies any hx of signif resp tract illnesses in the past;  She states that she had an upper resp infection in Feb-Mar2016 characterized by head congestion, drainage, cough w/ chest congestion & very thick beige/yellow mucus that would get stuck in her throat & make her choke;  She had assoc SOB/DOE but denied CP or f/c/s;  She was approriately treated by her PCP w/ antibiotics, Prednisone, Prilosec & anti-reflux regimen;  She notes that the Pred seemed to help for awhile but all the symptoms came roaring back & were hard to shake off;  Currently she notes cough, sm amt yellow sput, no hemoptysis, no CP, +SOB & she teaches water aerobics classes 5d/wk![name]..  Current Meds> Qvar80-2spBid, Proair pen (ran out), Tessalon perles as needed...       Ashley Parker is an ex-smoker> starting in her teens and smoked for 30+yrs up to 1ppd; she quit at age 65 & has not smoked in over 30 yrs now;  While she was smoking she denies much in the way of resp symptoms- denies cough, sput, hemoptysis, SOB, etc;  She denies signif resp illnesses in the past- never diagnosed w/ asthma, COPD, pneumonia, TB or known exposure; she has had bronchitis in the past- given antibiotics and once required an Albuterol inhaler which she recalls helped her symptoms;  She has been employed in retail & the office setting w/o known occupational respiratory exposures;  There is no FamHx of lung dis...  EXAM shows Afeb, VSS, O2sat=94%;  HEENT- neg, mallampati2;  Chest- few scat rhonchi at bases, no incr WOB, no signs of consolidation;  Heart- RR w/o m/r/g;  Abd- obese, soft, neg;  Ext- w/o c/c/e  CXR was done recently by DrAvva but we do not have this film  or report to review; only CXR in Epic/PACS was from 2010- norm heart size, mild hyperinflated, clear, NAD...  CT Angio Chest 12/2008 reviewed in Epic/PACS- neg for PE, norm heart size, mild coronary calcif noted, gallstone, mild fx T12, fusion T10-11 (?congen vs degen), NAD in the chest.  FullPFTs done 07/16/14 reviewed in Epic> FVC=2.96 (105%), FEV1=1.89 (90%), %1sec=64, mid-flows reduced at 71% predicted; Lung vols= wnl;  Diffusion is reduced at 63%  LABS 06/2014 by DrAvva> Chems- wnl x GPT=46, A1c=5.8;  CBC- wnl;  TSH=2.19;  VitD=39... 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) and precipitated by her URI 3 months ago; she responded to one of the Prednisone courses that she was prescribed but the symptoms returned after this med was stopped; she has been tried on an ICS and a SABA but she has not had consistent relief;  We discussed these issues and the nature of RADS- I rec that we treat her again w/ an oral corticosteroid (try MEDROL 21m in a slow tapering schedule- see AVS), and add-in an ICS/LABA combination like ADVAIR500- one inhalation Bid; she can still use the PROAIR 1-2 sprays every 4-6H as needed for wheezing & chest tightness, plus MUCINEX 6068mQid w/ fluids for the thick sticky sputum that  has given her so much trouble... We will request her recent CXR report from [facility], and I have asked her to call for problems and check back w/ me in about 6wks...  ADDENDUM >> CXR 05/06/14 report received from DrAvva's office- norm heart size, ectatic Ao, clear lungs, compression deformity in lower Tspine (stable, NAD).[name].  ~  September 19, 2014: 38moROV & recheck by SN>       Pt seen last month w/ ?refractory AB/RADS- eval showed clear CXR 04/2014 and mild airflow obstruction on PFT; we treated her w/ anti-inflamm Rx using Medrol taper, Advair500bid, Mucinex600Qid w/ fluids, & ProairHFA as needed;  She returns and indicates that she responded to the meds but says the congestion  is still present; on further questioning it is apparent that SArapahoestopped all of her meds when they ran out- didn't maintain on the Advair500Bid, didn't continue on the Mucinex600Qid w/ fluids etc; despite her complaints her chest exam is clear today... EXAM reveals Afeb, VSS, O2sat=95% on RA;  HEENT- neg, mallampati2;  Chest- clear w/o w/r/r;  Heart- RR w/o m/r/g;  Abd- obese, soft, neg;  Ext- w/o c/c/e...  IMP/PLAN>>  We reviewed her diagnosis & the need for on-going meds for her RADS;  Rec to restart ADVAIR500-Bid, MUCINEX 60458mid w/ fluids, and ProairHFA rescue inhaler as needed;  She is encouraged to get on diet, gradually increase exercise, get weight down... Plan ROV in 3-58m60mo check progress.    OV 11/20/2014  Chief Complaint  Patient presents with  . Follow-up    Pt switching from SN to MR. Pt denies SOBm pt states aerobics 4 times per week. Pt c/o prod cough with yellow mucus. Pt denies CP/tightness.    [age]ar old obese lady who is very functional. Transfer of care to Dr RamChase Callere to chronic cough. Cough started after a respiratory infection. The other associated symptoms like shortness of breath but they have resolved. Currently left with residual cough. This a previous history of heavy smoking 30 pack history but quit over 30 years ago. Currently on Advair but not fully helping. RSI cough score and cough details are below; score is 9. She had pulmonary function test in May 2016 that I personally visualized. The shows isolated reduction in diffusion capacity to 63% but otherwise normal. She is frustrated by her cough. Cough is of moderate intensity. It is persistent. No clear cut aggravating or relieving factors. Associated chest x-ray but done in 2010 that I personally visualized has clear lung fields but shows hyperinflation. Inhaler therapy is not working      OV 12/09/2014  Chief Complaint  Patient presents with  . Follow-up    Pt here after HRCT. Pt states she has an  increase in chest congestion since being off the advair. Pt c/o prod cough with yellow mucus. Pt denies CP/tightness.      Follow-up refractory chronic cough. Last visit 11/20/2014. At that time I instructed her to stop fish oil and Advair and do a CT chest and come back for follow-up. She is here for follow-up. She says that since stopping fish oil and Advair she states the cough is somewhat worse with increased mucus production. She thinks this is because she stopped her Advair. She did have CT scan of the chest that did not show any interstitial lung disease but did show left upper lobe nodule that is groundglass density at 2.5cm. She had a PET scan today that I personally visualized. The formal report is pending. To  the best of my knowledge and and inability looks like a low uptake on PET scan. She will originally agreed to meet with Dr. Lamonte Sakai to undergo navigational bronchoscopy but today she prefers that she just follow a weight and watch approach.  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 Formal report pending   reports that she quit smoking about 30 years ago. Her smoking use included Cigarettes. She has a 32 pack-year smoking history. She has never used smokeless tobacco.   OV 01/20/2015   Chief Complaint  Patient presents with  . Follow-up    Pt states she completed the abx and pred that was  given on 11/7. Pt states she is improved since completing treatment. Pt states she occasioanlly has a tickle in her throat making her cough. Pt denies SOB and CP/tightness.    Follow-up refractory chronic cough associated with cough variant asthma.  > Last visit 12/09/2014 started Asmanex after seeing an increase in exhaled nitric oxide consistent with eosinophilic airway inflammation.. After this she reported an improvement in symptoms.  Then on 01/05/2015 she called in with an asthma flareup symptoms. Gave her cephalexin and prednisone burst over the phone. This also helped. Currently she is much improved. RSI cough score is improved to 13 as can be seen below. She has no other complaints. She is feeling good. Of note she had oral thrush recently and that is also resolved. Exhaled nitric oxide today is normal at 25  Lung nodule: She has follow-up CT chest January 2017. She has opted for surveillance strategy. She has intermediate probably be lung nodule.    Dr Lorenza Cambridge Reflux Symptom Index (> 13-15 suggestive of LPR cough)  11/20/2014  12/09/2014 After stopping advair,-> FENO 50s 01/20/2015  opn asmanex + Rx aeasthma on 01/05/15 via phone. Feno 25  Hoarseness of problem with voice 1 1 0  Clearing  Of Throat 1 3 3   Excess throat mucus or feeling of post nasal drip 1 3 3   Difficulty swallowing food, liquid or tablets 0 2 0  Cough after eating or lying down 0 1 0  Breathing difficulties or choking episodes 0 0 3  Troublesome or annoying cough 1 4 4   Sensation of something sticking in throat or lump in throat 1 2 0  Heartburn, chest pain, indigestion, or stomach acid coming up 1 2 0  TOTAL 9 18 13        Current outpatient prescriptions:  .  AMBULATORY NON FORMULARY MEDICATION, Tumeric 500 mg Takes as directed, Disp: , Rfl:  .  AMBULATORY NON FORMULARY MEDICATION, Reservatrol for eyes OTC Takes as directed, Disp: , Rfl:  .  aspirin 81 MG tablet, Take 81 mg by mouth daily.  , Disp: , Rfl:  .  bimatoprost (LUMIGAN) 0.03 % ophthalmic drops, 1 drop at bedtime.  , Disp: , Rfl:  .  Calcium Citrate-Vitamin D 500-400 MG-UNIT CHEW, Chew by mouth 3 (three) times daily., Disp: , Rfl:  .  Cholecalciferol (VITAMIN D-3 PO), Take by mouth. Takes 5000 by mouth once daily, Disp: , Rfl:  .  Cyanocobalamin (VITAMIN B 12 PO), Take 1 tablet by mouth daily., Disp: , Rfl:  .  DORZOLAMIDE HCL-TIMOLOL MAL OP, Apply to eye as directed., Disp: ,  Rfl:  .  Flaxseed MISC, 1,000 mg. , Disp: , Rfl:  .  fluticasone (FLONASE) 50 MCG/ACT  nasal spray, Place 2 sprays into both nostrils 2 (two) times daily as needed for allergies or rhinitis., Disp: , Rfl:  .  irbesartan (AVAPRO) 150 MG tablet, Take 150 mg by mouth daily., Disp: , Rfl:  .  LUTEIN PO, Take 40 mg by mouth daily. , Disp: , Rfl:  .  mometasone (ASMANEX) 220 MCG/INH inhaler, Inhale 2 puffs into the lungs daily., Disp: , Rfl:  .  omeprazole (PRILOSEC OTC) 20 MG tablet, Take 20 mg by mouth daily before supper., Disp: , Rfl:  .  vitamin C (ASCORBIC ACID) 250 MG tablet, Take 250 mg by mouth daily., Disp: , Rfl:  .  vitamin E 400 UNIT capsule, Take 400 Units by mouth daily.  , Disp: , Rfl:   Allergies  Allergen Reactions  . Biaxin [Clarithromycin] Other (See Comments)    achy     Immunization History  Administered Date(s) Administered  . Influenza Whole 11/26/2007, 12/28/2009  . Influenza,inj,Quad PF,36+ Mos 12/20/2014  . Influenza-Unspecified 12/07/2013  . Pneumococcal Polysaccharide-23 08/09/2006  . Td 11/26/2007     Review of Systems Per hpi    Objective:   Physical Exam  Constitutional: She is oriented to person, place, and time. She appears well-developed and well-nourished. No distress.  obese  HENT:  Head: Normocephalic and atraumatic.  Right Ear: External ear normal.  Left Ear: External ear normal.  Mouth/Throat: Oropharynx is clear and moist. No oropharyngeal exudate.  Eyes: Conjunctivae and EOM are normal. Pupils are equal, round, and reactive to light. Right eye exhibits no discharge. Left eye exhibits no discharge. No scleral icterus.  Neck: Normal range of motion. Neck supple. No JVD present. No tracheal deviation present. No thyromegaly present.  Cardiovascular: Normal rate, regular rhythm, normal heart sounds and intact distal pulses.  Exam reveals no gallop and no friction rub.   No murmur heard. Pulmonary/Chest: Effort normal and breath sounds normal.  No respiratory distress. She has no wheezes. She has no rales. She exhibits no tenderness.  clear  Abdominal: Soft. Bowel sounds are normal. She exhibits no distension and no mass. There is no tenderness. There is no rebound and no guarding.  Musculoskeletal: Normal range of motion. She exhibits no edema or tenderness.  Lymphadenopathy:    She has no cervical adenopathy.  Neurological: She is alert and oriented to person, place, and time. She has normal reflexes. No cranial nerve deficit. She exhibits normal muscle tone. Coordination normal.  Skin: Skin is warm and dry. No rash noted. She is not diaphoretic. No erythema. No pallor.  Psychiatric: She has a normal mood and affect. Her behavior is normal. Judgment and thought content normal.  Vitals reviewed.   Filed Vitals:   01/20/15 1525  BP: 132/82  Pulse: 65  Height: 5' 6"  (1.676 m)  Weight: 223 lb (101.152 kg)  SpO2: 96%         Assessment:       ICD-9-CM ICD-10-CM   1. Chronic cough 786.2 R05   2. Cough variant asthma 493.82 J45.991   3. Lung nodule 793.11 R91.1        Plan:      #lung nodule - new isse - Future Appointments Date Time Provider [place]  03/11/2015 10:00 AM LBCT-CT 1 LBCT-CT LB-[facility]   - will call with results  #Cough - improved: - definite cough variant asthma playing a role - Continue   ASMANEX 2 puff twice daily - take samples  - we can see if we can price  one that is cheaper   #Followup   Jan 2017/Feb 2017 after CT chest - first avail is fine   Dr. Brand Males, [name], [facility].C.P Pulmonary and Critical Care Medicine Staff Physician [place] [facility] Pager: [phone], If no answer or between  15:00h - 7:00h: call [phone]  01/20/2015 3:47 PM        Future Appointments Date Time Provider [place]  03/11/2015 10:00 AM LBCT-CT 1 LBCT-CT LB-[facility]

## 2015-01-20 NOTE — Patient Instructions (Addendum)
ICD-9-CM ICD-10-CM   1. Chronic cough 786.2 R05   2. Cough variant asthma 493.82 J45.991   3. Lung nodule 793.11 R91.1     #lung nodule - new isse - Future Appointments Date Time Provider [place]  03/11/2015 10:00 AM LBCT-CT 1 LBCT-CT LB-CT [facility]   - will call with results  #Cough - improved: - definite cough variant asthma playing a role - Continue   ASMANEX 2 puff twice daily - take samples if we have them   #Followup   Jan 2017/Feb 2017 after CT chest - first avail is fine

## 2015-01-20 NOTE — Addendum Note (Signed)
Addended by: Maurice March on: 01/20/2015 05:23 PM   Modules accepted: Orders

## 2015-03-11 ENCOUNTER — Ambulatory Visit (INDEPENDENT_AMBULATORY_CARE_PROVIDER_SITE_OTHER)
Admission: RE | Admit: 2015-03-11 | Discharge: 2015-03-11 | Disposition: A | Discharge status: Home / Self Care | Payer: PPO | Source: Ambulatory Visit | Attending: Internal Medicine | Admitting: Internal Medicine

## 2015-03-11 DIAGNOSIS — R911 Solitary pulmonary nodule: Secondary | ICD-10-CM | POA: Diagnosis not present

## 2015-03-12 DIAGNOSIS — J309 Allergic rhinitis, unspecified: Secondary | ICD-10-CM | POA: Diagnosis not present

## 2015-03-12 DIAGNOSIS — H547 Unspecified visual loss: Secondary | ICD-10-CM | POA: Diagnosis not present

## 2015-03-12 DIAGNOSIS — K5090 Crohn's disease, unspecified, without complications: Secondary | ICD-10-CM | POA: Diagnosis not present

## 2015-03-12 DIAGNOSIS — J449 Chronic obstructive pulmonary disease, unspecified: Secondary | ICD-10-CM | POA: Diagnosis not present

## 2015-03-12 DIAGNOSIS — E119 Type 2 diabetes mellitus without complications: Secondary | ICD-10-CM | POA: Diagnosis not present

## 2015-03-12 DIAGNOSIS — Z6836 Body mass index (BMI) 36.0-36.9, adult: Secondary | ICD-10-CM | POA: Diagnosis not present

## 2015-03-12 DIAGNOSIS — I10 Essential (primary) hypertension: Secondary | ICD-10-CM | POA: Diagnosis not present

## 2015-03-12 DIAGNOSIS — Z1389 Encounter for screening for other disorder: Secondary | ICD-10-CM | POA: Diagnosis not present

## 2015-03-20 ENCOUNTER — Telehealth: Payer: Self-pay | Admitting: Internal Medicine

## 2015-03-20 NOTE — Telephone Encounter (Signed)
  IMPRESSION: 1. Mixed ground-glass and solid nodule in the left upper lobe, as on recent prior examinations, highly worrisome for primary bronchogenic carcinoma. 2. Amorphous ground-glass in the posterior segment right upper lobe is nonspecific. Low-grade adenocarcinoma cannot be excluded. Continued attention on followup exams is warranted. 3. Coronary artery calcification. 4. Small to moderate hiatal hernia. 5. Hepatic steatosis. 6. Cholelithiasis. 7. Left adrenal adenoma or mild lipoma.   Electronically Signed By: Lorin Picket [name] On: 03/11/2015 11:52                     CT chest - shopws persistent of nodule LUL - continued worry for lung cancer but not changed since prior CT . Wil discuss at fu in feb 2017   Dr. Brand Males, [name], F.C.C.P Pulmonary and Critical Care Medicine Staff Physician [place] [facility] Pager: [phone], If no answer or between  15:00h - 7:00h: call [phone]  03/20/2015 6:09 PM

## 2015-03-23 ENCOUNTER — Telehealth: Payer: Self-pay | Admitting: Internal Medicine

## 2015-03-23 MED ORDER — MOMETASONE FUROATE 220 MCG/INH IN AEPB: 2.0000 | INHALATION_SPRAY | Freq: Two times a day (BID) | RESPIRATORY_TRACT | Status: DC

## 2015-03-23 NOTE — Telephone Encounter (Signed)
lmtcb for pt.  

## 2015-03-23 NOTE — Telephone Encounter (Signed)
Called pt. She [facility]. She does 2 puffs BID. I have done so. Nothing further needed

## 2015-03-23 NOTE — Telephone Encounter (Signed)
ATC pt, line busy WCB 

## 2015-03-23 NOTE — Telephone Encounter (Signed)
Called pt and line rang numerous times. No VM, no answer. WCB

## 2015-03-24 NOTE — Telephone Encounter (Signed)
lmtcb for pt.  

## 2015-03-24 NOTE — Telephone Encounter (Signed)
Pt is aware of results. 

## 2015-03-24 NOTE — Telephone Encounter (Signed)
I wqill need some input from you via her insurance what is cheaper instad of Asamnex ?

## 2015-03-24 NOTE — Telephone Encounter (Signed)
Spoke with pt. She wants an alterative to Asmanex. This going to cost her $84 for a 22 day supply.  MR- please advise. Thanks.

## 2015-03-25 NOTE — Telephone Encounter (Signed)
lmtcb x1 for pt. 

## 2015-03-26 NOTE — Telephone Encounter (Signed)
[phone], pt cb

## 2015-03-26 NOTE — Telephone Encounter (Signed)
Left message for patient to call back  

## 2015-03-26 NOTE — Telephone Encounter (Signed)
Patient says she has Advair.  She wants to know if she should go back to taking these instead of the Asmanex.   [place] [phone]   Asmanex -- Copay is $85 Flovent Diskus -- Copay is $45   They did not have any other alternatives. MR - please advise.

## 2015-03-31 NOTE — Telephone Encounter (Signed)
Yes go back to advair or she can do flovent disk low dose

## 2015-03-31 NOTE — Telephone Encounter (Signed)
LMTCB x 1 

## 2015-04-01 NOTE — Telephone Encounter (Signed)
Called home number- NA and no VM option  Called mobile and NA and VM not set up  [facility]

## 2015-04-02 NOTE — Telephone Encounter (Signed)
ATC both numbers listed. NA and no voicemail set up.  wcb

## 2015-04-03 NOTE — Telephone Encounter (Signed)
ATC PT at both numbers listed, NA and no VM. WCB

## 2015-04-06 MED ORDER — FLUTICASONE PROPIONATE (INHAL) 50 MCG/BLIST IN AEPB: 1.0000 | INHALATION_SPRAY | Freq: Two times a day (BID) | RESPIRATORY_TRACT | Status: DC

## 2015-04-06 NOTE — Telephone Encounter (Signed)
lmtcb for pt.  

## 2015-04-06 NOTE — Telephone Encounter (Signed)
Spoke with pt, states she wishes to start flovent.  Pt request we send a 1 month rx to [facility] on file to see if she likes it.  Pt states if it works well she will call for a 90 day rx to her mail order pharmacy. This has been sent.  Nothing further needed.

## 2015-04-06 NOTE — Telephone Encounter (Signed)
Pt returning call and can be reached @ same.Ashley Parker

## 2015-04-14 DIAGNOSIS — H16101 Unspecified superficial keratitis, right eye: Secondary | ICD-10-CM | POA: Diagnosis not present

## 2015-04-14 DIAGNOSIS — H401133 Primary open-angle glaucoma, bilateral, severe stage: Secondary | ICD-10-CM | POA: Diagnosis not present

## 2015-04-14 DIAGNOSIS — H16141 Punctate keratitis, right eye: Secondary | ICD-10-CM | POA: Diagnosis not present

## 2015-04-14 DIAGNOSIS — H353130 Nonexudative age-related macular degeneration, bilateral, stage unspecified: Secondary | ICD-10-CM | POA: Diagnosis not present

## 2015-04-14 DIAGNOSIS — Z961 Presence of intraocular lens: Secondary | ICD-10-CM | POA: Diagnosis not present

## 2015-04-20 ENCOUNTER — Ambulatory Visit: Payer: Medicare Other | Admitting: Internal Medicine

## 2015-05-02 DIAGNOSIS — M67912 Unspecified disorder of synovium and tendon, left shoulder: Secondary | ICD-10-CM | POA: Diagnosis not present

## 2015-05-08 ENCOUNTER — Encounter (HOSPITAL_COMMUNITY): Payer: Self-pay | Admitting: Emergency Medicine

## 2015-05-08 ENCOUNTER — Emergency Department (HOSPITAL_COMMUNITY): Payer: PPO

## 2015-05-08 ENCOUNTER — Emergency Department (HOSPITAL_COMMUNITY)
Admission: EM | Admit: 2015-05-08 | Discharge: 2015-05-08 | Disposition: A | Discharge status: Home / Self Care | Payer: PPO | Attending: Emergency Medicine | Admitting: Emergency Medicine

## 2015-05-08 DIAGNOSIS — W1839XA Other fall on same level, initial encounter: Secondary | ICD-10-CM | POA: Diagnosis not present

## 2015-05-08 DIAGNOSIS — Z79899 Other long term (current) drug therapy: Secondary | ICD-10-CM | POA: Diagnosis not present

## 2015-05-08 DIAGNOSIS — E559 Vitamin D deficiency, unspecified: Secondary | ICD-10-CM | POA: Insufficient documentation

## 2015-05-08 DIAGNOSIS — Z7982 Long term (current) use of aspirin: Secondary | ICD-10-CM | POA: Diagnosis not present

## 2015-05-08 DIAGNOSIS — J45909 Unspecified asthma, uncomplicated: Secondary | ICD-10-CM | POA: Insufficient documentation

## 2015-05-08 DIAGNOSIS — S79912A Unspecified injury of left hip, initial encounter: Secondary | ICD-10-CM | POA: Diagnosis not present

## 2015-05-08 DIAGNOSIS — Y9389 Activity, other specified: Secondary | ICD-10-CM | POA: Diagnosis not present

## 2015-05-08 DIAGNOSIS — S32512A Fracture of superior rim of left pubis, initial encounter for closed fracture: Secondary | ICD-10-CM | POA: Insufficient documentation

## 2015-05-08 DIAGNOSIS — S8992XA Unspecified injury of left lower leg, initial encounter: Secondary | ICD-10-CM | POA: Diagnosis not present

## 2015-05-08 DIAGNOSIS — Z8669 Personal history of other diseases of the nervous system and sense organs: Secondary | ICD-10-CM | POA: Insufficient documentation

## 2015-05-08 DIAGNOSIS — E669 Obesity, unspecified: Secondary | ICD-10-CM | POA: Diagnosis not present

## 2015-05-08 DIAGNOSIS — Z7951 Long term (current) use of inhaled steroids: Secondary | ICD-10-CM | POA: Diagnosis not present

## 2015-05-08 DIAGNOSIS — K219 Gastro-esophageal reflux disease without esophagitis: Secondary | ICD-10-CM | POA: Insufficient documentation

## 2015-05-08 DIAGNOSIS — Y9289 Other specified places as the place of occurrence of the external cause: Secondary | ICD-10-CM | POA: Insufficient documentation

## 2015-05-08 DIAGNOSIS — M8580 Other specified disorders of bone density and structure, unspecified site: Secondary | ICD-10-CM | POA: Diagnosis not present

## 2015-05-08 DIAGNOSIS — M1990 Unspecified osteoarthritis, unspecified site: Secondary | ICD-10-CM | POA: Diagnosis not present

## 2015-05-08 DIAGNOSIS — Z87891 Personal history of nicotine dependence: Secondary | ICD-10-CM | POA: Insufficient documentation

## 2015-05-08 DIAGNOSIS — Y998 Other external cause status: Secondary | ICD-10-CM | POA: Insufficient documentation

## 2015-05-08 DIAGNOSIS — I10 Essential (primary) hypertension: Secondary | ICD-10-CM | POA: Insufficient documentation

## 2015-05-08 DIAGNOSIS — T148 Other injury of unspecified body region: Secondary | ICD-10-CM | POA: Diagnosis not present

## 2015-05-08 DIAGNOSIS — S32592A Other specified fracture of left pubis, initial encounter for closed fracture: Secondary | ICD-10-CM | POA: Diagnosis not present

## 2015-05-08 DIAGNOSIS — S32502A Unspecified fracture of left pubis, initial encounter for closed fracture: Secondary | ICD-10-CM | POA: Diagnosis not present

## 2015-05-08 DIAGNOSIS — M79605 Pain in left leg: Secondary | ICD-10-CM | POA: Diagnosis not present

## 2015-05-08 LAB — CBC WITH DIFFERENTIAL/PLATELET
BASOS ABS: 0 10*3/uL (ref 0.0–0.1)
BASOS PCT: 0 %
Eosinophils Absolute: 0.3 10*3/uL (ref 0.0–0.7)
Eosinophils Relative: 3 %
HEMATOCRIT: 45.4 % (ref 36.0–46.0)
HEMOGLOBIN: 14.5 g/dL (ref 12.0–15.0)
Lymphocytes Relative: 19 %
Lymphs Abs: 2.1 10*3/uL (ref 0.7–4.0)
MCH: 29.4 pg (ref 26.0–34.0)
MCHC: 31.9 g/dL (ref 30.0–36.0)
MCV: 91.9 fL (ref 78.0–100.0)
Monocytes Absolute: 0.8 10*3/uL (ref 0.1–1.0)
Monocytes Relative: 7 %
NEUTROS ABS: 8.1 10*3/uL — AB (ref 1.7–7.7)
NEUTROS PCT: 71 %
Platelets: 320 10*3/uL (ref 150–400)
RBC: 4.94 MIL/uL (ref 3.87–5.11)
RDW: 14.4 % (ref 11.5–15.5)
WBC: 11.3 10*3/uL — ABNORMAL HIGH (ref 4.0–10.5)

## 2015-05-08 LAB — COMPREHENSIVE METABOLIC PANEL
ALBUMIN: 4.4 g/dL (ref 3.5–5.0)
ALT: 58 U/L — AB (ref 14–54)
AST: 39 U/L (ref 15–41)
Alkaline Phosphatase: 76 U/L (ref 38–126)
Anion gap: 7 (ref 5–15)
BUN: 24 mg/dL — AB (ref 6–20)
CHLORIDE: 106 mmol/L (ref 101–111)
CO2: 24 mmol/L (ref 22–32)
CREATININE: 0.76 mg/dL (ref 0.44–1.00)
Calcium: 8.8 mg/dL — ABNORMAL LOW (ref 8.9–10.3)
GFR calc Af Amer: >60 mL/min (ref >60)
Glucose, Bld: 101 mg/dL — ABNORMAL HIGH (ref 65–99)
Potassium: 4.2 mmol/L (ref 3.5–5.1)
Sodium: 137 mmol/L (ref 135–145)
Total Bilirubin: 1 mg/dL (ref 0.3–1.2)
Total Protein: 7.6 g/dL (ref 6.5–8.1)

## 2015-05-08 MED ORDER — OXYCODONE-ACETAMINOPHEN 5-325 MG PO TABS: 1.0000 | ORAL_TABLET | ORAL | Status: DC | PRN

## 2015-05-08 MED ORDER — SODIUM CHLORIDE 0.9 % IV BOLUS (SEPSIS)
1000.0000 mL | Freq: Once | INTRAVENOUS | Status: AC
  Administered 2015-05-08: 1000 mL via INTRAVENOUS

## 2015-05-08 MED ORDER — FENTANYL CITRATE (PF) 100 MCG/2ML IJ SOLN
100.0000 ug | Freq: Once | INTRAMUSCULAR | Status: AC
  Administered 2015-05-08: 100 ug via INTRAVENOUS
  Filled 2015-05-08: qty 2

## 2015-05-08 NOTE — ED Notes (Signed)
Ptar called.

## 2015-05-08 NOTE — ED Notes (Signed)
Per EMS, Pt from home, yesterday pt was trying to help husband get TV out of car and pt fell on her bottom when trying to catch the TV. Pt was able to get up and walk, denies LOC. Denies pain except for pain in L pelvis/hip. Pt c/o pain upon weight bearing and lateral movement. Denies pain to palpation. Pt has hx of R pelvis fracture a few years ago. No shortening or rotation. A&Ox4. Pt smells of strong urine.

## 2015-05-08 NOTE — Progress Notes (Signed)
CM reviewed in details medicare guidelines, home health [facility]) (length of stay in home, types of [facility] staff available, coverage, primary caregiver, up to 24 hrs before services may be started) and Private duty nursing (PDN-coverage, length of stay in the home types of staff available). CM reviewed availability of [facility] to assist pcp to get pt to snf (if desired disposition) from the community level. CM provided pt/family with a list of [place] home health agencies and PDN.  Pt choice of agency is [facility] Had services before from this agency Pt voided [address] clear urine in female urinal Provided urinal for d/c home Confirms has bedside commode, rw, shower chair at home and a specialized bed Female at bedside will be primary care giver Spoke with Pura Spice fo [facility] [phone] for hhRN & PT services  Encouraged pt to call [facility] if no return call later today Pt states it is fine if [facility] does not arrive today

## 2015-05-08 NOTE — ED Notes (Signed)
Bed: WA20 Expected date:  Expected time:  Means of arrival:  Comments: EMS-fall-pelvic pain

## 2015-05-09 ENCOUNTER — Other Ambulatory Visit: Payer: Self-pay | Admitting: Internal Medicine

## 2015-05-09 DIAGNOSIS — Z7982 Long term (current) use of aspirin: Secondary | ICD-10-CM | POA: Diagnosis not present

## 2015-05-09 DIAGNOSIS — I10 Essential (primary) hypertension: Secondary | ICD-10-CM | POA: Diagnosis not present

## 2015-05-09 DIAGNOSIS — S32501D Unspecified fracture of right pubis, subsequent encounter for fracture with routine healing: Secondary | ICD-10-CM | POA: Diagnosis not present

## 2015-05-09 DIAGNOSIS — E669 Obesity, unspecified: Secondary | ICD-10-CM | POA: Diagnosis not present

## 2015-05-09 DIAGNOSIS — E668 Other obesity: Secondary | ICD-10-CM | POA: Diagnosis not present

## 2015-05-09 DIAGNOSIS — M1990 Unspecified osteoarthritis, unspecified site: Secondary | ICD-10-CM | POA: Diagnosis not present

## 2015-05-09 DIAGNOSIS — K219 Gastro-esophageal reflux disease without esophagitis: Secondary | ICD-10-CM | POA: Diagnosis not present

## 2015-05-09 DIAGNOSIS — Z9181 History of falling: Secondary | ICD-10-CM | POA: Diagnosis not present

## 2015-05-09 DIAGNOSIS — K5090 Crohn's disease, unspecified, without complications: Secondary | ICD-10-CM | POA: Diagnosis not present

## 2015-05-09 DIAGNOSIS — E559 Vitamin D deficiency, unspecified: Secondary | ICD-10-CM | POA: Diagnosis not present

## 2015-05-09 NOTE — ED Provider Notes (Signed)
CSN: [id number]     Arrival date & time 05/08/15  1121 History   First MD Initiated Contact with Patient 05/08/15 1127     Chief Complaint  Patient presents with  . Fall  . Hip Pain     (Consider location/radiation/quality/duration/timing/severity/associated sxs/prior Treatment) Patient is a 82 y.o. female presenting with fall and hip pain.  Fall This is a new problem. The current episode started yesterday. The problem occurs constantly. The problem has not changed since onset.Pertinent negatives include no abdominal pain and no headaches. The symptoms are aggravated by walking. Nothing relieves the symptoms. She has tried nothing for the symptoms.  Hip Pain Pertinent negatives include no abdominal pain and no headaches.    Past Medical History  Diagnosis Date  . Osteoporosis   . Hypertension   . Obesity, unspecified   . Unspecified vitamin D deficiency   . Glaucoma   . Arthritis   . Macular degeneration   . Wrist fracture, left 1943, 1985  . Pelvic fracture ([place]) 2007  . Hemorrhoids     Internal and External  . Stricture of esophagus     distal  . Fundic gland polyposis of stomach   . GERD (gastroesophageal reflux disease)   . Crohn's ileitis ([place])     Mild, mostly asymptomatic, not on therapy  . Cough variant asthma 01/20/2015   Past Surgical History  Procedure Laterality Date  . Cataract extraction    . Total knee arthroplasty Left 2007  . Colonoscopy  07/27/2001, 07/08/2011    Multiple  . Tonsillectomy and adenoidectomy    . Esophagogastroduodenoscopy      multiple  . Laparoscopic appendectomy N/A 02/19/2014    Procedure: APPENDECTOMY LAPAROSCOPIC;  Surgeon: Erroll Luna, MD;  Location: [facility] OR;  Service: General;  Laterality: N/A;   Family History  Problem Relation Age of Onset  . Prostate cancer Father   . Breast cancer Mother   . Heart disease Mother   . Uterine cancer Mother   . Colon cancer Neg Hx    Social History  Substance Use Topics  . Smoking  status: Former Smoker -- 1.00 packs/day for 32 years    Types: Cigarettes    Quit date: 02/19/1984  . Smokeless tobacco: Never Used  . Alcohol Use: 0.0 oz/week    0 Standard drinks or equivalent per week     Comment: 1 drink daily    OB History    No data available     Review of Systems  Constitutional: Negative for fever and chills.  Gastrointestinal: Negative for abdominal pain.  Genitourinary: Positive for pelvic pain.  Neurological: Negative for headaches.  All other systems reviewed and are negative.     Allergies  Wasp venom; Biaxin; and Brimonidine  Home Medications   Prior to Admission medications   Medication Sig Start Date End Date Taking? Authorizing Provider  albuterol (PROVENTIL HFA;VENTOLIN HFA) 108 (90 Base) MCG/ACT inhaler Inhale 2 puffs into the lungs 2 (two) times daily as needed for wheezing or shortness of breath.   Yes Historical Provider, MD  aspirin EC 81 MG tablet Take 81 mg by mouth daily.   Yes Historical Provider, MD  b complex vitamins tablet Take 1 tablet by mouth daily.   Yes Historical Provider, MD  Calcium Carbonate (CALCIUM 600 PO) Take 1 tablet by mouth 3 (three) times daily.   Yes Historical Provider, MD  Cholecalciferol (VITAMIN D) 2000 units tablet Take 2,000 Units by mouth daily.   Yes Historical Provider, MD  Dorzolamide HCl-Timolol Mal PF 22.3-6.8 MG/ML SOLN Place 1 drop into both eyes 2 (two) times daily.    Yes Historical Provider, MD  EPINEPHrine 0.3 mg/0.3 mL IJ SOAJ injection Inject 0.3 mg into the muscle as needed (for allergic reaction).   Yes Historical Provider, MD  Flaxseed, Linseed, (FLAXSEED OIL PO) Take 1 tablet by mouth daily.   Yes Historical Provider, MD  fluticasone (FLONASE) 50 MCG/ACT nasal spray Place 1 spray into both nostrils daily.    Yes Historical Provider, MD  fluticasone (FLOVENT DISKUS) 50 MCG/BLIST diskus inhaler Inhale 1 puff into the lungs 2 (two) times daily. 04/06/15  Yes Brand Males, MD   HYDROcodone-acetaminophen (NORCO/VICODIN) 5-325 MG tablet Take 1 tablet by mouth every 8 (eight) hours as needed for moderate pain.   Yes Historical Provider, MD  irbesartan (AVAPRO) 150 MG tablet Take 150 mg by mouth daily.   Yes Historical Provider, MD  LUMIGAN 0.01 % SOLN Place 1 drop into both eyes at bedtime. 03/04/15  Yes Historical Provider, MD  Multiple Vitamins-Minerals (VISION FORMULA/LUTEIN) TABS Take 1 tablet by mouth daily.   Yes Historical Provider, MD  omeprazole (PRILOSEC) 20 MG capsule Take 20 mg by mouth daily.   Yes Historical Provider, MD  vitamin C (ASCORBIC ACID) 500 MG tablet Take 500 mg by mouth daily.   Yes Historical Provider, MD  vitamin E 400 UNIT capsule Take 400 Units by mouth daily.     Yes Historical Provider, MD  oxyCODONE-acetaminophen (PERCOCET/ROXICET) 5-325 MG tablet Take 1 tablet by mouth every 4 (four) hours as needed for severe pain. 05/08/15   Earnestine Tuohey, MD   BP 141/58 mmHg  Pulse 63  Resp 16  SpO2 98% Physical Exam  Constitutional: She is oriented to person, place, and time. She appears well-developed and well-nourished.  HENT:  Head: Normocephalic and atraumatic.  Neck: Normal range of motion.  Cardiovascular: Normal rate and regular rhythm.  Exam reveals no friction rub.   No murmur heard. Pulmonary/Chest: Effort normal. No stridor. No respiratory distress. She has no wheezes.  Abdominal: Soft. She exhibits no distension. There is no tenderness.  Genitourinary: Guaiac negative stool. No vaginal discharge found.  Musculoskeletal: Normal range of motion. She exhibits tenderness (anterior palpation of left side of pelvis).  Neurological: She is alert and oriented to person, place, and time. No cranial nerve deficit.  Skin: Skin is warm and dry.  Nursing note and vitals reviewed.   ED Course  Procedures (including critical care time) Labs Review Labs Reviewed  CBC WITH DIFFERENTIAL/PLATELET - Abnormal; Notable for the following:    WBC 11.3  (*)    Neutro Abs 8.1 (*)    All other components within normal limits  COMPREHENSIVE METABOLIC PANEL - Abnormal; Notable for the following:    Glucose, Bld 101 (*)    BUN 24 (*)    Calcium 8.8 (*)    ALT 58 (*)    All other components within normal limits    Imaging Review Dg Knee 2 Views Left  05/08/2015  CLINICAL DATA:  [place] at home, possible fracture, history of knee replacement EXAM: LEFT KNEE - 1-2 VIEW COMPARISON:  None. FINDINGS: Three views of the left knee submitted. There is left knee prosthesis with anatomic alignment. No evidence of prosthesis loosening. No acute fracture or subluxation. Atherosclerotic calcifications of femoral artery. IMPRESSION: No acute fracture or subluxation. Left knee prosthesis with anatomic alignment. Electronically Signed   By: Lahoma Crocker [name]   On: 05/08/2015 13:03  Ct Pelvis Wo Contrast  05/08/2015  CLINICAL DATA:  Fall, left pelvic/hip pain EXAM: CT PELVIS WITHOUT CONTRAST TECHNIQUE: Multidetector CT imaging of the pelvis was performed following the standard protocol without intravenous contrast. COMPARISON:  Left hip radiographs dated 05/08/2015 FINDINGS: Nondisplaced fracture involving the left superior pubic ramus (coronal images 81 and 84). Additional mildly comminuted, nondisplaced fracture involving the left inferior pubic ramus (series 4/ image 78). No definite fracture involving the left proximal femur. Old/healed fractures involving the right superior pubic ramus (series 4/ image 62) and inferior pubic ramus (series 4/ image 37). Degenerative changes at L4-5 and L5-S1. Uterus is unremarkable.  No adnexal masses. Bladder is within normal limits. No pelvic ascites. No suspicious pelvic lymphadenopathy. Sigmoid diverticulosis, without evidence of diverticulitis. Prior appendectomy. Moderate fat containing periumbilical hernia. IMPRESSION: Nondisplaced fracture involving the left superior pubic ramus. Mildly comminuted, nondisplaced fracture involving  the left inferior pubic ramus. No definite fracture involving the left proximal femur. Electronically Signed   By: Julian Hy [name]   On: 05/08/2015 15:13   Dg Hip Unilat With Pelvis 2-3 Views Left  05/08/2015  CLINICAL DATA:  [place] at home, left hip pain EXAM: DG HIP (WITH OR WITHOUT PELVIS) 2-3V LEFT COMPARISON:  None. FINDINGS: While there is evidence of chronic deformity related to prior fracture involving the inferior pubic ramus on the right, the inferior pubic ramus on the left shows a band of lucency that appears most consistent with acute fracture. Additionally, there appears to be a nondisplaced acute fracture of the superior pubic ramus on the left. There is very subtle questionable cortical irregularity in the femoral neck along its medial aspect. The femur is likely intact although a nondisplaced very subtle femoral neck fracture is not excluded. There is femoral artery calcification. IMPRESSION: 1. Acute nondisplaced fractures of the superior and inferior pubic rami on the left 2. Cannot exclude very subtle nondisplaced left femoral neck fracture Electronically Signed   By: Skipper Cliche [name]   On: 05/08/2015 13:15   I have personally reviewed and evaluated these images and lab results as part of my medical decision-making.   EKG Interpretation None      MDM   Final diagnoses:  Fracture of multiple pubic rami, left, closed, initial encounter [facility])    18-year-old female with a mechanical fall yesterday and subsequently having left superior and inferior. Grandma fractures. CT scan done to rule out femur fracture and this was negative. Discussed the patient admission for physical therapy, pain control and further evaluation however she says she's had pelvic fractures in the past and has all the necessary equipment at home and she just prefers physical therapy come to the house and help her up. A moderate course of pain medication was prescribed, care management was consult it and  physical therapy will come to the house to help the patient.  I have personally and contemperaneously reviewed labs and imaging and used in my decision making as above.   A medical screening exam was performed and I feel the patient has had an appropriate workup for their chief complaint at this time and likelihood of emergent condition existing is low. Their vital signs are stable. They have been counseled on decision, discharge, follow up and which symptoms necessitate immediate return to the emergency department.  They verbally stated understanding and agreement with plan and discharged in stable condition.      Merrily Pew, MD 05/09/15 1455

## 2015-05-11 ENCOUNTER — Telehealth: Payer: Self-pay | Admitting: Internal Medicine

## 2015-05-11 MED ORDER — FLUTICASONE PROPIONATE (INHAL) 50 MCG/BLIST IN AEPB: 1.0000 | INHALATION_SPRAY | Freq: Two times a day (BID) | RESPIRATORY_TRACT | Status: DC

## 2015-05-11 NOTE — Telephone Encounter (Signed)
Called spoke with pt. Aware flovent sent to [facility]. Nothing further needed

## 2015-05-12 ENCOUNTER — Ambulatory Visit: Payer: PPO | Admitting: Internal Medicine

## 2015-05-13 DIAGNOSIS — M1990 Unspecified osteoarthritis, unspecified site: Secondary | ICD-10-CM | POA: Diagnosis not present

## 2015-05-13 DIAGNOSIS — Z9181 History of falling: Secondary | ICD-10-CM | POA: Diagnosis not present

## 2015-05-13 DIAGNOSIS — Z7982 Long term (current) use of aspirin: Secondary | ICD-10-CM | POA: Diagnosis not present

## 2015-05-13 DIAGNOSIS — K219 Gastro-esophageal reflux disease without esophagitis: Secondary | ICD-10-CM | POA: Diagnosis not present

## 2015-05-13 DIAGNOSIS — I10 Essential (primary) hypertension: Secondary | ICD-10-CM | POA: Diagnosis not present

## 2015-05-13 DIAGNOSIS — E669 Obesity, unspecified: Secondary | ICD-10-CM | POA: Diagnosis not present

## 2015-05-13 DIAGNOSIS — E559 Vitamin D deficiency, unspecified: Secondary | ICD-10-CM | POA: Diagnosis not present

## 2015-05-13 DIAGNOSIS — S32501D Unspecified fracture of right pubis, subsequent encounter for fracture with routine healing: Secondary | ICD-10-CM | POA: Diagnosis not present

## 2015-05-13 DIAGNOSIS — K5090 Crohn's disease, unspecified, without complications: Secondary | ICD-10-CM | POA: Diagnosis not present

## 2015-05-15 DIAGNOSIS — M1990 Unspecified osteoarthritis, unspecified site: Secondary | ICD-10-CM | POA: Diagnosis not present

## 2015-05-15 DIAGNOSIS — K219 Gastro-esophageal reflux disease without esophagitis: Secondary | ICD-10-CM | POA: Diagnosis not present

## 2015-05-15 DIAGNOSIS — Z7982 Long term (current) use of aspirin: Secondary | ICD-10-CM | POA: Diagnosis not present

## 2015-05-15 DIAGNOSIS — Z9181 History of falling: Secondary | ICD-10-CM | POA: Diagnosis not present

## 2015-05-15 DIAGNOSIS — I10 Essential (primary) hypertension: Secondary | ICD-10-CM | POA: Diagnosis not present

## 2015-05-15 DIAGNOSIS — E669 Obesity, unspecified: Secondary | ICD-10-CM | POA: Diagnosis not present

## 2015-05-15 DIAGNOSIS — K5090 Crohn's disease, unspecified, without complications: Secondary | ICD-10-CM | POA: Diagnosis not present

## 2015-05-15 DIAGNOSIS — E559 Vitamin D deficiency, unspecified: Secondary | ICD-10-CM | POA: Diagnosis not present

## 2015-05-15 DIAGNOSIS — S32501D Unspecified fracture of right pubis, subsequent encounter for fracture with routine healing: Secondary | ICD-10-CM | POA: Diagnosis not present

## 2015-05-18 DIAGNOSIS — E559 Vitamin D deficiency, unspecified: Secondary | ICD-10-CM | POA: Diagnosis not present

## 2015-05-18 DIAGNOSIS — Z9181 History of falling: Secondary | ICD-10-CM | POA: Diagnosis not present

## 2015-05-18 DIAGNOSIS — I10 Essential (primary) hypertension: Secondary | ICD-10-CM | POA: Diagnosis not present

## 2015-05-18 DIAGNOSIS — Z7982 Long term (current) use of aspirin: Secondary | ICD-10-CM | POA: Diagnosis not present

## 2015-05-18 DIAGNOSIS — S32501D Unspecified fracture of right pubis, subsequent encounter for fracture with routine healing: Secondary | ICD-10-CM | POA: Diagnosis not present

## 2015-05-18 DIAGNOSIS — E669 Obesity, unspecified: Secondary | ICD-10-CM | POA: Diagnosis not present

## 2015-05-18 DIAGNOSIS — M1990 Unspecified osteoarthritis, unspecified site: Secondary | ICD-10-CM | POA: Diagnosis not present

## 2015-05-18 DIAGNOSIS — K219 Gastro-esophageal reflux disease without esophagitis: Secondary | ICD-10-CM | POA: Diagnosis not present

## 2015-05-18 DIAGNOSIS — K5090 Crohn's disease, unspecified, without complications: Secondary | ICD-10-CM | POA: Diagnosis not present

## 2015-05-21 DIAGNOSIS — E559 Vitamin D deficiency, unspecified: Secondary | ICD-10-CM | POA: Diagnosis not present

## 2015-05-21 DIAGNOSIS — S32501D Unspecified fracture of right pubis, subsequent encounter for fracture with routine healing: Secondary | ICD-10-CM | POA: Diagnosis not present

## 2015-05-21 DIAGNOSIS — E669 Obesity, unspecified: Secondary | ICD-10-CM | POA: Diagnosis not present

## 2015-05-21 DIAGNOSIS — K219 Gastro-esophageal reflux disease without esophagitis: Secondary | ICD-10-CM | POA: Diagnosis not present

## 2015-05-21 DIAGNOSIS — K5090 Crohn's disease, unspecified, without complications: Secondary | ICD-10-CM | POA: Diagnosis not present

## 2015-05-21 DIAGNOSIS — M1990 Unspecified osteoarthritis, unspecified site: Secondary | ICD-10-CM | POA: Diagnosis not present

## 2015-05-21 DIAGNOSIS — Z7982 Long term (current) use of aspirin: Secondary | ICD-10-CM | POA: Diagnosis not present

## 2015-05-21 DIAGNOSIS — Z9181 History of falling: Secondary | ICD-10-CM | POA: Diagnosis not present

## 2015-05-21 DIAGNOSIS — I10 Essential (primary) hypertension: Secondary | ICD-10-CM | POA: Diagnosis not present

## 2015-05-25 DIAGNOSIS — E559 Vitamin D deficiency, unspecified: Secondary | ICD-10-CM | POA: Diagnosis not present

## 2015-05-25 DIAGNOSIS — K5090 Crohn's disease, unspecified, without complications: Secondary | ICD-10-CM | POA: Diagnosis not present

## 2015-05-25 DIAGNOSIS — Z9181 History of falling: Secondary | ICD-10-CM | POA: Diagnosis not present

## 2015-05-25 DIAGNOSIS — E669 Obesity, unspecified: Secondary | ICD-10-CM | POA: Diagnosis not present

## 2015-05-25 DIAGNOSIS — K219 Gastro-esophageal reflux disease without esophagitis: Secondary | ICD-10-CM | POA: Diagnosis not present

## 2015-05-25 DIAGNOSIS — M1990 Unspecified osteoarthritis, unspecified site: Secondary | ICD-10-CM | POA: Diagnosis not present

## 2015-05-25 DIAGNOSIS — S32501D Unspecified fracture of right pubis, subsequent encounter for fracture with routine healing: Secondary | ICD-10-CM | POA: Diagnosis not present

## 2015-05-25 DIAGNOSIS — Z7982 Long term (current) use of aspirin: Secondary | ICD-10-CM | POA: Diagnosis not present

## 2015-05-25 DIAGNOSIS — I10 Essential (primary) hypertension: Secondary | ICD-10-CM | POA: Diagnosis not present

## 2015-05-29 DIAGNOSIS — K219 Gastro-esophageal reflux disease without esophagitis: Secondary | ICD-10-CM | POA: Diagnosis not present

## 2015-05-29 DIAGNOSIS — E559 Vitamin D deficiency, unspecified: Secondary | ICD-10-CM | POA: Diagnosis not present

## 2015-05-29 DIAGNOSIS — M1990 Unspecified osteoarthritis, unspecified site: Secondary | ICD-10-CM | POA: Diagnosis not present

## 2015-05-29 DIAGNOSIS — Z9181 History of falling: Secondary | ICD-10-CM | POA: Diagnosis not present

## 2015-05-29 DIAGNOSIS — S32501D Unspecified fracture of right pubis, subsequent encounter for fracture with routine healing: Secondary | ICD-10-CM | POA: Diagnosis not present

## 2015-05-29 DIAGNOSIS — E669 Obesity, unspecified: Secondary | ICD-10-CM | POA: Diagnosis not present

## 2015-05-29 DIAGNOSIS — Z7982 Long term (current) use of aspirin: Secondary | ICD-10-CM | POA: Diagnosis not present

## 2015-05-29 DIAGNOSIS — K5090 Crohn's disease, unspecified, without complications: Secondary | ICD-10-CM | POA: Diagnosis not present

## 2015-05-29 DIAGNOSIS — I10 Essential (primary) hypertension: Secondary | ICD-10-CM | POA: Diagnosis not present

## 2015-06-02 DIAGNOSIS — M2550 Pain in unspecified joint: Secondary | ICD-10-CM | POA: Diagnosis not present

## 2015-06-02 DIAGNOSIS — I10 Essential (primary) hypertension: Secondary | ICD-10-CM | POA: Diagnosis not present

## 2015-06-02 DIAGNOSIS — Z6837 Body mass index (BMI) 37.0-37.9, adult: Secondary | ICD-10-CM | POA: Diagnosis not present

## 2015-06-02 DIAGNOSIS — E119 Type 2 diabetes mellitus without complications: Secondary | ICD-10-CM | POA: Diagnosis not present

## 2015-06-02 DIAGNOSIS — S329XXA Fracture of unspecified parts of lumbosacral spine and pelvis, initial encounter for closed fracture: Secondary | ICD-10-CM | POA: Diagnosis not present

## 2015-06-05 DIAGNOSIS — S32501D Unspecified fracture of right pubis, subsequent encounter for fracture with routine healing: Secondary | ICD-10-CM | POA: Diagnosis not present

## 2015-06-05 DIAGNOSIS — E669 Obesity, unspecified: Secondary | ICD-10-CM | POA: Diagnosis not present

## 2015-06-05 DIAGNOSIS — Z9181 History of falling: Secondary | ICD-10-CM | POA: Diagnosis not present

## 2015-06-05 DIAGNOSIS — K219 Gastro-esophageal reflux disease without esophagitis: Secondary | ICD-10-CM | POA: Diagnosis not present

## 2015-06-05 DIAGNOSIS — K5090 Crohn's disease, unspecified, without complications: Secondary | ICD-10-CM | POA: Diagnosis not present

## 2015-06-05 DIAGNOSIS — M1990 Unspecified osteoarthritis, unspecified site: Secondary | ICD-10-CM | POA: Diagnosis not present

## 2015-06-05 DIAGNOSIS — E559 Vitamin D deficiency, unspecified: Secondary | ICD-10-CM | POA: Diagnosis not present

## 2015-06-05 DIAGNOSIS — Z7982 Long term (current) use of aspirin: Secondary | ICD-10-CM | POA: Diagnosis not present

## 2015-06-05 DIAGNOSIS — I10 Essential (primary) hypertension: Secondary | ICD-10-CM | POA: Diagnosis not present

## 2015-06-07 ENCOUNTER — Encounter (HOSPITAL_COMMUNITY): Payer: Self-pay

## 2015-06-07 ENCOUNTER — Emergency Department (HOSPITAL_COMMUNITY)
Admission: EM | Admit: 2015-06-07 | Discharge: 2015-06-08 | Disposition: A | Discharge status: Home / Self Care | Payer: PPO | Attending: Emergency Medicine | Admitting: Emergency Medicine

## 2015-06-07 DIAGNOSIS — K5730 Diverticulosis of large intestine without perforation or abscess without bleeding: Secondary | ICD-10-CM | POA: Diagnosis not present

## 2015-06-07 DIAGNOSIS — Z79899 Other long term (current) drug therapy: Secondary | ICD-10-CM | POA: Diagnosis not present

## 2015-06-07 DIAGNOSIS — I7100 Dissection of unspecified site of aorta: Secondary | ICD-10-CM

## 2015-06-07 DIAGNOSIS — J45991 Cough variant asthma: Secondary | ICD-10-CM | POA: Insufficient documentation

## 2015-06-07 DIAGNOSIS — M545 Low back pain: Secondary | ICD-10-CM | POA: Insufficient documentation

## 2015-06-07 DIAGNOSIS — M810 Age-related osteoporosis without current pathological fracture: Secondary | ICD-10-CM | POA: Insufficient documentation

## 2015-06-07 DIAGNOSIS — Z87891 Personal history of nicotine dependence: Secondary | ICD-10-CM | POA: Diagnosis not present

## 2015-06-07 DIAGNOSIS — M549 Dorsalgia, unspecified: Secondary | ICD-10-CM

## 2015-06-07 DIAGNOSIS — Z7982 Long term (current) use of aspirin: Secondary | ICD-10-CM | POA: Diagnosis not present

## 2015-06-07 DIAGNOSIS — Z8781 Personal history of (healed) traumatic fracture: Secondary | ICD-10-CM | POA: Insufficient documentation

## 2015-06-07 DIAGNOSIS — Z7951 Long term (current) use of inhaled steroids: Secondary | ICD-10-CM | POA: Diagnosis not present

## 2015-06-07 DIAGNOSIS — E559 Vitamin D deficiency, unspecified: Secondary | ICD-10-CM | POA: Diagnosis not present

## 2015-06-07 DIAGNOSIS — H409 Unspecified glaucoma: Secondary | ICD-10-CM | POA: Insufficient documentation

## 2015-06-07 DIAGNOSIS — K219 Gastro-esophageal reflux disease without esophagitis: Secondary | ICD-10-CM | POA: Insufficient documentation

## 2015-06-07 DIAGNOSIS — I10 Essential (primary) hypertension: Secondary | ICD-10-CM | POA: Insufficient documentation

## 2015-06-07 DIAGNOSIS — E669 Obesity, unspecified: Secondary | ICD-10-CM | POA: Insufficient documentation

## 2015-06-07 DIAGNOSIS — M1990 Unspecified osteoarthritis, unspecified site: Secondary | ICD-10-CM | POA: Insufficient documentation

## 2015-06-07 NOTE — ED Notes (Signed)
Pt arrived via [facility] from home c/o low back pain since Thursday.  Recent hip surgery.  Hx high blood pressure, pt stated she took 2 of her home blood pressure medications tonight.

## 2015-06-08 ENCOUNTER — Emergency Department (HOSPITAL_COMMUNITY): Payer: PPO

## 2015-06-08 ENCOUNTER — Encounter (HOSPITAL_COMMUNITY): Payer: Self-pay | Admitting: Radiology

## 2015-06-08 DIAGNOSIS — M549 Dorsalgia, unspecified: Secondary | ICD-10-CM | POA: Diagnosis not present

## 2015-06-08 DIAGNOSIS — K5730 Diverticulosis of large intestine without perforation or abscess without bleeding: Secondary | ICD-10-CM | POA: Diagnosis not present

## 2015-06-08 LAB — URINALYSIS, ROUTINE W REFLEX MICROSCOPIC
BILIRUBIN URINE: NEGATIVE
Glucose, UA: NEGATIVE mg/dL
HGB URINE DIPSTICK: NEGATIVE
Ketones, ur: NEGATIVE mg/dL
Leukocytes, UA: NEGATIVE
Nitrite: NEGATIVE
PH: 6 (ref 5.0–8.0)
Protein, ur: NEGATIVE mg/dL
SPECIFIC GRAVITY, URINE: 1.015 (ref 1.005–1.030)

## 2015-06-08 LAB — CBC WITH DIFFERENTIAL/PLATELET
Basophils Absolute: 0 10*3/uL (ref 0.0–0.1)
Basophils Relative: 0 %
EOS PCT: 3 %
Eosinophils Absolute: 0.3 10*3/uL (ref 0.0–0.7)
HEMATOCRIT: 42.3 % (ref 36.0–46.0)
HEMOGLOBIN: 13.8 g/dL (ref 12.0–15.0)
LYMPHS ABS: 1.5 10*3/uL (ref 0.7–4.0)
LYMPHS PCT: 15 %
MCH: 28.6 pg (ref 26.0–34.0)
MCHC: 32.6 g/dL (ref 30.0–36.0)
MCV: 87.8 fL (ref 78.0–100.0)
Monocytes Absolute: 0.7 10*3/uL (ref 0.1–1.0)
Monocytes Relative: 7 %
NEUTROS ABS: 7.7 10*3/uL (ref 1.7–7.7)
Neutrophils Relative %: 75 %
PLATELETS: 303 10*3/uL (ref 150–400)
RBC: 4.82 MIL/uL (ref 3.87–5.11)
RDW: 13.4 % (ref 11.5–15.5)
WBC: 10.2 10*3/uL (ref 4.0–10.5)

## 2015-06-08 LAB — SAMPLE TO BLOOD BANK

## 2015-06-08 LAB — COMPREHENSIVE METABOLIC PANEL
ALBUMIN: 3.6 g/dL (ref 3.5–5.0)
ALK PHOS: 94 U/L (ref 38–126)
ALT: 32 U/L (ref 14–54)
AST: 30 U/L (ref 15–41)
Anion gap: 13 (ref 5–15)
BILIRUBIN TOTAL: 1.2 mg/dL (ref 0.3–1.2)
BUN: 13 mg/dL (ref 6–20)
CALCIUM: 9.2 mg/dL (ref 8.9–10.3)
CO2: 22 mmol/L (ref 22–32)
CREATININE: 0.68 mg/dL (ref 0.44–1.00)
Chloride: 106 mmol/L (ref 101–111)
GFR calc Af Amer: >60 mL/min (ref >60)
GFR calc non Af Amer: >60 mL/min (ref >60)
GLUCOSE: 120 mg/dL — AB (ref 65–99)
Potassium: 3.8 mmol/L (ref 3.5–5.1)
SODIUM: 141 mmol/L (ref 135–145)
TOTAL PROTEIN: 6.6 g/dL (ref 6.5–8.1)

## 2015-06-08 LAB — I-STAT TROPONIN, ED: Troponin i, poc: 0 ng/mL (ref 0.00–0.08)

## 2015-06-08 MED ORDER — FENTANYL CITRATE (PF) 100 MCG/2ML IJ SOLN
50.0000 ug | Freq: Once | INTRAMUSCULAR | Status: AC
  Administered 2015-06-08: 50 ug via INTRAVENOUS
  Filled 2015-06-08: qty 2

## 2015-06-08 MED ORDER — DOCUSATE SODIUM 100 MG PO CAPS: 100.0000 mg | ORAL_CAPSULE | Freq: Two times a day (BID) | ORAL | Status: DC

## 2015-06-08 MED ORDER — OXYCODONE-ACETAMINOPHEN 5-325 MG PO TABS: 1.0000 | ORAL_TABLET | Freq: Four times a day (QID) | ORAL | Status: DC | PRN

## 2015-06-08 MED ORDER — ONDANSETRON HCL 4 MG/2ML IJ SOLN
4.0000 mg | Freq: Once | INTRAMUSCULAR | Status: AC
  Administered 2015-06-08: 4 mg via INTRAVENOUS
  Filled 2015-06-08: qty 2

## 2015-06-08 MED ORDER — SODIUM CHLORIDE 0.9 % IV BOLUS (SEPSIS)
1000.0000 mL | Freq: Once | INTRAVENOUS | Status: AC
  Administered 2015-06-08: 1000 mL via INTRAVENOUS

## 2015-06-08 MED ORDER — MORPHINE SULFATE (PF) 4 MG/ML IV SOLN
4.0000 mg | Freq: Once | INTRAVENOUS | Status: AC
  Administered 2015-06-08: 4 mg via INTRAVENOUS
  Filled 2015-06-08: qty 1

## 2015-06-08 MED ORDER — HYDROMORPHONE HCL 1 MG/ML IJ SOLN: 0.5000 mg | Freq: Once | INTRAMUSCULAR | Status: DC

## 2015-06-08 MED ORDER — IOPAMIDOL (ISOVUE-370) INJECTION 76%
INTRAVENOUS | Status: AC
  Filled 2015-06-08: qty 100

## 2015-06-08 NOTE — ED Provider Notes (Signed)
By signing my name below, I, Ashley Parker, attest that this documentation has been prepared under the direction and in the presence of [name], DO Electronically Signed: Michiel Parker, ED Scribe. 06/08/2015. 2:22 AM.  TIME SEEN: 1:11 AM    CHIEF COMPLAINT: Chief Complaint  Patient presents with  . Back Pain     HPI: HPI Comments: Ashley Parker is a 82 y.o. female with history of hypertension, osteoporosis who presents to the Emergency Department complaining of intermittent back pain which began four days ago. States she recently had a nonsurgical pelvic fracture. Pt states her physical therapist came to work with her several days ago; during that session she was asked to roll her pelvis which provided mild temporary relief,  after the therapist left her pain returned when ambulating. Pt notes associated pain in her left arm and left calf.  She notes her pain is improved with lying down.  She has taken flexeril without relief as well ast ibuprofen and oxycodone with temporary relief. Pt denies chest discomfort or CP, SOB, numbness/tingling, or focal weakness. No bowel or bladder incontinence. No history of back problems, back surgeries, epidural injections.  No other injury. She is not on anticoagulation. She states that she did note that her blood pressure was in the 220s/127. States she took an extra dose of her blood pressure medication. Reports her blood pressure is normally in the 130s to 140/70s to 80s. States she has never been told that she has a comments with her aorta.  She is followed by Dr. Dagmar Hait of [facility]  ROS: See HPI Constitutional: no fever  Eyes: no drainage  ENT: no runny nose   Cardiovascular:  no chest pain  Resp: no SOB  GI: no vomiting GU: no dysuria Integumentary: no rash  Allergy: no hives  Musculoskeletal: no leg swelling  Neurological: no slurred speech ROS otherwise negative  PAST MEDICAL HISTORY/PAST SURGICAL HISTORY:  Past  Medical History  Diagnosis Date  . Osteoporosis   . Hypertension   . Obesity, unspecified   . Unspecified vitamin D deficiency   . Glaucoma   . Arthritis   . Macular degeneration   . Wrist fracture, left 1943, 1985  . Pelvic fracture ([place]) 2007  . Hemorrhoids     Internal and External  . Stricture of esophagus     distal  . Fundic gland polyposis of stomach   . GERD (gastroesophageal reflux disease)   . Crohn's ileitis ([place])     Mild, mostly asymptomatic, not on therapy  . Cough variant asthma 01/20/2015    MEDICATIONS:  Prior to Admission medications   Medication Sig Start Date End Date Taking? Authorizing Provider  albuterol (PROVENTIL HFA;VENTOLIN HFA) 108 (90 Base) MCG/ACT inhaler Inhale 2 puffs into the lungs 2 (two) times daily as needed for wheezing or shortness of breath.    Historical Provider, MD  aspirin EC 81 MG tablet Take 81 mg by mouth daily.    Historical Provider, MD  b complex vitamins tablet Take 1 tablet by mouth daily.    Historical Provider, MD  Calcium Carbonate (CALCIUM 600 PO) Take 1 tablet by mouth 3 (three) times daily.    Historical Provider, MD  Cholecalciferol (VITAMIN D) 2000 units tablet Take 2,000 Units by mouth daily.    Historical Provider, MD  Dorzolamide HCl-Timolol Mal PF 22.3-6.8 MG/ML SOLN Place 1 drop into both eyes 2 (two) times daily.     Historical Provider, MD  EPINEPHrine 0.3 mg/0.3  mL IJ SOAJ injection Inject 0.3 mg into the muscle as needed (for allergic reaction).    Historical Provider, MD  Flaxseed, Linseed, (FLAXSEED OIL PO) Take 1 tablet by mouth daily.    Historical Provider, MD  fluticasone (FLONASE) 50 MCG/ACT nasal spray Place 1 spray into both nostrils daily.     Historical Provider, MD  fluticasone (FLOVENT DISKUS) 50 MCG/BLIST diskus inhaler Inhale 1 puff into the lungs 2 (two) times daily. 05/11/15   Brand Males, MD  HYDROcodone-acetaminophen (NORCO/VICODIN) 5-325 MG tablet Take 1 tablet by mouth every 8 (eight) hours  as needed for moderate pain.    Historical Provider, MD  irbesartan (AVAPRO) 150 MG tablet Take 150 mg by mouth daily.    Historical Provider, MD  LUMIGAN 0.01 % SOLN Place 1 drop into both eyes at bedtime. 03/04/15   Historical Provider, MD  Multiple Vitamins-Minerals (VISION FORMULA/LUTEIN) TABS Take 1 tablet by mouth daily.    Historical Provider, MD  omeprazole (PRILOSEC) 20 MG capsule Take 20 mg by mouth daily.    Historical Provider, MD  oxyCODONE-acetaminophen (PERCOCET/ROXICET) 5-325 MG tablet Take 1 tablet by mouth every 4 (four) hours as needed for severe pain. 05/08/15   Merrily Pew, MD  vitamin C (ASCORBIC ACID) 500 MG tablet Take 500 mg by mouth daily.    Historical Provider, MD  vitamin E 400 UNIT capsule Take 400 Units by mouth daily.      Historical Provider, MD    ALLERGIES:  Allergies  Allergen Reactions  . Wasp Venom Anaphylaxis and Other (See Comments)    Severe pain in lower stomach, sweatiness, and BP drops low   . Biaxin [Clarithromycin] Other (See Comments)    achy   . Brimonidine Other (See Comments)    redness    SOCIAL HISTORY:  Social History  Substance Use Topics  . Smoking status: Former Smoker -- 1.00 packs/day for 32 years    Types: Cigarettes    Quit date: 02/19/1984  . Smokeless tobacco: Never Used  . Alcohol Use: 0.0 oz/week    0 Standard drinks or equivalent per week     Comment: 1 drink daily     FAMILY HISTORY: Family History  Problem Relation Age of Onset  . Prostate cancer Father   . Breast cancer Mother   . Heart disease Mother   . Uterine cancer Mother   . Colon cancer Neg Hx     EXAM: BP 140/54 mmHg  Pulse 73  Temp(Src) 98.1 F (36.7 C) (Oral)  Resp 11  SpO2 95% CONSTITUTIONAL: Alert and oriented and responds appropriately to questions. Well-appearing; well-nourished, Elderly but appears younger than stated age HEAD: Normocephalic EYES: Conjunctivae clear, PERRL ENT: normal nose; no rhinorrhea; moist mucous membranes NECK:  Supple, no meningismus, no LAD  CARD: RRR; S1 and S2 appreciated; no murmurs, no clicks, no rubs, no gallops RESP: Normal chest excursion without splinting or tachypnea; breath sounds clear and equal bilaterally; no wheezes, no rhonchi, no rales, no hypoxia or respiratory distress, speaking full sentences ABD/GI: Normal bowel sounds; non-distended; soft, non-tender, no rebound, no guarding, no peritoneal signs BACK:  The back appears normal and is diffusely tender throughout the lumbar spine and paraspinal musculature without associated lesions, no midline step-off or deformity, there is no CVA tenderness EXT: Normal ROM in all joints; non-tender to palpation; no edema; normal capillary refill; no cyanosis, no calf tenderness or swelling; 2+ DP and radial pulses bilaterally  SKIN: Normal color for age and race; warm; no  rash NEURO: Moves all extremities equally, sensation to light touch intact diffusely, cranial nerves II through XII intact, strength 5/5 in all 4 extremities PSYCH: The patient's mood and manner are appropriate. Grooming and personal hygiene are appropriate.  MEDICAL DECISION MAKING: Patient here with lower back pain. She does have some pain with movement and palpation but given her extremely elevated blood pressure, which again may be in the setting of pain, I am concerned for the possibility of a dissection. She does seem to have equal pulses in all extremities but reports she has some pain that goes down her leg and into her right shoulder. Will obtain labs. EKG shows no ischemic abnormality and is similar compared to prior. We'll obtain a CT of her chest, abdomen and pelvis. We'll give fentanyl and reassess.  ED PROGRESS: Patient's labs are unremarkable. Negative troponin. Urine shows no sign of infection or blood. CT scan shows a normal thoracic and abdominal aorta. She does have nodules in the left and right upper lungs that have been present previously. Have discussed this with  patient and provided her with this information so she can follow up with her PCP for monitoring for this. She also has cholelithiasis but no right upper quadrant tenderness on exam. I doubt this is the cause of her symptoms. She has a hiatal hernia. Diverticulosis without signs of diverticulitis. They again note the subacute fractures of the left hemipelvis and sacrum which are unchanged. Pain has improved after multiple rounds of IV narcotics and she has been able to ambulate. I do feel she is safe to be discharged home. We'll discharge with pain medication. I do not feel she needs further emergent workup. Discussed return precautions. She again has a PCP for follow-up. Suspect that this pain is musculoskeletal in nature. Her blood pressure has also improved with pain control.   At this time, I do not feel there is any life-threatening condition present. I have reviewed and discussed all results (EKG, imaging, lab, urine as appropriate), exam findings with patient. I have reviewed nursing notes and appropriate previous records.  I feel the patient is safe to be discharged home without further emergent workup. Discussed usual and customary return precautions. Patient and family (if present) verbalize understanding and are comfortable with this plan.  Patient will follow-up with their primary care provider. If they do not have a primary care provider, information for follow-up has been provided to them. All questions have been answered.       EKG Interpretation  Date/Time:  Sunday June 07 2015 23:45:19 EDT Ventricular Rate:  70 PR Interval:  202 QRS Duration: 102 QT Interval:  419 QTC Calculation: 452 R Axis:   -56 Text Interpretation:  Sinus rhythm Abnormal R-wave progression, late transition Left ventricular hypertrophy Inferior infarct, old No significant change since last tracing in 2010 Confirmed by Jowel Waltner,  DO, Siona Coulston ([id number]) on 06/08/2015 2:11:57 AM       I personally performed the  services described in this documentation, which was scribed in my presence. The recorded information has been reviewed and is accurate.     [place], DO 06/08/15 [phone]

## 2015-06-08 NOTE — Discharge Instructions (Signed)
You were seen in the emergency department for back pain. I think that this is muscle strain, spasm that can occur from your recent hip fractures. Imaging of your hip show no new injury. You did have pulmonary nodules that were nonspecific and have been seen on previous imaging that will need to be followed by your primary care physician. Your also found to have gallstones which was again another incidental finding but not the cause of your pain. Your labs, urine today were normal. Your blood pressure improved as your pain improved. I feel you're safe to go home. We are discharging you with Percocet that you can take for pain. Please note that this medication may make you drowsy. Please do not drive when you take this medication. Please follow-up with your primary care physician.   Back Pain, Adult Back pain is very common in adults.The cause of back pain is rarely dangerous and the pain often gets better over time.The cause of your back pain may not be known. Some common causes of back pain include:  Strain of the muscles or ligaments supporting the spine.  Wear and tear (degeneration) of the spinal disks.  Arthritis.  Direct injury to the back. For many people, back pain may return. Since back pain is rarely dangerous, most people can learn to manage this condition on their own. HOME CARE INSTRUCTIONS Watch your back pain for any changes. The following actions may help to lessen any discomfort you are feeling:  Remain active. It is stressful on your back to sit or stand in one place for long periods of time. Do not sit, drive, or stand in one place for more than 30 minutes at a time. Take short walks on even surfaces as soon as you are able.Try to increase the length of time you walk each day.  Exercise regularly as directed by your health care provider. Exercise helps your back heal faster. It also helps avoid future injury by keeping your muscles strong and flexible.  Do not stay in  bed.Resting more than 1-2 days can delay your recovery.  Pay attention to your body when you bend and lift. The most comfortable positions are those that put less stress on your recovering back. Always use proper lifting techniques, including:  Bending your knees.  Keeping the load close to your body.  Avoiding twisting.  Find a comfortable position to sleep. Use a firm mattress and lie on your side with your knees slightly bent. If you lie on your back, put a pillow under your knees.  Avoid feeling anxious or stressed.Stress increases muscle tension and can worsen back pain.It is important to recognize when you are anxious or stressed and learn ways to manage it, such as with exercise.  Take medicines only as directed by your health care provider. Over-the-counter medicines to reduce pain and inflammation are often the most helpful.Your health care provider may prescribe muscle relaxant drugs.These medicines help dull your pain so you can more quickly return to your normal activities and healthy exercise.  Apply ice to the injured area:  Put ice in a plastic bag.  Place a towel between your skin and the bag.  Leave the ice on for 20 minutes, 2-3 times a day for the first 2-3 days. After that, ice and heat may be alternated to reduce pain and spasms.  Maintain a healthy weight. Excess weight puts extra stress on your back and makes it difficult to maintain good posture. SEEK MEDICAL CARE IF:  You  have pain that is not relieved with rest or medicine.  You have increasing pain going down into the legs or buttocks.  You have pain that does not improve in one week.  You have night pain.  You lose weight.  You have a fever or chills. SEEK IMMEDIATE MEDICAL CARE IF:   You develop new bowel or bladder control problems.  You have unusual weakness or numbness in your arms or legs.  You develop nausea or vomiting.  You develop abdominal pain.  You feel faint.   This  information is not intended to replace advice given to you by your health care provider. Make sure you discuss any questions you have with your health care provider.   Document Released: 02/14/2005 Document Revised: 03/07/2014 Document Reviewed: 06/18/2013 Elsevier Interactive Patient Education [employer].

## 2015-06-08 NOTE — ED Notes (Signed)
RN attempted IV start for CT Angio x 2; 2nd RN to start IV

## 2015-06-08 NOTE — ED Notes (Signed)
Pt ambulated with walker and stand by assist. Pt reported pain but was able to ambulate freely. EDP aware

## 2015-06-10 DIAGNOSIS — K5090 Crohn's disease, unspecified, without complications: Secondary | ICD-10-CM | POA: Diagnosis not present

## 2015-06-10 DIAGNOSIS — E559 Vitamin D deficiency, unspecified: Secondary | ICD-10-CM | POA: Diagnosis not present

## 2015-06-10 DIAGNOSIS — E669 Obesity, unspecified: Secondary | ICD-10-CM | POA: Diagnosis not present

## 2015-06-10 DIAGNOSIS — K219 Gastro-esophageal reflux disease without esophagitis: Secondary | ICD-10-CM | POA: Diagnosis not present

## 2015-06-10 DIAGNOSIS — Z7982 Long term (current) use of aspirin: Secondary | ICD-10-CM | POA: Diagnosis not present

## 2015-06-10 DIAGNOSIS — I10 Essential (primary) hypertension: Secondary | ICD-10-CM | POA: Diagnosis not present

## 2015-06-10 DIAGNOSIS — S32501D Unspecified fracture of right pubis, subsequent encounter for fracture with routine healing: Secondary | ICD-10-CM | POA: Diagnosis not present

## 2015-06-10 DIAGNOSIS — M1990 Unspecified osteoarthritis, unspecified site: Secondary | ICD-10-CM | POA: Diagnosis not present

## 2015-06-10 DIAGNOSIS — Z9181 History of falling: Secondary | ICD-10-CM | POA: Diagnosis not present

## 2015-06-12 DIAGNOSIS — S32501D Unspecified fracture of right pubis, subsequent encounter for fracture with routine healing: Secondary | ICD-10-CM | POA: Diagnosis not present

## 2015-06-12 DIAGNOSIS — E669 Obesity, unspecified: Secondary | ICD-10-CM | POA: Diagnosis not present

## 2015-06-12 DIAGNOSIS — Z7982 Long term (current) use of aspirin: Secondary | ICD-10-CM | POA: Diagnosis not present

## 2015-06-12 DIAGNOSIS — E559 Vitamin D deficiency, unspecified: Secondary | ICD-10-CM | POA: Diagnosis not present

## 2015-06-12 DIAGNOSIS — I10 Essential (primary) hypertension: Secondary | ICD-10-CM | POA: Diagnosis not present

## 2015-06-12 DIAGNOSIS — K5090 Crohn's disease, unspecified, without complications: Secondary | ICD-10-CM | POA: Diagnosis not present

## 2015-06-12 DIAGNOSIS — K219 Gastro-esophageal reflux disease without esophagitis: Secondary | ICD-10-CM | POA: Diagnosis not present

## 2015-06-12 DIAGNOSIS — Z9181 History of falling: Secondary | ICD-10-CM | POA: Diagnosis not present

## 2015-06-12 DIAGNOSIS — M1990 Unspecified osteoarthritis, unspecified site: Secondary | ICD-10-CM | POA: Diagnosis not present

## 2015-06-17 DIAGNOSIS — S329XXD Fracture of unspecified parts of lumbosacral spine and pelvis, subsequent encounter for fracture with routine healing: Secondary | ICD-10-CM | POA: Diagnosis not present

## 2015-06-17 DIAGNOSIS — Z6837 Body mass index (BMI) 37.0-37.9, adult: Secondary | ICD-10-CM | POA: Diagnosis not present

## 2015-06-18 DIAGNOSIS — M75122 Complete rotator cuff tear or rupture of left shoulder, not specified as traumatic: Secondary | ICD-10-CM | POA: Diagnosis not present

## 2015-06-18 DIAGNOSIS — M545 Low back pain: Secondary | ICD-10-CM | POA: Diagnosis not present

## 2015-06-22 DIAGNOSIS — M545 Low back pain: Secondary | ICD-10-CM | POA: Diagnosis not present

## 2015-06-22 DIAGNOSIS — S32050D Wedge compression fracture of fifth lumbar vertebra, subsequent encounter for fracture with routine healing: Secondary | ICD-10-CM | POA: Diagnosis not present

## 2015-06-22 DIAGNOSIS — R262 Difficulty in walking, not elsewhere classified: Secondary | ICD-10-CM | POA: Diagnosis not present

## 2015-06-22 DIAGNOSIS — S32502D Unspecified fracture of left pubis, subsequent encounter for fracture with routine healing: Secondary | ICD-10-CM | POA: Diagnosis not present

## 2015-06-24 DIAGNOSIS — R262 Difficulty in walking, not elsewhere classified: Secondary | ICD-10-CM | POA: Diagnosis not present

## 2015-06-24 DIAGNOSIS — M545 Low back pain: Secondary | ICD-10-CM | POA: Diagnosis not present

## 2015-06-24 DIAGNOSIS — S32050D Wedge compression fracture of fifth lumbar vertebra, subsequent encounter for fracture with routine healing: Secondary | ICD-10-CM | POA: Diagnosis not present

## 2015-06-24 DIAGNOSIS — S32502D Unspecified fracture of left pubis, subsequent encounter for fracture with routine healing: Secondary | ICD-10-CM | POA: Diagnosis not present

## 2015-06-30 DIAGNOSIS — S32502D Unspecified fracture of left pubis, subsequent encounter for fracture with routine healing: Secondary | ICD-10-CM | POA: Diagnosis not present

## 2015-06-30 DIAGNOSIS — R262 Difficulty in walking, not elsewhere classified: Secondary | ICD-10-CM | POA: Diagnosis not present

## 2015-06-30 DIAGNOSIS — S32050D Wedge compression fracture of fifth lumbar vertebra, subsequent encounter for fracture with routine healing: Secondary | ICD-10-CM | POA: Diagnosis not present

## 2015-06-30 DIAGNOSIS — M545 Low back pain: Secondary | ICD-10-CM | POA: Diagnosis not present

## 2015-07-02 DIAGNOSIS — M545 Low back pain: Secondary | ICD-10-CM | POA: Diagnosis not present

## 2015-07-02 DIAGNOSIS — R262 Difficulty in walking, not elsewhere classified: Secondary | ICD-10-CM | POA: Diagnosis not present

## 2015-07-02 DIAGNOSIS — S32050D Wedge compression fracture of fifth lumbar vertebra, subsequent encounter for fracture with routine healing: Secondary | ICD-10-CM | POA: Diagnosis not present

## 2015-07-02 DIAGNOSIS — S32502D Unspecified fracture of left pubis, subsequent encounter for fracture with routine healing: Secondary | ICD-10-CM | POA: Diagnosis not present

## 2015-07-07 DIAGNOSIS — M5441 Lumbago with sciatica, right side: Secondary | ICD-10-CM | POA: Diagnosis not present

## 2015-07-16 DIAGNOSIS — M545 Low back pain: Secondary | ICD-10-CM | POA: Diagnosis not present

## 2015-07-20 DIAGNOSIS — M5442 Lumbago with sciatica, left side: Secondary | ICD-10-CM | POA: Diagnosis not present

## 2015-07-20 DIAGNOSIS — M5441 Lumbago with sciatica, right side: Secondary | ICD-10-CM | POA: Diagnosis not present

## 2015-07-21 DIAGNOSIS — R262 Difficulty in walking, not elsewhere classified: Secondary | ICD-10-CM | POA: Diagnosis not present

## 2015-07-21 DIAGNOSIS — S32502D Unspecified fracture of left pubis, subsequent encounter for fracture with routine healing: Secondary | ICD-10-CM | POA: Diagnosis not present

## 2015-07-21 DIAGNOSIS — S32050D Wedge compression fracture of fifth lumbar vertebra, subsequent encounter for fracture with routine healing: Secondary | ICD-10-CM | POA: Diagnosis not present

## 2015-07-21 DIAGNOSIS — M545 Low back pain: Secondary | ICD-10-CM | POA: Diagnosis not present

## 2015-07-22 DIAGNOSIS — H353130 Nonexudative age-related macular degeneration, bilateral, stage unspecified: Secondary | ICD-10-CM | POA: Diagnosis not present

## 2015-07-22 DIAGNOSIS — H401133 Primary open-angle glaucoma, bilateral, severe stage: Secondary | ICD-10-CM | POA: Diagnosis not present

## 2015-07-22 DIAGNOSIS — H16141 Punctate keratitis, right eye: Secondary | ICD-10-CM | POA: Diagnosis not present

## 2015-07-22 DIAGNOSIS — Z961 Presence of intraocular lens: Secondary | ICD-10-CM | POA: Diagnosis not present

## 2015-07-31 DIAGNOSIS — M79605 Pain in left leg: Secondary | ICD-10-CM | POA: Diagnosis not present

## 2015-07-31 DIAGNOSIS — M79604 Pain in right leg: Secondary | ICD-10-CM | POA: Diagnosis not present

## 2015-08-04 DIAGNOSIS — S32050D Wedge compression fracture of fifth lumbar vertebra, subsequent encounter for fracture with routine healing: Secondary | ICD-10-CM | POA: Diagnosis not present

## 2015-08-04 DIAGNOSIS — S32502D Unspecified fracture of left pubis, subsequent encounter for fracture with routine healing: Secondary | ICD-10-CM | POA: Diagnosis not present

## 2015-08-04 DIAGNOSIS — R262 Difficulty in walking, not elsewhere classified: Secondary | ICD-10-CM | POA: Diagnosis not present

## 2015-08-04 DIAGNOSIS — M545 Low back pain: Secondary | ICD-10-CM | POA: Diagnosis not present

## 2015-08-05 ENCOUNTER — Telehealth: Payer: Self-pay | Admitting: Internal Medicine

## 2015-08-05 MED ORDER — FLUTICASONE PROPIONATE (INHAL) 50 MCG/BLIST IN AEPB: 1.0000 | INHALATION_SPRAY | Freq: Two times a day (BID) | RESPIRATORY_TRACT | Status: DC

## 2015-08-05 NOTE — Telephone Encounter (Signed)
Spoke with pt. She needs a refill on Flovent. This has been sent in. Has upcoming appointment on 08/06/15 with MR. Nothing further was needed.

## 2015-08-06 ENCOUNTER — Encounter: Payer: Self-pay | Admitting: Internal Medicine

## 2015-08-06 ENCOUNTER — Telehealth: Payer: Self-pay | Admitting: Internal Medicine

## 2015-08-06 ENCOUNTER — Ambulatory Visit (INDEPENDENT_AMBULATORY_CARE_PROVIDER_SITE_OTHER): Payer: PPO | Admitting: Internal Medicine

## 2015-08-06 VITALS — BP 136/84 | HR 80 | Ht 66.0 in | Wt 222.6 lb

## 2015-08-06 DIAGNOSIS — R911 Solitary pulmonary nodule: Secondary | ICD-10-CM

## 2015-08-06 DIAGNOSIS — R05 Cough: Secondary | ICD-10-CM

## 2015-08-06 DIAGNOSIS — R053 Chronic cough: Secondary | ICD-10-CM

## 2015-08-06 DIAGNOSIS — J45991 Cough variant asthma: Secondary | ICD-10-CM

## 2015-08-06 MED ORDER — FLUTICASONE FUROATE 100 MCG/ACT IN AEPB: 1.0000 | INHALATION_SPRAY | Freq: Every day | RESPIRATORY_TRACT | Status: DC

## 2015-08-06 NOTE — Telephone Encounter (Signed)
Ashley Parker  Slight change in plan - ct chest wo contrast for nodules is 6 months from mid-April 2017 and not 6 months from 08/06/2015  FU is after CT  Thanks  Dr. Brand Males, [name], [facility].C.P Pulmonary and Critical Care Medicine Staff Physician [place] [facility] Pager: [phone], If no answer or between  15:00h - 7:00h: call [phone]  08/06/2015 5:32 PM

## 2015-08-06 NOTE — Progress Notes (Signed)
Subjective:     Patient ID: Ashley Parker, female   DOB: 04/26/1931, 82 y.o.   MRN: [id number]  HPI    HPI ~  August 08, 2014:  Initial consult by SN>        82 y/o WF, friend of Audrea Muscat Little, and referred by Cecil Cobbs due to refractory AB/ RADS>  She is an ex-smoker but quit over 30 yrs ago, & she denies any hx of signif resp tract illnesses in the past;  She states that she had an upper resp infection in Feb-Mar2016 characterized by head congestion, drainage, cough w/ chest congestion & very thick beige/yellow mucus that would get stuck in her throat & make her choke;  She had assoc SOB/DOE but denied CP or f/c/s;  She was approriately treated by her PCP w/ antibiotics, Prednisone, Prilosec & anti-reflux regimen;  She notes that the Pred seemed to help for awhile but all the symptoms came roaring back & were hard to shake off;  Currently she notes cough, sm amt yellow sput, no hemoptysis, no CP, +SOB & she teaches water aerobics classes 5d/wk![name]..  Current Meds> Qvar80-2spBid, Proair pen (ran out), Tessalon perles as needed...       Ashley Parker is an ex-smoker> starting in her teens and smoked for 30+yrs up to 1ppd; she quit at age 29 & has not smoked in over 30 yrs now;  While she was smoking she denies much in the way of resp symptoms- denies cough, sput, hemoptysis, SOB, etc;  She denies signif resp illnesses in the past- never diagnosed w/ asthma, COPD, pneumonia, TB or known exposure; she has had bronchitis in the past- given antibiotics and once required an Albuterol inhaler which she recalls helped her symptoms;  She has been employed in retail & the office setting w/o known occupational respiratory exposures;  There is no FamHx of lung dis...  EXAM shows Afeb, VSS, O2sat=94%;  HEENT- neg, mallampati2;  Chest- few scat rhonchi at bases, no incr WOB, no signs of consolidation;  Heart- RR w/o m/r/g;  Abd- obese, soft, neg;  Ext- w/o c/c/e  CXR was done recently by DrAvva but we do not have this film or  report to review; only CXR in Epic/PACS was from 2010- norm heart size, mild hyperinflated, clear, NAD...  CT Angio Chest 12/2008 reviewed in Epic/PACS- neg for PE, norm heart size, mild coronary calcif noted, gallstone, mild fx T12, fusion T10-11 (?congen vs degen), NAD in the chest.  FullPFTs done 07/16/14 reviewed in Epic> FVC=2.96 (105%), FEV1=1.89 (90%), %1sec=64, mid-flows reduced at 71% predicted; Lung vols= wnl;  Diffusion is reduced at 63%  LABS 06/2014 by DrAvva> Chems- wnl x GPT=46, A1c=5.8;  CBC- wnl;  TSH=2.19;  VitD=39... 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) and precipitated by her URI 3 months ago; she responded to one of the Prednisone courses that she was prescribed but the symptoms returned after this med was stopped; she has been tried on an ICS and a SABA but she has not had consistent relief;  We discussed these issues and the nature of RADS- I rec that we treat her again w/ an oral corticosteroid (try MEDROL 75m in a slow tapering schedule- see AVS), and add-in an ICS/LABA combination like ADVAIR500- one inhalation Bid; she can still use the PROAIR 1-2 sprays every 4-6H as needed for wheezing & chest tightness, plus MUCINEX 6040mQid w/ fluids for the thick sticky sputum that has  given her so much trouble... We will request her recent CXR report from [place], and I have asked her to call for problems and check back w/ me in about 6wks...  ADDENDUM >> CXR 05/06/14 report received from DrAvva's office- norm heart size, ectatic Ao, clear lungs, compression deformity in lower Tspine (stable, NAD).[name].  ~  September 19, 2014: 1moROV & recheck by SN>       Pt seen last month w/ ?refractory AB/RADS- eval showed clear CXR 04/2014 and mild airflow obstruction on PFT; we treated her w/ anti-inflamm Rx using Medrol taper, Advair500bid, Mucinex600Qid w/ fluids, & ProairHFA as needed;  She returns and indicates that she responded to the meds but says the congestion is  still present; on further questioning it is apparent that Ashley Parker all of her meds when they ran out- didn't maintain on the Advair500Bid, didn't continue on the Mucinex600Qid w/ fluids etc; despite her complaints her chest exam is clear today... EXAM reveals Afeb, VSS, O2sat=95% on RA;  HEENT- neg, mallampati2;  Chest- clear w/o w/r/r;  Heart- RR w/o m/r/g;  Abd- obese, soft, neg;  Ext- w/o c/c/e...  IMP/PLAN>>  We reviewed her diagnosis & the need for on-going meds for her RADS;  Rec to restart ADVAIR500-Bid, MUCINEX 6043mid w/ fluids, and ProairHFA rescue inhaler as needed;  She is encouraged to get on diet, gradually increase exercise, get weight down... Plan ROV in 3-49m48mo check progress.    OV 11/20/2014  Chief Complaint  Patient presents with  . Follow-up    Pt switching from SN to MR. Pt denies SOBm pt states aerobics 4 times per week. Pt c/o prod cough with yellow mucus. Pt denies CP/tightness.    [age]ar old obese lady who is very functional. Transfer of care to Dr RamChase Callere to chronic cough. Cough started after a respiratory infection. The other associated symptoms like shortness of breath but they have resolved. Currently left with residual cough. This a previous history of heavy smoking 30 pack history but quit over 30 years ago. Currently on Advair but not fully helping. RSI cough score and cough details are below; score is 9. She had pulmonary function test in May 2016 that I personally visualized. The shows isolated reduction in diffusion capacity to 63% but otherwise normal. She is frustrated by her cough. Cough is of moderate intensity. It is persistent. No clear cut aggravating or relieving factors. Associated chest x-ray but done in 2010 that I personally visualized has clear lung fields but shows hyperinflation. Inhaler therapy is not working      OV 12/09/2014  Chief Complaint  Patient presents with  . Follow-up    Pt here after HRCT. Pt states she has an  increase in chest congestion since being off the advair. Pt c/o prod cough with yellow mucus. Pt denies CP/tightness.      Follow-up refractory chronic cough. Last visit 11/20/2014. At that time I instructed her to stop fish oil and Advair and do a CT chest and come back for follow-up. She is here for follow-up. She says that since stopping fish oil and Advair she states the cough is somewhat worse with increased mucus production. She thinks this is because she stopped her Advair. She did have CT scan of the chest sept 216 that did not show any interstitial lung disease but did show left upper lobe nodule that is groundglass density at 2.5cm. She had a PET scan today that I personally visualized. The formal report is pending.  To the best of my knowledge and and inability looks like a low uptake on PET scan. She will originally agreed to meet with Dr. Lamonte Sakai to undergo navigational bronchoscopy but today she prefers that she just follow a weight and watch approach.  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 Formal report pending   reports that she quit smoking about 30 years ago. Her smoking use included Cigarettes. She has a 32 pack-year smoking history. She has never used smokeless tobacco.   OV 01/20/2015   Chief Complaint  Patient presents with  . Follow-up    Pt states she completed the abx and pred that was  given on 11/7. Pt states she is improved since completing treatment. Pt states she occasioanlly has a tickle in her throat making her cough. Pt denies SOB and CP/tightness.    Follow-up refractory chronic cough associated with cough variant asthma.  > Last visit 12/09/2014 started Asmanex after seeing an increase in exhaled nitric oxide consistent with eosinophilic airway inflammation.. After this she reported an improvement in  symptoms. Then on 01/05/2015 she called in with an asthma flareup symptoms. Gave her cephalexin and prednisone burst over the phone. This also helped. Currently she is much improved. RSI cough score is improved to 13 as can be seen below. She has no other complaints. She is feeling good. Of note she had oral thrush recently and that is also resolved. Exhaled nitric oxide today is normal at 25  Lung nodule: She has follow-up CT chest January 2017. She has opted for surveillance strategy. She has intermediate probably be lung nodule.    OV 08/06/2015  Chief Complaint  Patient presents with  . 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 Cough is essentially resolved. Only raspy throat. Does rinse after  flovent use. SHe is agreeable to change   Fu lung nodules - INtermediate prob . She declined enb and favors expectant approach. She fell and fractured her hip. Had CT related to that and nodules are unchanged per report   Sept 2016/Oct 2016 Jan 2017 Apr 2017  Left upper lobe 2.5 x 1.8, SUV 2.2. High suspect,  1.8L - no change 1.5 no change  Right upper lobe posterior segment GGO wth SUV 1.6 No chang No change        CT chest 06/08/15 CLINICAL DATA: Intermittent back pain, 4 days duration. - ct visualized  EXAM: CT ANGIOGRAPHY CHEST, ABDOMEN AND PELVIS  TECHNIQUE: Multidetector CT imaging through the chest, abdomen and pelvis was performed using the standard protocol during bolus administration of intravenous contrast. Multiplanar reconstructed images and MIPs were obtained and reviewed to evaluate the vascular anatomy.  CONTRAST: 80 mL Isovue 370 intravenous  COMPARISON: 05/08/2015, 03/11/2015, 12/09/2014, 02/18/2014  FINDINGS: CTA CHEST FINDINGS  The thoracic aorta is normal in caliber and intact. There is no dissection. There are calcifications in the  coronary arteries. Proximal great vessels are patent, standard anatomy.  Review of the MIP images confirms the above findings.  Nonvascular findings in the chest:  Moderate hiatal hernia. No pathologic adenopathy. Unchanged mixed solid and ground-glass opacity nodule in the posterior periphery of the left upper lobe measuring 1.5 cm. Unchanged ground-glass opacity in the posterior segment of the  right upper lobe. No new or enlarging nodules. Mild scarring in the lung bases, unchanged. Airways are patent. No effusions.     Dr Lorenza Cambridge Reflux Symptom Index (> 13-15 suggestive of LPR cough)  11/20/2014  12/09/2014 After stopping advair,-> FENO 50s 01/20/2015  opn asmanex + Rx aeasthma on 01/05/15 via phone. Feno 25  Hoarseness of problem with voice 1 1 0  Clearing  Of Throat 1 3 3   Excess throat mucus or feeling of post nasal drip 1 3 3   Difficulty swallowing food, liquid or tablets 0 2 0  Cough after eating or lying down 0 1 0  Breathing difficulties or choking episodes 0 0 3  Troublesome or annoying cough 1 4 4   Sensation of something sticking in throat or lump in throat 1 2 0  Heartburn, chest pain, indigestion, or stomach acid coming up 1 2 0  TOTAL 9 18 13         has a past medical history of Osteoporosis; Hypertension; Obesity, unspecified; Unspecified vitamin D deficiency; Glaucoma; Arthritis; Macular degeneration; Wrist fracture, left (1943, 1985); Pelvic fracture ([place]) (2007); Hemorrhoids; Stricture of esophagus; Fundic gland polyposis of stomach; GERD (gastroesophageal reflux disease); Crohn's ileitis ([place]); and Cough variant asthma (01/20/2015).   reports that she quit smoking about 31 years ago. Her smoking use included Cigarettes. She has a 32 pack-year smoking history. She has never used smokeless tobacco.  Past Surgical History  Procedure Laterality Date  . Cataract extraction    . Total knee arthroplasty Left 2007  . Colonoscopy  07/27/2001, 07/08/2011     Multiple  . Tonsillectomy and adenoidectomy    . Esophagogastroduodenoscopy      multiple  . Laparoscopic appendectomy N/A 02/19/2014    Procedure: APPENDECTOMY LAPAROSCOPIC;  Surgeon: Erroll Luna, MD;  Location: [facility];  Service: General;  Laterality: N/A;    Allergies  Allergen Reactions  . Wasp Venom Anaphylaxis and Other (See Comments)    Severe pain in lower stomach, sweatiness, and BP drops low   . Biaxin [Clarithromycin] Other (See Comments)    achy   . Brimonidine Other (See Comments)    redness    Immunization History  Administered Date(s) Administered  . Influenza Whole 11/26/2007, 12/28/2009  . Influenza,inj,Quad PF,36+ Mos 12/20/2014  . Influenza-Unspecified 12/07/2013  . Pneumococcal Polysaccharide-23 08/09/2006  . Td 11/26/2007    Family History  Problem Relation Age of Onset  . Prostate cancer Father   . Breast cancer Mother   . Heart disease Mother   . Uterine cancer Mother   . Colon cancer Neg Hx      Current outpatient prescriptions:  .  aspirin EC 81 MG tablet, Take 81 mg by mouth daily., Disp: , Rfl:  .  b complex vitamins tablet, Take 1 tablet by mouth daily., Disp: , Rfl:  .  Calcium Carbonate (CALCIUM 600 PO), Take 1 tablet by mouth 3 (three) times daily., Disp: , Rfl:  .  Cholecalciferol (VITAMIN D) 2000 units tablet, Take 2,000 Units by mouth daily., Disp: , Rfl:  .  docusate sodium (COLACE) 100 MG capsule, Take 1 capsule (100 mg total) by mouth every 12 (twelve) hours., Disp: 60 capsule, Rfl: 0 .  Dorzolamide HCl-Timolol Mal PF 22.3-6.8 MG/ML SOLN, Place 1 drop into both eyes 2 (two) times daily. , Disp: , Rfl:  .  EPINEPHrine 0.3 mg/0.3 mL IJ SOAJ injection, Inject 0.3 mg into the muscle as needed (for allergic reaction)., Disp: , Rfl:  .  Flaxseed, Linseed, (FLAXSEED  OIL PO), Take 1 tablet by mouth daily., Disp: , Rfl:  .  fluticasone (FLONASE) 50 MCG/ACT nasal spray, Place 1 spray into both nostrils daily. , Disp: , Rfl:  .  fluticasone  (FLOVENT DISKUS) 50 MCG/BLIST diskus inhaler, Inhale 1 puff into the lungs 2 (two) times daily., Disp: 3 Inhaler, Rfl: 1 .  gabapentin (NEURONTIN) 300 MG capsule, Take 1-2 tablets 3 times a day, Disp: , Rfl:  .  ibuprofen (ADVIL,MOTRIN) 200 MG tablet, Take 2 tabs 3 times a day, Disp: , Rfl:  .  irbesartan (AVAPRO) 150 MG tablet, Take 150 mg by mouth daily., Disp: , Rfl:  .  LUMIGAN 0.01 % SOLN, Place 1 drop into both eyes at bedtime., Disp: , Rfl: 4 .  methocarbamol (ROBAXIN) 750 MG tablet, Take 750 mg by mouth 3 (three) times daily., Disp: , Rfl:  .  Multiple Vitamins-Minerals (ICAPS AREDS 2 PO), Take 4 capsules by mouth daily., Disp: , Rfl:  .  omeprazole (PRILOSEC) 20 MG capsule, Take 20 mg by mouth daily., Disp: , Rfl:  .  oxyCODONE-acetaminophen (PERCOCET/ROXICET) 5-325 MG tablet, Take 1-2 tablets by mouth every 6 (six) hours as needed., Disp: 20 tablet, Rfl: 0 .  Polyethyl Glycol-Propyl Glycol (SYSTANE ULTRA) 0.4-0.3 % SOLN, Apply to eye. 1-2 drops as needed, Disp: , Rfl:  .  vitamin C (ASCORBIC ACID) 500 MG tablet, Take 500 mg by mouth daily., Disp: , Rfl:  .  vitamin E 400 UNIT capsule, Take 400 Units by mouth daily.  , Disp: , Rfl:  .  [DISCONTINUED] mometasone (ASMANEX) 220 MCG/INH inhaler, Inhale 2 puffs into the lungs 2 (two) times daily. (Patient not taking: Reported on 05/08/2015), Disp: 3 Inhaler, Rfl: 3    Review of Systems     Objective:   Physical Exam  Filed Vitals:   08/06/15 1209  BP: 136/84  Pulse: 80  Height: 5' 6"  (1.676 m)  Weight: 222 lb 9.6 oz (100.971 kg)  SpO2: 93%   Obese Pleasant Uses walker CTA bilaterally Normal heart sounds     Assessment:       ICD-9-CM ICD-10-CM   1. Chronic cough 786.2 R05   2. Cough variant asthma 493.82 J45.991   3. Lung nodule 793.11 R91.1 CT Chest Wo Contrast       Plan:     1. Chronic cough 2. Cough variant asthma  - glad better but raspy voice can be from flovent or from cough neuropathy itself  Plan   -change flovent to arnuity low dose; if expensive call us  3. Lung nodule - stable for many many months  - do CT chest without contrast in 6 months from April 2017 Followup  after CT chest in 6 months - see DR Chase Caller   (> 50% of this 15 min visit spent in face to face counseling or/and coordination of care)   Dr. Brand Males, [name], [facility].C.P Pulmonary and Critical Care Medicine Staff Physician [place] [facility] Pager: [phone], If no answer or between  15:00h - 7:00h: call [phone]  08/06/2015 5:30 PM

## 2015-08-06 NOTE — Patient Instructions (Addendum)
1. Chronic cough 2. Cough variant asthma  - glad better but raspy voice can be from flovent or from cough neuropathy itself  Plan  -change flovent to arnuity low dose; if expensive call us  3. Lung nodule - stable for many many months  - do CT chest without contrast in 6 months from April 2017  Followup  after CT chest in 6 months - see DR Chase Caller

## 2015-08-06 NOTE — Telephone Encounter (Signed)
Ashely  pls change fu ct chst wo contrast and fu to see me to 6 months from mid aprl 2017; lung nodule fu

## 2015-08-11 NOTE — Telephone Encounter (Signed)
CT order changed.  Nothing further needed.

## 2015-08-13 ENCOUNTER — Ambulatory Visit (INDEPENDENT_AMBULATORY_CARE_PROVIDER_SITE_OTHER): Payer: PPO | Admitting: Diagnostic Neuroimaging

## 2015-08-13 ENCOUNTER — Encounter (INDEPENDENT_AMBULATORY_CARE_PROVIDER_SITE_OTHER): Payer: Self-pay | Admitting: Diagnostic Neuroimaging

## 2015-08-13 DIAGNOSIS — M79606 Pain in leg, unspecified: Secondary | ICD-10-CM | POA: Diagnosis not present

## 2015-08-13 DIAGNOSIS — Z0289 Encounter for other administrative examinations: Secondary | ICD-10-CM

## 2015-08-17 DIAGNOSIS — I10 Essential (primary) hypertension: Secondary | ICD-10-CM | POA: Diagnosis not present

## 2015-08-17 DIAGNOSIS — R8299 Other abnormal findings in urine: Secondary | ICD-10-CM | POA: Diagnosis not present

## 2015-08-17 DIAGNOSIS — E119 Type 2 diabetes mellitus without complications: Secondary | ICD-10-CM | POA: Diagnosis not present

## 2015-08-17 DIAGNOSIS — N390 Urinary tract infection, site not specified: Secondary | ICD-10-CM | POA: Diagnosis not present

## 2015-08-17 DIAGNOSIS — E784 Other hyperlipidemia: Secondary | ICD-10-CM | POA: Diagnosis not present

## 2015-08-17 DIAGNOSIS — M810 Age-related osteoporosis without current pathological fracture: Secondary | ICD-10-CM | POA: Diagnosis not present

## 2015-08-18 ENCOUNTER — Ambulatory Visit: Payer: PPO | Admitting: Diagnostic Neuroimaging

## 2015-08-18 ENCOUNTER — Ambulatory Visit (INDEPENDENT_AMBULATORY_CARE_PROVIDER_SITE_OTHER): Payer: PPO | Admitting: Diagnostic Neuroimaging

## 2015-08-18 ENCOUNTER — Encounter: Payer: Self-pay | Admitting: Diagnostic Neuroimaging

## 2015-08-18 VITALS — BP 140/88 | HR 64 | Ht 66.0 in | Wt 226.2 lb

## 2015-08-18 DIAGNOSIS — M79606 Pain in leg, unspecified: Secondary | ICD-10-CM

## 2015-08-18 DIAGNOSIS — M5416 Radiculopathy, lumbar region: Secondary | ICD-10-CM

## 2015-08-18 NOTE — Procedures (Signed)
   [facility]  NCS (NERVE CONDUCTION STUDY) WITH EMG (ELECTROMYOGRAPHY) REPORT   STUDY DATE: 08/18/15 PATIENT NAME: Ashley Parker DOB: [birth year] MRN: [id number]  ORDERING CLINICIAN: R Avva  TECHNOLOGIST: Laretta Alstrom  ELECTROMYOGRAPHER: Earlean Polka. Ladarryl Wrage, MD  CLINICAL INFORMATION: 82 year old female with lower extremity pain (right greater than left). History of left-sided pelvic fractures, sacral fracture, emergency room evaluation in March 2017.   FINDINGS: NERVE CONDUCTION STUDY: Left peroneal and bilateral tibial motor responses and F wave latencies are normal. Right peroneal motor response has decreased amplitude, normal conduction velocities and normal F-wave latency. Bilateral H reflex responses are normal. Bilateral peroneal sensory responses are normal.   NEEDLE ELECTROMYOGRAPHY: Needle examination of selected muscles of right lower extremity: Gluteus medius - no abnormal spontaneous activity; decreased motor unit recruitment on exertion Vastus medialis - no abnormal spontaneous activity; normal motor unit recruitment on exertion Biceps femoris, short head - no abnormal spontaneous activity; decreased motor unit recruitment on exertion Tibialis anterior - 3+ positive sharp waves and fibrillation potentials; markedly decreased motor unit recruitment on exertion Gastrocnemius - no abnormal spontaneous activity; decreased motor unit recruitment on exertion Right L5-S1 paraspinal muscles - increased insertional activity   IMPRESSION:  Abnormal study demonstrating: 1. Electrodiagnostic evidence of right L5 radiculopathy with active and chronic denervation. 2. No definite widespread underlying large fiber polyneuropathy.    INTERPRETING PHYSICIAN:  Penni Bombard, MD Certified in Neurology, Neurophysiology and Neuroimaging  [facility] [address] [place], [place] [address] [phone]

## 2015-08-18 NOTE — Progress Notes (Signed)
[facility]  PATIENT: Ashley Parker DOB: 1931/09/08  REFERRING CLINICIAN: Avva  HISTORY FROM: patient and daughter  REASON FOR VISIT: follow up   HISTORICAL  CHIEF COMPLAINT:  Chief Complaint  Patient presents with  . Pain    rm 7, New pt, dgtr- [facility] [place], "fell 3/9 w/pelvic fx, spontaneous compression fx 4/10; pain from knees to feet"; EMG/NCS 08/13/15    HISTORY OF PRESENT ILLNESS:   82 year old female here for evaluation of lower extremity numbness, pain, weakness and gait difficulty. March 2017 patient fell down while lifting a television. Patient was diagnosed with nondisplaced fracture involving left superior pubic ramus, nondisplaced fracture involving the left inferior pubic ramus. This was treated can conservatively. In April 2017 patient noticed increasing low back pain. Subacute fractures of the pelvis and sacrum were noted and unchanged. In addition mild endplate impaction of thoracic and lumbar vertebral bodies were noted. Again symptoms are managed conservatively with pain medication. Around this time patient noticed increasing pain and numbness shooting down the right leg, sometimes the left leg. She then went to PCP and then orthopedic surgery for evaluation. Again conservative management was recommended with physical therapy. She went to pain management Dr. Mina Marble, but epidural steroid injection was not recommended. Apparently physical therapist recommended patient to see neurologist for further evaluation. Patient requested this to PCP and therefore this evaluation was set up including an EMG nerve conduction study last week.  EMG nerve conduction study demonstrated evidence of active and chronic denervation related to right L5 radiculopathy.  Patient currently taking pain medication, gabapentin, oxycodone, ibuprofen with mild relief. Patient using a walker for ambulation and safety. Patient asking if there is any other treatments available to  accelerate healing, nerve recovery, and improve balance and walking.    REVIEW OF SYSTEMS: Full 14 system review of systems performed and negative with exception of: Blurred vision double vision hearing loss ringing in ears aching muscles. History of Crohn's disease. History of asthma.  ALLERGIES: Allergies  Allergen Reactions  . Wasp Venom Anaphylaxis and Other (See Comments)    Severe pain in lower stomach, sweatiness, and BP drops low   . Biaxin [Clarithromycin] Other (See Comments)    achy   . Brimonidine Other (See Comments)    redness    HOME MEDICATIONS: Outpatient Prescriptions Prior to Visit  Medication Sig Dispense Refill  . aspirin EC 81 MG tablet Take 81 mg by mouth daily.    [name] b complex vitamins tablet Take 1 tablet by mouth daily.    . Calcium Carbonate (CALCIUM 600 PO) Take 1 tablet by mouth 3 (three) times daily.    . Cholecalciferol (VITAMIN D) 2000 units tablet Take 2,000 Units by mouth daily.    . Dorzolamide HCl-Timolol Mal PF 22.3-6.8 MG/ML SOLN Place 1 drop into both eyes 2 (two) times daily.     [name] EPINEPHrine 0.3 mg/0.3 mL IJ SOAJ injection Inject 0.3 mg into the muscle as needed (for allergic reaction).    . Flaxseed, Linseed, (FLAXSEED OIL PO) Take 1 tablet by mouth daily.    . fluticasone (FLONASE) 50 MCG/ACT nasal spray Place 1 spray into both nostrils daily.     . Fluticasone Furoate (ARNUITY ELLIPTA) 100 MCG/ACT AEPB Inhale 1 puff into the lungs daily. 2 each 0  . gabapentin (NEURONTIN) 300 MG capsule Take 1-2 tablets 3 times a day    . ibuprofen (ADVIL,MOTRIN) 200 MG tablet Take 2 tabs 3 times a day    . irbesartan (AVAPRO)  150 MG tablet Take 150 mg by mouth daily.    [name] LUMIGAN 0.01 % SOLN Place 1 drop into both eyes at bedtime.  4  . methocarbamol (ROBAXIN) 750 MG tablet Take 750 mg by mouth 3 (three) times daily.    . Multiple Vitamins-Minerals (ICAPS AREDS 2 PO) Take 4 capsules by mouth daily.    [name] omeprazole (PRILOSEC) 20 MG capsule Take 20 mg by  mouth daily.    [name] oxyCODONE-acetaminophen (PERCOCET/ROXICET) 5-325 MG tablet Take 1-2 tablets by mouth every 6 (six) hours as needed. 20 tablet 0  . Polyethyl Glycol-Propyl Glycol (SYSTANE ULTRA) 0.4-0.3 % SOLN Apply to eye. 1-2 drops as needed    . vitamin C (ASCORBIC ACID) 500 MG tablet Take 500 mg by mouth daily.    . vitamin E 400 UNIT capsule Take 400 Units by mouth daily.      [name] docusate sodium (COLACE) 100 MG capsule Take 1 capsule (100 mg total) by mouth every 12 (twelve) hours. (Patient not taking: Reported on 08/18/2015) 60 capsule 0   No facility-administered medications prior to visit.    PAST MEDICAL HISTORY: Past Medical History  Diagnosis Date  . Osteoporosis   . Hypertension   . Obesity, unspecified   . Unspecified vitamin D deficiency   . Glaucoma   . Arthritis   . Macular degeneration   . Wrist fracture, left 1943, 1985  . Pelvic fracture ([place]) 2007  . Hemorrhoids     Internal and External  . Stricture of esophagus     distal  . Fundic gland polyposis of stomach   . GERD (gastroesophageal reflux disease)   . Crohn's ileitis ([place])     Mild, mostly asymptomatic, not on therapy  . Cough variant asthma 01/20/2015    PAST SURGICAL HISTORY: Past Surgical History  Procedure Laterality Date  . Cataract extraction    . Total knee arthroplasty Left 2007  . Colonoscopy  07/27/2001, 07/08/2011    Multiple  . Tonsillectomy and adenoidectomy    . Esophagogastroduodenoscopy      multiple  . Laparoscopic appendectomy N/A 02/19/2014    Procedure: APPENDECTOMY LAPAROSCOPIC;  Surgeon: Erroll Luna, MD;  Location: [facility];  Service: General;  Laterality: N/A;    FAMILY HISTORY: Family History  Problem Relation Age of Onset  . Prostate cancer Father   . Breast cancer Mother   . Heart disease Mother   . Uterine cancer Mother   . Colon cancer Neg Hx     SOCIAL HISTORY:  Social History   Social History  . Marital Status: Married    Spouse Name: Herbie Baltimore  . Number  of Children: 5  . Years of Education: 16   Occupational History  . Retired    Social History Main Topics  . Smoking status: Former Smoker -- 1.00 packs/day for 32 years    Types: Cigarettes    Quit date: 02/19/1984  . Smokeless tobacco: Never Used  . Alcohol Use: 0.0 oz/week    0 Standard drinks or equivalent per week     Comment: 1 drink daily   . Drug Use: No  . Sexual Activity: Not Currently   Other Topics Concern  . Not on file   Social History Narrative   Regular exercise-yes - aqua aerobics, walking 1hr x 5 each week   Married   5 children, 22 grandchildren, worked in past as garden Building services engineer, Education officer, community, Electrical engineer   Daily caffeine x 2     [place]  GENERAL EXAM/CONSTITUTIONAL: Vitals:  Filed Vitals:   08/18/15 1257  BP: 140/88  Pulse: 64  Height: 5' 6"  (1.676 m)  Weight: 226 lb 3.2 oz (102.604 kg)     Body mass index is 36.53 kg/(m^2).  No exam data present  Patient is in no distress; well developed, nourished and groomed; neck is supple  CARDIOVASCULAR:  Examination of carotid arteries is normal; no carotid bruits  Regular rate and rhythm, no murmurs  Examination of peripheral vascular system by observation and palpation is normal  EYES:  Ophthalmoscopic exam of optic discs and posterior segments is normal; no papilledema or hemorrhages  MUSCULOSKELETAL:  Gait, strength, tone, movements noted in Neurologic exam below  NEUROLOGIC: MENTAL STATUS:  No flowsheet data found.  awake, alert, oriented to person, place and time  recent and remote memory intact  normal attention and concentration  language fluent, comprehension intact, naming intact,   fund of knowledge appropriate  CRANIAL NERVE:   2nd - no papilledema on fundoscopic exam  2nd, 3rd, 4th, 6th - pupils equal and reactive to light, visual fields full to confrontation, extraocular muscles intact, no nystagmus  5th - facial sensation symmetric  7th - facial  strength symmetric  8th - hearing intact  9th - palate elevates symmetrically, uvula midline  11th - shoulder shrug symmetric  12th - tongue protrusion midline  MOTOR:   normal bulk and tone, full strength in the BUE, BLE; EXCEPT LEFT DELTOID LIMITED (3-4) BY PAIN  SENSORY:   normal and symmetric to light touch, pinprick, temperature; DECR VIB AT TOES  COORDINATION:   finger-nose-finger, fine finger movements normal  REFLEXES:   deep tendon reflexes TRACE and symmetric; ABSENT AT ANKLES  GAIT/STATION:   narrow based gait; USING WALKER     DIAGNOSTIC DATA (LABS, IMAGING, TESTING) - I reviewed patient records, labs, notes, testing and imaging myself where available.  Lab Results  Component Value Date   WBC 10.2 06/08/2015   HGB 13.8 06/08/2015   HCT 42.3 06/08/2015   MCV 87.8 06/08/2015   PLT 303 06/08/2015      Component Value Date/Time   NA 141 06/08/2015 0157   K 3.8 06/08/2015 0157   CL 106 06/08/2015 0157   CO2 22 06/08/2015 0157   GLUCOSE 120* 06/08/2015 0157   BUN 13 06/08/2015 0157   CREATININE 0.68 06/08/2015 0157   CALCIUM 9.2 06/08/2015 0157   PROT 6.6 06/08/2015 0157   ALBUMIN 3.6 06/08/2015 0157   AST 30 06/08/2015 0157   ALT 32 06/08/2015 0157   ALKPHOS 94 06/08/2015 0157   BILITOT 1.2 06/08/2015 0157   GFRNONAA >60 06/08/2015 0157   GFRAA >60 06/08/2015 0157   Lab Results  Component Value Date   CHOL 221* 06/18/2010   HDL 60.60 06/18/2010   LDLCALC 124* 06/05/2008   LDLDIRECT 139.7 06/18/2010   TRIG 99.0 06/18/2010   CHOLHDL 4 06/18/2010   Lab Results  Component Value Date   HGBA1C 5.8 06/18/2010   No results found for: VITAMINB12 Lab Results  Component Value Date   TSH 2.57 06/18/2010    05/08/15 CT pelvis [I reviewed images myself and agree with interpretation. -VRP]  - Nondisplaced fracture involving the left superior pubic ramus. - Mildly comminuted, nondisplaced fracture involving the left inferior pubic ramus. - No  definite fracture involving the left proximal femur.  07/16/15 MRI lumbar spine [I reviewed images myself and agree with interpretation. Also there is mild biforaminal stenosis at L5-S1. -VRP]  - Subacute L5  vertebral body compression fracture (50% loss of height) with persistent marrow edema within L5 vertebral body. - Lumbar spine spondylosis  08/18/15 EMG/NCS 1. Electrodiagnostic evidence of right L5 radiculopathy with active and chronic denervation. 2. No definite widespread underlying large fiber polyneuropathy.     ASSESSMENT AND PLAN  82 y.o. year old female here with fall in March 2017 with pelvic bone fractures, increasing pain April 2017 with spontaneous vertebral body compression fractures, here for evaluation of increasing pain in bilateral lower extremities. Most likely represents combination of right lumbar radiculopathy, musculoskeletal strain / sprain, arthritis and degenerative changes.    Dx: right lumbar radiculopathy; musculoskeletal pain; balance difficulty  1. Pain of lower extremity, unspecified laterality   2. Right lumbar radiculopathy      PLAN: I spent 40 minutes of face to face time with patient. Greater than 50% of time was spent in counseling and coordination of care with patient. In summary we discussed:  - Advised patient on options for conservative management which has been suggested by PCP and orthopedic surgery thus far. This includes combination of anti-inflammatory pain medication, neuropathic pain medication, narcotic pain medication, physical therapy; in addition other treatments can be considered such as acupuncture, dry needling, massage therapy, biofeedback, water therapy. - continue pain medications and PT - return to Dr. Dagmar Hait and Dr. Berenice Primas  Return if symptoms worsen or fail to improve, for return to PCP.    Penni Bombard, MD 8/48/5927, 6:39 PM Certified in Neurology, Neurophysiology and Neuroimaging  [facility] [address], [place], [place] [address] [phone]

## 2015-08-20 ENCOUNTER — Encounter: Payer: PPO | Admitting: Diagnostic Neuroimaging

## 2015-08-24 DIAGNOSIS — E784 Other hyperlipidemia: Secondary | ICD-10-CM | POA: Diagnosis not present

## 2015-08-24 DIAGNOSIS — Z1389 Encounter for screening for other disorder: Secondary | ICD-10-CM | POA: Diagnosis not present

## 2015-08-24 DIAGNOSIS — M810 Age-related osteoporosis without current pathological fracture: Secondary | ICD-10-CM | POA: Diagnosis not present

## 2015-08-24 DIAGNOSIS — Z0000 Encounter for general adult medical examination without abnormal findings: Secondary | ICD-10-CM | POA: Diagnosis not present

## 2015-08-24 DIAGNOSIS — S3289XS Fracture of other parts of pelvis, sequela: Secondary | ICD-10-CM | POA: Diagnosis not present

## 2015-08-24 DIAGNOSIS — K5090 Crohn's disease, unspecified, without complications: Secondary | ICD-10-CM | POA: Diagnosis not present

## 2015-08-24 DIAGNOSIS — H547 Unspecified visual loss: Secondary | ICD-10-CM | POA: Diagnosis not present

## 2015-08-24 DIAGNOSIS — J449 Chronic obstructive pulmonary disease, unspecified: Secondary | ICD-10-CM | POA: Diagnosis not present

## 2015-08-24 DIAGNOSIS — E119 Type 2 diabetes mellitus without complications: Secondary | ICD-10-CM | POA: Diagnosis not present

## 2015-08-24 DIAGNOSIS — I5030 Unspecified diastolic (congestive) heart failure: Secondary | ICD-10-CM | POA: Diagnosis not present

## 2015-08-24 DIAGNOSIS — I10 Essential (primary) hypertension: Secondary | ICD-10-CM | POA: Diagnosis not present

## 2015-08-25 ENCOUNTER — Ambulatory Visit: Payer: PPO | Admitting: Diagnostic Neuroimaging

## 2015-09-02 ENCOUNTER — Encounter: Payer: PPO | Admitting: Diagnostic Neuroimaging

## 2015-09-03 DIAGNOSIS — M545 Low back pain: Secondary | ICD-10-CM | POA: Diagnosis not present

## 2015-09-03 DIAGNOSIS — S32050D Wedge compression fracture of fifth lumbar vertebra, subsequent encounter for fracture with routine healing: Secondary | ICD-10-CM | POA: Diagnosis not present

## 2015-09-04 DIAGNOSIS — M810 Age-related osteoporosis without current pathological fracture: Secondary | ICD-10-CM | POA: Diagnosis not present

## 2015-10-12 ENCOUNTER — Telehealth: Payer: Self-pay | Admitting: Internal Medicine

## 2015-10-12 MED ORDER — FLUTICASONE FUROATE 100 MCG/ACT IN AEPB: 1.0000 | INHALATION_SPRAY | Freq: Every day | RESPIRATORY_TRACT | 5 refills | Status: DC

## 2015-10-12 NOTE — Telephone Encounter (Signed)
Called spoke with pt. She states that the arnuity is working well for her and she is requesting a prescription of the arnuity be sent to [facility]. I explained to her that I would send the rx in today. She voiced understanding and had no further questions. Rx sent electronically, nothing further needed.

## 2015-10-16 ENCOUNTER — Telehealth: Payer: Self-pay | Admitting: Internal Medicine

## 2015-10-16 MED ORDER — FLUTICASONE FUROATE 100 MCG/ACT IN AEPB: 1.0000 | INHALATION_SPRAY | Freq: Every day | RESPIRATORY_TRACT | 3 refills | Status: DC

## 2015-10-16 NOTE — Telephone Encounter (Signed)
Taryn with [facility] [place] returned Leigh's call from earlier.  She states we needed the name of the medication and it is Arnuity.

## 2015-10-16 NOTE — Telephone Encounter (Signed)
arnuity has been sent in for 90 day supply to the mail order pharmacy. Nothing further is needed.

## 2015-10-16 NOTE — Telephone Encounter (Signed)
lmomtcb x1 

## 2015-10-21 DIAGNOSIS — M810 Age-related osteoporosis without current pathological fracture: Secondary | ICD-10-CM | POA: Diagnosis not present

## 2015-10-27 DIAGNOSIS — H401133 Primary open-angle glaucoma, bilateral, severe stage: Secondary | ICD-10-CM | POA: Diagnosis not present

## 2015-10-27 DIAGNOSIS — H16141 Punctate keratitis, right eye: Secondary | ICD-10-CM | POA: Diagnosis not present

## 2015-10-27 DIAGNOSIS — Z961 Presence of intraocular lens: Secondary | ICD-10-CM | POA: Diagnosis not present

## 2015-10-27 DIAGNOSIS — H353131 Nonexudative age-related macular degeneration, bilateral, early dry stage: Secondary | ICD-10-CM | POA: Diagnosis not present

## 2015-12-10 ENCOUNTER — Ambulatory Visit (INDEPENDENT_AMBULATORY_CARE_PROVIDER_SITE_OTHER)
Admission: RE | Admit: 2015-12-10 | Discharge: 2015-12-10 | Disposition: A | Discharge status: Home / Self Care | Payer: PPO | Source: Ambulatory Visit | Attending: Internal Medicine | Admitting: Internal Medicine

## 2015-12-10 DIAGNOSIS — R911 Solitary pulmonary nodule: Secondary | ICD-10-CM

## 2015-12-18 ENCOUNTER — Telehealth: Payer: Self-pay | Admitting: Internal Medicine

## 2015-12-18 NOTE — Telephone Encounter (Signed)
Result Notes   Notes Recorded by Brand Males, MD on 12/15/2015 at 4:52 PM EDT Lung nodules stable LUL 1.9cm with 82m solid central compnent. That is stable since April 2017 but compared to 2010 ithe solid part id new and worrisome for slow type of lung cancer. and RUL - posterior segment . Will discuss serial fu v interventiomn At fu dec 2017   Spoke with pt and notified of results per Dr. RChase Caller Pt verbalized understanding and denied any questions.

## 2015-12-18 NOTE — Progress Notes (Signed)
See phone note 12/18/15

## 2016-02-02 ENCOUNTER — Ambulatory Visit: Payer: PPO | Admitting: Internal Medicine

## 2016-02-03 ENCOUNTER — Ambulatory Visit: Payer: PPO | Admitting: Internal Medicine

## 2016-02-05 ENCOUNTER — Ambulatory Visit: Payer: PPO | Admitting: Acute Care

## 2016-02-11 ENCOUNTER — Encounter: Payer: Self-pay | Admitting: Acute Care

## 2016-02-11 ENCOUNTER — Ambulatory Visit (INDEPENDENT_AMBULATORY_CARE_PROVIDER_SITE_OTHER): Payer: PPO | Admitting: Acute Care

## 2016-02-11 DIAGNOSIS — J45991 Cough variant asthma: Secondary | ICD-10-CM

## 2016-02-11 NOTE — Progress Notes (Signed)
History of Present Illness Ashley Parker is a 82 y.o. female with chronic cough and asthma followed by Dr. Chase Caller.   82 year old obese lady who is very functional. Transfer of care to Dr Chase Caller due to chronic cough. Cough started after a respiratory infection. The other associated symptoms like shortness of breath but they have resolved. Currently left with residual cough. This a previous history of heavy smoking 30 pack history but quit over 30 years ago. Currently on Advair but not fully helping. RSI cough score and cough details are below; score is 9. She had pulmonary function test in May 2016 . This shows isolated reduction in diffusion capacity to 63% but otherwise normal. She is frustrated by her cough. Cough is of moderate intensity. It is persistent. No clear cut aggravating or relieving factors. Associated chest x-ray but done in 2010 that I personally visualized has clear lung fields but shows hyperinflation. Inhaler therapy is not working   02/11/2016 6 month follow up appointment: Pt presents for a 6 month follow up. She is doing well. 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 She is very active. She currently has no issues with cough .She is compliant with her Arnuity and Nasocort. She does not feel she has asthma as she is doing so well with all of her increased activities..She has  not had to use her Pro Air inhaler and a significant period of time. She is questioning whether or not she actually needs to continue taking the [place]. We had a discussion about the fact that she may be feeling so well because the maintenance medication is doing its job. I have told her to continue taking the medication, and we'll message Dr. Chase Caller to confirm that this is what he wants her to do. She denies fever, chest pain, orthopnea, or hemoptysis. No recent airline or automobile travel.   Past medical  hx Past Medical History:  Diagnosis Date  . Arthritis   . Cough variant asthma 01/20/2015  . Crohn's ileitis ([place])    Mild, mostly asymptomatic, not on therapy  . Fundic gland polyposis of stomach   . GERD (gastroesophageal reflux disease)   . Glaucoma   . Hemorrhoids    Internal and External  . Hypertension   . Macular degeneration   . Obesity, unspecified   . Osteoporosis   . Pelvic fracture ([place]) 2007  . Stricture of esophagus    distal  . Unspecified vitamin D deficiency   . Wrist fracture, left 1943, 1985     Past surgical hx, Family hx, Social hx all reviewed.  Current Outpatient Prescriptions on File Prior to Visit  Medication Sig  . aspirin EC 81 MG tablet Take 81 mg by mouth daily.  [name] b complex vitamins tablet Take 1 tablet by mouth daily.  . Calcium Carbonate (CALCIUM 600 PO) Take 1 tablet by mouth 3 (three) times daily.  . Cholecalciferol (VITAMIN D) 2000 units tablet Take 2,000 Units by mouth daily.  . Dorzolamide HCl-Timolol Mal PF 22.3-6.8 MG/ML SOLN Place 1 drop into both eyes 2 (two) times daily.   [name] EPINEPHrine 0.3 mg/0.3 mL IJ SOAJ injection Inject 0.3 mg into the muscle as needed (for allergic reaction).  . Flaxseed, Linseed, (FLAXSEED OIL PO) Take 1 tablet by mouth daily.  . fluticasone (FLONASE) 50 MCG/ACT nasal spray Place 1 spray into both nostrils daily.   . Fluticasone Furoate (ARNUITY ELLIPTA) 100 MCG/ACT AEPB  Inhale 1 puff into the lungs daily.  [name] gabapentin (NEURONTIN) 300 MG capsule Take 1 tablet 3 times a day  . ibuprofen (ADVIL,MOTRIN) 200 MG tablet Take 2 tabs 3 times a day  . irbesartan (AVAPRO) 150 MG tablet Take 150 mg by mouth daily.  [name] LUMIGAN 0.01 % SOLN Place 1 drop into both eyes at bedtime.  . methocarbamol (ROBAXIN) 750 MG tablet Take 750 mg by mouth 3 (three) times daily.  . Multiple Vitamins-Minerals (ICAPS AREDS 2 PO) Take 4 capsules by mouth daily.  [name] omeprazole (PRILOSEC) 20 MG capsule Take 20 mg by mouth daily.  . vitamin C  (ASCORBIC ACID) 500 MG tablet Take 500 mg by mouth daily.  . vitamin E 400 UNIT capsule Take 400 Units by mouth daily.    . [DISCONTINUED] mometasone (ASMANEX) 220 MCG/INH inhaler Inhale 2 puffs into the lungs 2 (two) times daily. (Patient not taking: Reported on 05/08/2015)   No current facility-administered medications on file prior to visit.      Allergies  Allergen Reactions  . Wasp Venom Anaphylaxis and Other (See Comments)    Severe pain in lower stomach, sweatiness, and BP drops low   . Biaxin [Clarithromycin] Other (See Comments)    achy   . Brimonidine Other (See Comments)    redness    Review Of Systems:  Constitutional:   No  weight loss, night sweats,  Fevers, chills, fatigue, or  lassitude.  HEENT:   No headaches,  Difficulty swallowing,  Tooth/dental problems, or  Sore throat,                No sneezing, itching, ear ache, nasal congestion, post nasal drip,   CV:  No chest pain,  Orthopnea, PND, swelling in lower extremities, anasarca, dizziness, palpitations, syncope.   GI  No heartburn, indigestion, abdominal pain, nausea, vomiting, diarrhea, change in bowel habits, loss of appetite, bloody stools.   Resp: No shortness of breath with exertion or at rest.  No excess mucus, no productive cough,  No non-productive cough,  No coughing up of blood.  No change in color of mucus.  No wheezing.  No chest wall deformity  Skin: no rash or lesions.  GU: no dysuria, change in color of urine, no urgency or frequency.  No flank pain, no hematuria   MS:  No joint pain or swelling.  No decreased range of motion.  No back pain.  Psych:  No change in mood or affect. No depression or anxiety.  No memory loss.   Vital Signs BP 118/80 (BP Location: Left Arm, Patient Position: Sitting, Cuff Size: Normal)   Pulse 60   Ht 5' 6"  (1.676 m)   Wt 225 lb 3.2 oz (102.2 kg)   SpO2 95%   BMI 36.35 kg/m   Body mass index is 36.35 kg/m. Physical Exam:  General- No distress,  A&Ox3,  overweight ENT: No sinus tenderness, TM clear, pale nasal mucosa, no oral exudate,no post nasal drip, no LAN Cardiac: S1, S2, regular rate and rhythm, no murmur Chest: No wheeze/ rales/ dullness; no accessory muscle use, no nasal flaring, no sternal retractions Abd.: Soft Non-tender Ext: No clubbing cyanosis, edema Neuro:  normal strength Skin: No rashes, warm and dry Psych: normal mood and behavior   Assessment/Plan  Cough variant asthma Stable 6 month interval. Denies cough or shortness of breath Patient is compliant with our Arnuity once daily. As she is currently in the doughnut hole she is taking it once every other  day Plan: Continue your Arnuity daily  Continue your Nasocort as you have been doing. Continue your physical activity. It is great for your lungs!! Follow up with Dr. Chase Caller in 6 months. Please contact office for sooner follow up if symptoms do not improve or worsen or seek emergency care       Magdalen Spatz, NP 02/11/2016  3:30 PM

## 2016-02-11 NOTE — Patient Instructions (Addendum)
It is nice to meet you today. Continue your Arnuity daily  Continue your Nasocort as you have been doing. Continue your physical activity. It is great for your lungs!! Follow up with Dr. Chase Caller in 6 months. Please contact office for sooner follow up if symptoms do not improve or worsen or seek emergency care

## 2016-02-11 NOTE — Assessment & Plan Note (Signed)
Stable 6 month interval. Denies cough or shortness of breath Patient is compliant with our Arnuity once daily. As she is currently in the doughnut hole she is taking it once every other day Plan: Continue your Arnuity daily  Continue your Nasocort as you have been doing. Continue your physical activity. It is great for your lungs!! Follow up with Dr. Chase Caller in 6 months. Please contact office for sooner follow up if symptoms do not improve or worsen or seek emergency care

## 2016-02-16 DIAGNOSIS — Z961 Presence of intraocular lens: Secondary | ICD-10-CM | POA: Diagnosis not present

## 2016-02-16 DIAGNOSIS — H353130 Nonexudative age-related macular degeneration, bilateral, stage unspecified: Secondary | ICD-10-CM | POA: Diagnosis not present

## 2016-02-16 DIAGNOSIS — H401133 Primary open-angle glaucoma, bilateral, severe stage: Secondary | ICD-10-CM | POA: Diagnosis not present

## 2016-02-16 DIAGNOSIS — H16141 Punctate keratitis, right eye: Secondary | ICD-10-CM | POA: Diagnosis not present

## 2016-02-18 DIAGNOSIS — E784 Other hyperlipidemia: Secondary | ICD-10-CM | POA: Diagnosis not present

## 2016-02-18 DIAGNOSIS — J449 Chronic obstructive pulmonary disease, unspecified: Secondary | ICD-10-CM | POA: Diagnosis not present

## 2016-02-18 DIAGNOSIS — M810 Age-related osteoporosis without current pathological fracture: Secondary | ICD-10-CM | POA: Diagnosis not present

## 2016-02-18 DIAGNOSIS — E119 Type 2 diabetes mellitus without complications: Secondary | ICD-10-CM | POA: Diagnosis not present

## 2016-02-18 DIAGNOSIS — I10 Essential (primary) hypertension: Secondary | ICD-10-CM | POA: Diagnosis not present

## 2016-02-18 DIAGNOSIS — K5090 Crohn's disease, unspecified, without complications: Secondary | ICD-10-CM | POA: Diagnosis not present

## 2016-02-29 HISTORY — PX: WRIST FRACTURE SURGERY: SHX121

## 2016-03-01 DIAGNOSIS — H401133 Primary open-angle glaucoma, bilateral, severe stage: Secondary | ICD-10-CM | POA: Diagnosis not present

## 2016-04-01 DIAGNOSIS — Z6838 Body mass index (BMI) 38.0-38.9, adult: Secondary | ICD-10-CM | POA: Diagnosis not present

## 2016-04-01 DIAGNOSIS — Z124 Encounter for screening for malignant neoplasm of cervix: Secondary | ICD-10-CM | POA: Diagnosis not present

## 2016-04-12 DIAGNOSIS — Z961 Presence of intraocular lens: Secondary | ICD-10-CM | POA: Diagnosis not present

## 2016-04-12 DIAGNOSIS — H353130 Nonexudative age-related macular degeneration, bilateral, stage unspecified: Secondary | ICD-10-CM | POA: Diagnosis not present

## 2016-04-12 DIAGNOSIS — H401113 Primary open-angle glaucoma, right eye, severe stage: Secondary | ICD-10-CM | POA: Diagnosis not present

## 2016-04-12 DIAGNOSIS — H401121 Primary open-angle glaucoma, left eye, mild stage: Secondary | ICD-10-CM | POA: Diagnosis not present

## 2016-04-12 DIAGNOSIS — H16141 Punctate keratitis, right eye: Secondary | ICD-10-CM | POA: Diagnosis not present

## 2016-05-27 ENCOUNTER — Emergency Department (HOSPITAL_COMMUNITY)
Admission: EM | Admit: 2016-05-27 | Discharge: 2016-05-27 | Disposition: A | Discharge status: Home / Self Care | Payer: PPO | Attending: Emergency Medicine | Admitting: Emergency Medicine

## 2016-05-27 ENCOUNTER — Emergency Department (HOSPITAL_COMMUNITY): Payer: PPO

## 2016-05-27 ENCOUNTER — Encounter (HOSPITAL_COMMUNITY): Payer: Self-pay | Admitting: Emergency Medicine

## 2016-05-27 DIAGNOSIS — S52502A Unspecified fracture of the lower end of left radius, initial encounter for closed fracture: Secondary | ICD-10-CM | POA: Diagnosis not present

## 2016-05-27 DIAGNOSIS — S52691A Other fracture of lower end of right ulna, initial encounter for closed fracture: Secondary | ICD-10-CM | POA: Insufficient documentation

## 2016-05-27 DIAGNOSIS — S52614A Nondisplaced fracture of right ulna styloid process, initial encounter for closed fracture: Secondary | ICD-10-CM | POA: Diagnosis not present

## 2016-05-27 DIAGNOSIS — W010XXA Fall on same level from slipping, tripping and stumbling without subsequent striking against object, initial encounter: Secondary | ICD-10-CM | POA: Diagnosis not present

## 2016-05-27 DIAGNOSIS — J45909 Unspecified asthma, uncomplicated: Secondary | ICD-10-CM | POA: Insufficient documentation

## 2016-05-27 DIAGNOSIS — Y9389 Activity, other specified: Secondary | ICD-10-CM | POA: Insufficient documentation

## 2016-05-27 DIAGNOSIS — Y999 Unspecified external cause status: Secondary | ICD-10-CM | POA: Insufficient documentation

## 2016-05-27 DIAGNOSIS — Z79899 Other long term (current) drug therapy: Secondary | ICD-10-CM | POA: Insufficient documentation

## 2016-05-27 DIAGNOSIS — Z7982 Long term (current) use of aspirin: Secondary | ICD-10-CM | POA: Diagnosis not present

## 2016-05-27 DIAGNOSIS — I10 Essential (primary) hypertension: Secondary | ICD-10-CM | POA: Insufficient documentation

## 2016-05-27 DIAGNOSIS — Y9289 Other specified places as the place of occurrence of the external cause: Secondary | ICD-10-CM | POA: Diagnosis not present

## 2016-05-27 DIAGNOSIS — S52592A Other fractures of lower end of left radius, initial encounter for closed fracture: Secondary | ICD-10-CM | POA: Diagnosis not present

## 2016-05-27 DIAGNOSIS — Z87891 Personal history of nicotine dependence: Secondary | ICD-10-CM | POA: Diagnosis not present

## 2016-05-27 DIAGNOSIS — S6992XA Unspecified injury of left wrist, hand and finger(s), initial encounter: Secondary | ICD-10-CM | POA: Diagnosis not present

## 2016-05-27 DIAGNOSIS — Z96652 Presence of left artificial knee joint: Secondary | ICD-10-CM | POA: Diagnosis not present

## 2016-05-27 MED ORDER — OXYCODONE-ACETAMINOPHEN 5-325 MG PO TABS
1.0000 | ORAL_TABLET | Freq: Once | ORAL | Status: AC
  Administered 2016-05-27: 1 via ORAL
  Filled 2016-05-27: qty 1

## 2016-05-27 MED ORDER — OXYCODONE-ACETAMINOPHEN 5-325 MG PO TABS: 1.0000 | ORAL_TABLET | Freq: Four times a day (QID) | ORAL | 0 refills | Status: DC | PRN

## 2016-05-27 NOTE — ED Notes (Signed)
Ortho paged for splint

## 2016-05-27 NOTE — Progress Notes (Signed)
Orthopedic Tech Progress Note Patient Details:  Ashley Parker 03/14/1931 [id number]  Ortho Devices Type of Ortho Device: Ace wrap, Arm sling, Sugartong splint Ortho Device/Splint Interventions: Application   [name] 05/27/2016, 2:58 PM

## 2016-05-27 NOTE — ED Provider Notes (Signed)
[facility] DEPT Provider Note   CSN: [id number] Arrival date & time: 05/27/16  1216     History   Chief Complaint Chief Complaint  Patient presents with  . Wrist Pain    HPI Ashley Parker is a 83 y.o. female.  Ashley Parker is a 83 y.o. Female who presents to the emergency department after a trip and fall and planing of left wrist pain. Patient is right-hand dominant. She was picking up some trash when she slipped falling with her left arm outstretched onto her hand. She denies hitting her head or loss of consciousness. She complains of pain to her left wrist. She denies any other injury. She denies numbness, tingling or weakness. She denies neck or back pain.   The history is provided by the patient and medical records. No language interpreter was used.  Wrist Pain  Pertinent negatives include no headaches.    Past Medical History:  Diagnosis Date  . Arthritis   . Cough variant asthma 01/20/2015  . Crohn's ileitis ([place])    Mild, mostly asymptomatic, not on therapy  . Fundic gland polyposis of stomach   . GERD (gastroesophageal reflux disease)   . Glaucoma   . Hemorrhoids    Internal and External  . Hypertension   . Macular degeneration   . Obesity, unspecified   . Osteoporosis   . Pelvic fracture ([place]) 2007  . Stricture of esophagus    distal  . Unspecified vitamin D deficiency   . Wrist fracture, left 1943, 1985    Patient Active Problem List   Diagnosis Date Noted  . Cough variant asthma 01/20/2015  . Lung nodule 12/09/2014  . Chronic cough 11/20/2014  . Oral thrush 11/20/2014  . Reactive airways dysfunction syndrome with acute exacerbation 08/08/2014  . Acute appendicitis 02/18/2014  . Crohn's ileitis ([place]) 08/23/2011  . GERD with stricture 08/23/2011  . History of anaphylaxis 06/18/2010  . HYPERGLYCEMIA, BORDERLINE 06/26/2008  . CERVICAL RADICULOPATHY, LEFT 06/05/2008  . DEGENERATIVE DISC DISEASE, CERVICAL SPINE, W/RADICULOPATHY 11/26/2007    . VITAMIN D DEFICIENCY 04/20/2007  . OBESITY 04/20/2007  . HYPERTENSION 04/20/2007  . OSTEOPOROSIS 04/20/2007    Past Surgical History:  Procedure Laterality Date  . CATARACT EXTRACTION    . COLONOSCOPY  07/27/2001, 07/08/2011   Multiple  . ESOPHAGOGASTRODUODENOSCOPY     multiple  . LAPAROSCOPIC APPENDECTOMY N/A 02/19/2014   Procedure: APPENDECTOMY LAPAROSCOPIC;  Surgeon: Erroll Luna, MD;  Location: [facility];  Service: General;  Laterality: N/A;  . TONSILLECTOMY AND ADENOIDECTOMY    . TOTAL KNEE ARTHROPLASTY Left 2007    OB History    No data available       Home Medications    Prior to Admission medications   Medication Sig Start Date End Date Taking? Authorizing Provider  aspirin EC 81 MG tablet Take 81 mg by mouth daily.    Historical Provider, MD  b complex vitamins tablet Take 1 tablet by mouth daily.    Historical Provider, MD  Calcium Carbonate (CALCIUM 600 PO) Take 1 tablet by mouth 3 (three) times daily.    Historical Provider, MD  Cholecalciferol (VITAMIN D) 2000 units tablet Take 2,000 Units by mouth daily.    Historical Provider, MD  Dorzolamide HCl-Timolol Mal PF 22.3-6.8 MG/ML SOLN Place 1 drop into both eyes 2 (two) times daily.     Historical Provider, MD  EPINEPHrine 0.3 mg/0.3 mL IJ SOAJ injection Inject 0.3 mg into the muscle as needed (for allergic reaction).  Historical Provider, MD  Flaxseed, Linseed, (FLAXSEED OIL PO) Take 1 tablet by mouth daily.    Historical Provider, MD  fluticasone (FLONASE) 50 MCG/ACT nasal spray Place 1 spray into both nostrils daily.     Historical Provider, MD  Fluticasone Furoate (ARNUITY ELLIPTA) 100 MCG/ACT AEPB Inhale 1 puff into the lungs daily. 10/16/15   Brand Males, MD  gabapentin (NEURONTIN) 300 MG capsule Take 1 tablet 3 times a day    Historical Provider, MD  ibuprofen (ADVIL,MOTRIN) 200 MG tablet Take 2 tabs 3 times a day    Historical Provider, MD  irbesartan (AVAPRO) 150 MG tablet Take 150 mg by mouth daily.     Historical Provider, MD  LUMIGAN 0.01 % SOLN Place 1 drop into both eyes at bedtime. 03/04/15   Historical Provider, MD  methocarbamol (ROBAXIN) 750 MG tablet Take 750 mg by mouth 3 (three) times daily.    Historical Provider, MD  Multiple Vitamins-Minerals (ICAPS AREDS 2 PO) Take 4 capsules by mouth daily.    Historical Provider, MD  omeprazole (PRILOSEC) 20 MG capsule Take 20 mg by mouth daily.    Historical Provider, MD  oxyCODONE-acetaminophen (PERCOCET) 5-325 MG tablet Take 1 tablet by mouth every 6 (six) hours as needed for moderate pain or severe pain. 05/27/16   Waynetta Pean, PA-C  triamcinolone (NASACORT ALLERGY 24HR) 55 MCG/ACT AERO nasal inhaler Place 2 sprays into the nose daily.    Historical Provider, MD  vitamin C (ASCORBIC ACID) 500 MG tablet Take 500 mg by mouth daily.    Historical Provider, MD  vitamin E 400 UNIT capsule Take 400 Units by mouth daily.      Historical Provider, MD    Family History Family History  Problem Relation Age of Onset  . Prostate cancer Father   . Breast cancer Mother   . Heart disease Mother   . Uterine cancer Mother   . Colon cancer Neg Hx     Social History Social History  Substance Use Topics  . Smoking status: Former Smoker    Packs/day: 1.00    Years: 32.00    Types: Cigarettes    Quit date: 02/19/1984  . Smokeless tobacco: Never Used  . Alcohol use 0.0 oz/week     Comment: 1 drink daily      Allergies   Wasp venom; Biaxin [clarithromycin]; and Brimonidine   Review of Systems Review of Systems  Constitutional: Negative for fever.  Eyes: Negative for visual disturbance.  Musculoskeletal: Positive for arthralgias and joint swelling. Negative for neck pain.  Skin: Negative for rash and wound.  Neurological: Negative for weakness, numbness and headaches.     Physical Exam Updated Vital Signs BP (!) 192/81 (BP Location: Right Arm)   Pulse (!) 56   Temp 99 F (37.2 C) (Oral)   Resp 18   SpO2 98%   Physical Exam   Constitutional: She is oriented to person, place, and time. She appears well-developed and well-nourished. No distress.  HENT:  Head: Normocephalic and atraumatic.  Right Ear: External ear normal.  Left Ear: External ear normal.  Eyes: Right eye exhibits no discharge. Left eye exhibits no discharge.  Cardiovascular: Normal rate, regular rhythm and intact distal pulses.   Bilateral radial pulses are intact. Good capillary refill to her bilateral distal fingertips.  Pulmonary/Chest: Effort normal. No respiratory distress.  Musculoskeletal: She exhibits edema and tenderness.  Edema and tenderness noted diffusely to her left wrist. No obvious deformity. Patient is able to make a fist.  Knuckles are in alignment. No hand tenderness to palpation. No left elbow or shoulder tenderness to palpation.  Neurological: She is alert and oriented to person, place, and time. No sensory deficit. Coordination normal.  Sensation is intact her distal fingertips.  Skin: Skin is warm and dry. Capillary refill takes less than 2 seconds. No rash noted. She is not diaphoretic. No erythema. No pallor.  Psychiatric: She has a normal mood and affect. Her behavior is normal.  Nursing note and vitals reviewed.    ED Treatments / Results  Labs (all labs ordered are listed, but only abnormal results are displayed) Labs Reviewed - No data to display  EKG  EKG Interpretation None       Radiology Dg Wrist Complete Left  Result Date: 05/27/2016 CLINICAL DATA:  Initial encounter for Fall today onto left wrist; c/o left posterior wrist pain and deformity; limited ROM EXAM: LEFT WRIST - COMPLETE 3+ VIEW COMPARISON:  None. FINDINGS: Mild osteopenia. Dorsally impacted distal radius fracture without intra-articular extension. Nondisplaced ulnar styloid fracture. Scaphoid intact. Diffuse soft tissue swelling. IMPRESSION: Distal radius and ulnar fractures, as above. Electronically Signed   By: Abigail Miyamoto [name]   On:  05/27/2016 13:30    Procedures Procedures (including critical care time) SPLINT APPLICATION Date/Time: 8:88 PM Authorized by: Hanley Hays Consent: Verbal consent obtained. Risks and benefits: risks, benefits and alternatives were discussed Consent given by: patient Splint applied by: orthopedic technician Location details: left wrist   Splint type: sugar tong Supplies used: splint and ace wrap Post-procedure: The splinted body part was neurovascularly unchanged following the procedure. Patient tolerance: Patient tolerated the procedure well with no immediate complications.    Medications Ordered in ED Medications  oxyCODONE-acetaminophen (PERCOCET/ROXICET) 5-325 MG per tablet 1 tablet (1 tablet Oral Given 05/27/16 1307)     Initial Impression / Assessment and Plan / ED Course  I have reviewed the triage vital signs and the nursing notes.  Pertinent labs & imaging results that were available during my care of the patient were reviewed by me and considered in my medical decision making (see chart for details).    This is a 83 y.o. Female who presents to the emergency department after a trip and fall and planing of left wrist pain. Patient is right-hand dominant On exam patient is afebrile nontoxic appearing. She is neurovascular intact. She has edema and tenderness noted diffusely to her left wrist. No obvious deformity. X-ray shows a distal radius and ulnar fractures. Patient was discussed with and evaluated by my attending, Dr. Eulis Foster. He suspects there is possibly up to 10% angulation. Will consult hand surgery.  I consulted with orthopedic hand surgeon Dr. Apolonio Schneiders. He believes this is from a previous fracture to her wrist. Patient does have history of previous fractured her wrist. No need for any reduction at this time. He would like the patient placed in a splint and she can follow-up next week in office. Patient agrees with plan.   Patient placed in a sugar tong  splint. Reevaluation patient has good capillary refill to her distal fingertips. She reports a splint is comfortable. I discussed return precautions and follow-up with Dr.Ortman. Percocet for pain control. I discussed pain medication precautions. I advised the patient to follow-up with their primary care provider this week. I advised the patient to return to the emergency department with new or worsening symptoms or new concerns. The patient verbalized understanding and agreement with plan.    Final Clinical Impressions(s) / ED Diagnoses  Final diagnoses:  Other closed fracture of distal end of left radius, initial encounter  Other closed fracture of distal end of right ulna, initial encounter    New Prescriptions Discharge Medication List as of 05/27/2016  2:48 PM    START taking these medications   Details  oxyCODONE-acetaminophen (PERCOCET) 5-325 MG tablet Take 1 tablet by mouth every 6 (six) hours as needed for moderate pain or severe pain., Starting Fri 05/27/2016, Print         Waynetta Pean, PA-C 05/27/16 1454    Daleen Bo, MD 05/27/16 [phone]

## 2016-05-27 NOTE — ED Notes (Signed)
Pt returned from xray

## 2016-05-27 NOTE — ED Provider Notes (Signed)
  Face-to-face evaluation   History: Elderly female isolated injury left wrist, with accidental fall.  Physical exam: Left wrist-swollen and tender dorsally, with decreased motion secondary to pain.  No gross angular deformity.  Neurovascular intact in left hand, and fingers  MDM-imaging reveals impacted and slightly angulated, about 12% fracture of distal radius.  We will contact hand surgery regarding appropriate definitive care.  Medical screening examination/treatment/procedure(s) were conducted as a shared visit with non-physician practitioner(s) and myself.  I personally evaluated the patient during the encounter   Daleen Bo, MD 05/27/16 1843

## 2016-05-27 NOTE — ED Triage Notes (Signed)
Pt reports tripping while taking out the trash, fell onto left wrist, pain and deformity noted. Pulse intact.

## 2016-05-31 DIAGNOSIS — S52552A Other extraarticular fracture of lower end of left radius, initial encounter for closed fracture: Secondary | ICD-10-CM | POA: Diagnosis not present

## 2016-06-08 DIAGNOSIS — G8918 Other acute postprocedural pain: Secondary | ICD-10-CM | POA: Diagnosis not present

## 2016-06-08 DIAGNOSIS — Y929 Unspecified place or not applicable: Secondary | ICD-10-CM | POA: Diagnosis not present

## 2016-06-08 DIAGNOSIS — Y939 Activity, unspecified: Secondary | ICD-10-CM | POA: Diagnosis not present

## 2016-06-08 DIAGNOSIS — Y999 Unspecified external cause status: Secondary | ICD-10-CM | POA: Diagnosis not present

## 2016-06-08 DIAGNOSIS — S52572A Other intraarticular fracture of lower end of left radius, initial encounter for closed fracture: Secondary | ICD-10-CM | POA: Diagnosis not present

## 2016-06-20 DIAGNOSIS — S52552D Other extraarticular fracture of lower end of left radius, subsequent encounter for closed fracture with routine healing: Secondary | ICD-10-CM | POA: Diagnosis not present

## 2016-07-04 DIAGNOSIS — S52552D Other extraarticular fracture of lower end of left radius, subsequent encounter for closed fracture with routine healing: Secondary | ICD-10-CM | POA: Diagnosis not present

## 2016-07-11 DIAGNOSIS — Z6841 Body Mass Index (BMI) 40.0 and over, adult: Secondary | ICD-10-CM | POA: Diagnosis not present

## 2016-07-11 DIAGNOSIS — E119 Type 2 diabetes mellitus without complications: Secondary | ICD-10-CM | POA: Diagnosis not present

## 2016-07-11 DIAGNOSIS — S6290XS Unspecified fracture of unspecified wrist and hand, sequela: Secondary | ICD-10-CM | POA: Diagnosis not present

## 2016-07-11 DIAGNOSIS — K5090 Crohn's disease, unspecified, without complications: Secondary | ICD-10-CM | POA: Diagnosis not present

## 2016-07-11 DIAGNOSIS — M13869 Other specified arthritis, unspecified knee: Secondary | ICD-10-CM | POA: Diagnosis not present

## 2016-07-11 DIAGNOSIS — M810 Age-related osteoporosis without current pathological fracture: Secondary | ICD-10-CM | POA: Diagnosis not present

## 2016-07-11 DIAGNOSIS — Z1389 Encounter for screening for other disorder: Secondary | ICD-10-CM | POA: Diagnosis not present

## 2016-07-11 DIAGNOSIS — K439 Ventral hernia without obstruction or gangrene: Secondary | ICD-10-CM | POA: Diagnosis not present

## 2016-07-11 DIAGNOSIS — I10 Essential (primary) hypertension: Secondary | ICD-10-CM | POA: Diagnosis not present

## 2016-07-12 DIAGNOSIS — S52552D Other extraarticular fracture of lower end of left radius, subsequent encounter for closed fracture with routine healing: Secondary | ICD-10-CM | POA: Diagnosis not present

## 2016-07-21 DIAGNOSIS — S52552D Other extraarticular fracture of lower end of left radius, subsequent encounter for closed fracture with routine healing: Secondary | ICD-10-CM | POA: Diagnosis not present

## 2016-07-29 DIAGNOSIS — S52552D Other extraarticular fracture of lower end of left radius, subsequent encounter for closed fracture with routine healing: Secondary | ICD-10-CM | POA: Diagnosis not present

## 2016-08-01 DIAGNOSIS — S52552D Other extraarticular fracture of lower end of left radius, subsequent encounter for closed fracture with routine healing: Secondary | ICD-10-CM | POA: Diagnosis not present

## 2016-08-17 ENCOUNTER — Other Ambulatory Visit: Payer: Self-pay | Admitting: Internal Medicine

## 2016-08-26 DIAGNOSIS — M810 Age-related osteoporosis without current pathological fracture: Secondary | ICD-10-CM | POA: Diagnosis not present

## 2016-08-26 DIAGNOSIS — E119 Type 2 diabetes mellitus without complications: Secondary | ICD-10-CM | POA: Diagnosis not present

## 2016-08-26 DIAGNOSIS — E784 Other hyperlipidemia: Secondary | ICD-10-CM | POA: Diagnosis not present

## 2016-08-26 DIAGNOSIS — I10 Essential (primary) hypertension: Secondary | ICD-10-CM | POA: Diagnosis not present

## 2016-08-30 DIAGNOSIS — S52552D Other extraarticular fracture of lower end of left radius, subsequent encounter for closed fracture with routine healing: Secondary | ICD-10-CM | POA: Diagnosis not present

## 2016-08-30 DIAGNOSIS — Z4789 Encounter for other orthopedic aftercare: Secondary | ICD-10-CM | POA: Diagnosis not present

## 2016-09-14 DIAGNOSIS — H401113 Primary open-angle glaucoma, right eye, severe stage: Secondary | ICD-10-CM | POA: Diagnosis not present

## 2016-09-14 DIAGNOSIS — H353131 Nonexudative age-related macular degeneration, bilateral, early dry stage: Secondary | ICD-10-CM | POA: Diagnosis not present

## 2016-09-14 DIAGNOSIS — Z961 Presence of intraocular lens: Secondary | ICD-10-CM | POA: Diagnosis not present

## 2016-09-14 DIAGNOSIS — H16141 Punctate keratitis, right eye: Secondary | ICD-10-CM | POA: Diagnosis not present

## 2016-09-15 DIAGNOSIS — M810 Age-related osteoporosis without current pathological fracture: Secondary | ICD-10-CM | POA: Diagnosis not present

## 2016-09-15 DIAGNOSIS — I10 Essential (primary) hypertension: Secondary | ICD-10-CM | POA: Diagnosis not present

## 2016-09-15 DIAGNOSIS — R748 Abnormal levels of other serum enzymes: Secondary | ICD-10-CM | POA: Diagnosis not present

## 2016-09-15 DIAGNOSIS — Z0000 Encounter for general adult medical examination without abnormal findings: Secondary | ICD-10-CM | POA: Diagnosis not present

## 2016-09-15 DIAGNOSIS — J449 Chronic obstructive pulmonary disease, unspecified: Secondary | ICD-10-CM | POA: Diagnosis not present

## 2016-09-15 DIAGNOSIS — D692 Other nonthrombocytopenic purpura: Secondary | ICD-10-CM | POA: Diagnosis not present

## 2016-09-15 DIAGNOSIS — E784 Other hyperlipidemia: Secondary | ICD-10-CM | POA: Diagnosis not present

## 2016-09-15 DIAGNOSIS — E119 Type 2 diabetes mellitus without complications: Secondary | ICD-10-CM | POA: Diagnosis not present

## 2016-09-15 DIAGNOSIS — K5090 Crohn's disease, unspecified, without complications: Secondary | ICD-10-CM | POA: Diagnosis not present

## 2016-09-15 DIAGNOSIS — I5030 Unspecified diastolic (congestive) heart failure: Secondary | ICD-10-CM | POA: Diagnosis not present

## 2016-09-15 DIAGNOSIS — Z6838 Body mass index (BMI) 38.0-38.9, adult: Secondary | ICD-10-CM | POA: Diagnosis not present

## 2016-10-10 DIAGNOSIS — R1900 Intra-abdominal and pelvic swelling, mass and lump, unspecified site: Secondary | ICD-10-CM | POA: Diagnosis not present

## 2016-10-11 ENCOUNTER — Other Ambulatory Visit: Payer: Self-pay | Admitting: Surgery

## 2016-10-11 DIAGNOSIS — R1900 Intra-abdominal and pelvic swelling, mass and lump, unspecified site: Secondary | ICD-10-CM

## 2016-10-26 DIAGNOSIS — Z1231 Encounter for screening mammogram for malignant neoplasm of breast: Secondary | ICD-10-CM | POA: Diagnosis not present

## 2016-11-09 ENCOUNTER — Ambulatory Visit
Admission: RE | Admit: 2016-11-09 | Discharge: 2016-11-09 | Disposition: A | Discharge status: Home / Self Care | Payer: PPO | Source: Ambulatory Visit | Attending: Surgery | Admitting: Surgery

## 2016-11-09 DIAGNOSIS — K8020 Calculus of gallbladder without cholecystitis without obstruction: Secondary | ICD-10-CM | POA: Diagnosis not present

## 2016-11-09 DIAGNOSIS — R1900 Intra-abdominal and pelvic swelling, mass and lump, unspecified site: Secondary | ICD-10-CM

## 2016-11-09 MED ORDER — IOPAMIDOL (ISOVUE-300) INJECTION 61%
125.0000 mL | Freq: Once | INTRAVENOUS | Status: AC | PRN
  Administered 2016-11-09: 125 mL via INTRAVENOUS

## 2016-11-10 ENCOUNTER — Ambulatory Visit: Payer: PPO | Admitting: Internal Medicine

## 2017-01-09 DIAGNOSIS — H16141 Punctate keratitis, right eye: Secondary | ICD-10-CM | POA: Diagnosis not present

## 2017-01-09 DIAGNOSIS — H353131 Nonexudative age-related macular degeneration, bilateral, early dry stage: Secondary | ICD-10-CM | POA: Diagnosis not present

## 2017-01-09 DIAGNOSIS — H401121 Primary open-angle glaucoma, left eye, mild stage: Secondary | ICD-10-CM | POA: Diagnosis not present

## 2017-01-09 DIAGNOSIS — Z961 Presence of intraocular lens: Secondary | ICD-10-CM | POA: Diagnosis not present

## 2017-01-09 DIAGNOSIS — H401113 Primary open-angle glaucoma, right eye, severe stage: Secondary | ICD-10-CM | POA: Diagnosis not present

## 2017-01-09 DIAGNOSIS — E119 Type 2 diabetes mellitus without complications: Secondary | ICD-10-CM | POA: Diagnosis not present

## 2017-01-16 DIAGNOSIS — R748 Abnormal levels of other serum enzymes: Secondary | ICD-10-CM | POA: Diagnosis not present

## 2017-01-16 DIAGNOSIS — K5090 Crohn's disease, unspecified, without complications: Secondary | ICD-10-CM | POA: Diagnosis not present

## 2017-01-16 DIAGNOSIS — I10 Essential (primary) hypertension: Secondary | ICD-10-CM | POA: Diagnosis not present

## 2017-01-16 DIAGNOSIS — Z6836 Body mass index (BMI) 36.0-36.9, adult: Secondary | ICD-10-CM | POA: Diagnosis not present

## 2017-01-16 DIAGNOSIS — Z23 Encounter for immunization: Secondary | ICD-10-CM | POA: Diagnosis not present

## 2017-01-16 DIAGNOSIS — E119 Type 2 diabetes mellitus without complications: Secondary | ICD-10-CM | POA: Diagnosis not present

## 2017-01-31 DIAGNOSIS — H02135 Senile ectropion of left lower eyelid: Secondary | ICD-10-CM | POA: Diagnosis not present

## 2017-01-31 DIAGNOSIS — H10523 Angular blepharoconjunctivitis, bilateral: Secondary | ICD-10-CM | POA: Diagnosis not present

## 2017-01-31 DIAGNOSIS — Z961 Presence of intraocular lens: Secondary | ICD-10-CM | POA: Diagnosis not present

## 2017-01-31 DIAGNOSIS — H401121 Primary open-angle glaucoma, left eye, mild stage: Secondary | ICD-10-CM | POA: Diagnosis not present

## 2017-01-31 DIAGNOSIS — H16141 Punctate keratitis, right eye: Secondary | ICD-10-CM | POA: Diagnosis not present

## 2017-01-31 DIAGNOSIS — H02132 Senile ectropion of right lower eyelid: Secondary | ICD-10-CM | POA: Diagnosis not present

## 2017-01-31 DIAGNOSIS — H401113 Primary open-angle glaucoma, right eye, severe stage: Secondary | ICD-10-CM | POA: Diagnosis not present

## 2017-04-28 DIAGNOSIS — J111 Influenza due to unidentified influenza virus with other respiratory manifestations: Secondary | ICD-10-CM | POA: Diagnosis not present

## 2017-04-28 DIAGNOSIS — R0609 Other forms of dyspnea: Secondary | ICD-10-CM | POA: Diagnosis not present

## 2017-04-28 DIAGNOSIS — I10 Essential (primary) hypertension: Secondary | ICD-10-CM | POA: Diagnosis not present

## 2017-04-28 DIAGNOSIS — Z6836 Body mass index (BMI) 36.0-36.9, adult: Secondary | ICD-10-CM | POA: Diagnosis not present

## 2017-04-28 DIAGNOSIS — J449 Chronic obstructive pulmonary disease, unspecified: Secondary | ICD-10-CM | POA: Diagnosis not present

## 2017-05-22 ENCOUNTER — Telehealth: Payer: Self-pay | Admitting: Internal Medicine

## 2017-05-22 MED ORDER — FLUTICASONE FUROATE 100 MCG/ACT IN AEPB: 1.0000 | INHALATION_SPRAY | Freq: Every day | RESPIRATORY_TRACT | 0 refills | Status: DC

## 2017-05-22 NOTE — Telephone Encounter (Signed)
Spoke with patient, she was requesting a refill on her Arnuity to be sent to [facility]. Advised patient that she has not been seen since 2017. She has been scheduled with MR for 06/21/17 at 1115.   Also advised patient that I would send a one time 3 month supply to last until her appt. Patient verbalized understanding. Nothing else needed at time of call.

## 2017-06-06 DIAGNOSIS — H401121 Primary open-angle glaucoma, left eye, mild stage: Secondary | ICD-10-CM | POA: Diagnosis not present

## 2017-06-06 DIAGNOSIS — H02132 Senile ectropion of right lower eyelid: Secondary | ICD-10-CM | POA: Diagnosis not present

## 2017-06-06 DIAGNOSIS — Z961 Presence of intraocular lens: Secondary | ICD-10-CM | POA: Diagnosis not present

## 2017-06-06 DIAGNOSIS — H02135 Senile ectropion of left lower eyelid: Secondary | ICD-10-CM | POA: Diagnosis not present

## 2017-06-06 DIAGNOSIS — H16141 Punctate keratitis, right eye: Secondary | ICD-10-CM | POA: Diagnosis not present

## 2017-06-06 DIAGNOSIS — H401113 Primary open-angle glaucoma, right eye, severe stage: Secondary | ICD-10-CM | POA: Diagnosis not present

## 2017-06-06 DIAGNOSIS — H353133 Nonexudative age-related macular degeneration, bilateral, advanced atrophic without subfoveal involvement: Secondary | ICD-10-CM | POA: Diagnosis not present

## 2017-06-06 DIAGNOSIS — H10523 Angular blepharoconjunctivitis, bilateral: Secondary | ICD-10-CM | POA: Diagnosis not present

## 2017-06-07 DIAGNOSIS — H547 Unspecified visual loss: Secondary | ICD-10-CM | POA: Diagnosis not present

## 2017-06-07 DIAGNOSIS — Z6837 Body mass index (BMI) 37.0-37.9, adult: Secondary | ICD-10-CM | POA: Diagnosis not present

## 2017-06-07 DIAGNOSIS — H9190 Unspecified hearing loss, unspecified ear: Secondary | ICD-10-CM | POA: Diagnosis not present

## 2017-06-07 DIAGNOSIS — E1169 Type 2 diabetes mellitus with other specified complication: Secondary | ICD-10-CM | POA: Diagnosis not present

## 2017-06-07 DIAGNOSIS — I10 Essential (primary) hypertension: Secondary | ICD-10-CM | POA: Diagnosis not present

## 2017-06-17 IMAGING — CT CT CHEST W/O CM
2 of 3 series · 15 of 36 positions shown, 18 images · non-contrast
Comparison: 06/08/2015

CLINICAL DATA: Routine follow-up of pulmonary nodule

EXAM:
CT CHEST WITHOUT CONTRAST
TECHNIQUE: Multidetector CT imaging of the chest was performed following the
standard protocol without IV contrast.

[Series 2: thorax · axial · 0.84mm/px · z∈[-306,-58]mm · 12 of 146 slices shown, 15 images]
[im 11/146  mediastinal]
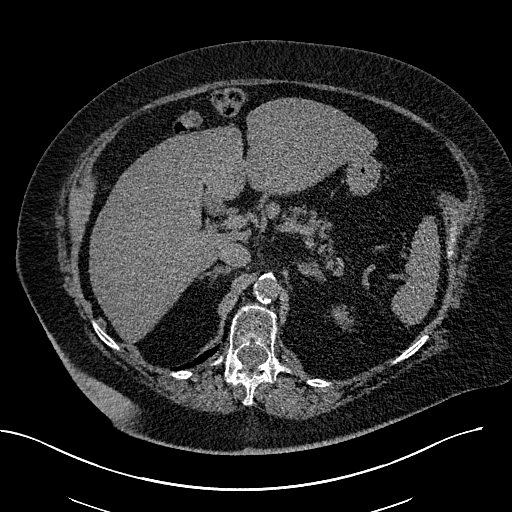
[im 11/146  lung]
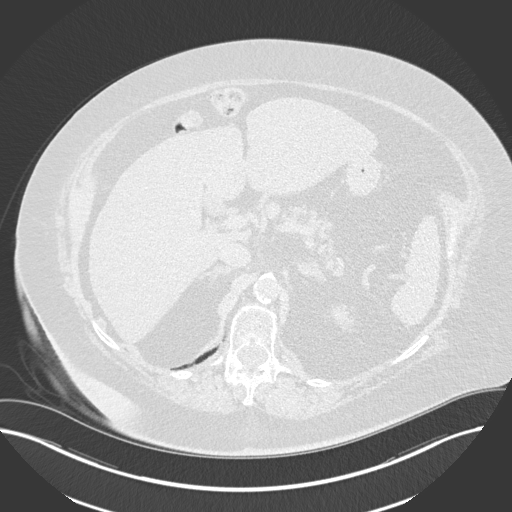
[im 22/146  lung]
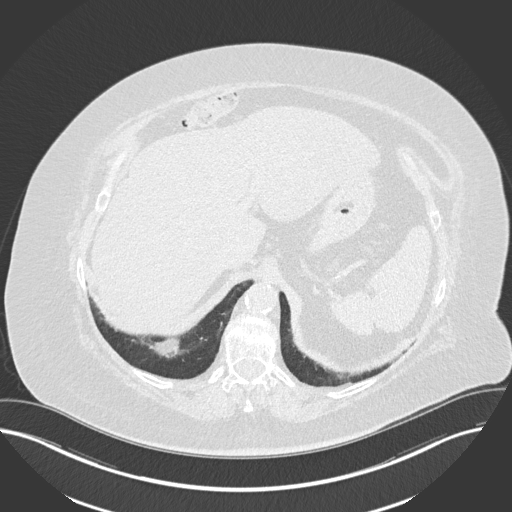
[im 33/146  lung]
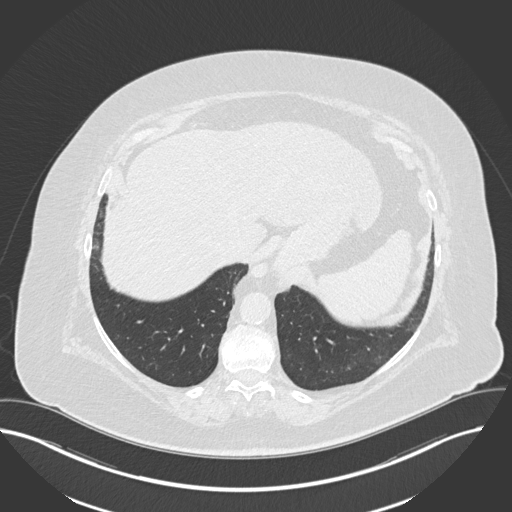
[im 43/146  lung]
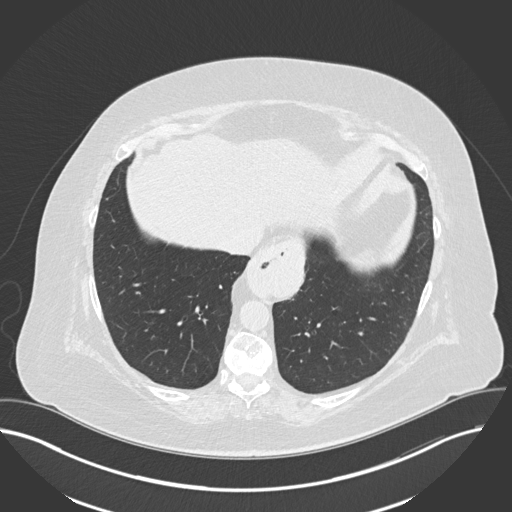
[im 54/146  mediastinal]
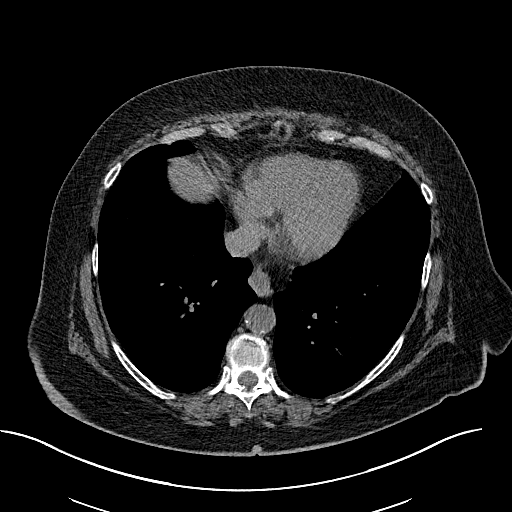
[im 54/146  lung]
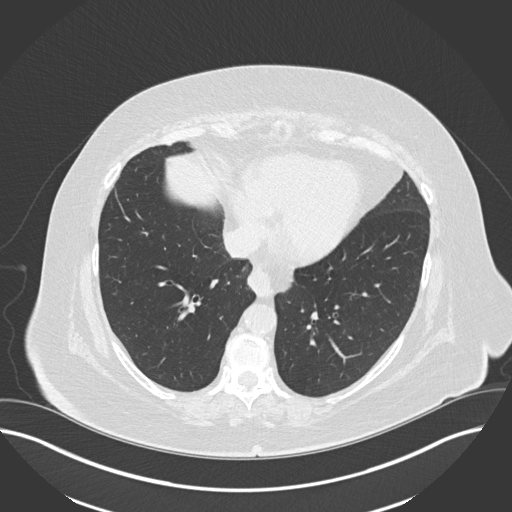
[im 65/146  lung]
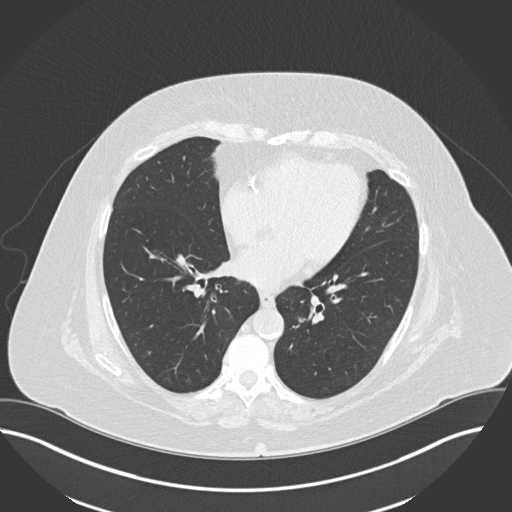
[im 81/146  lung]
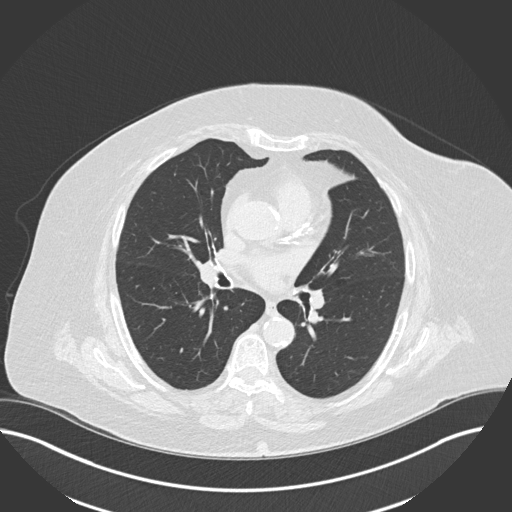
[im 92/146  lung]
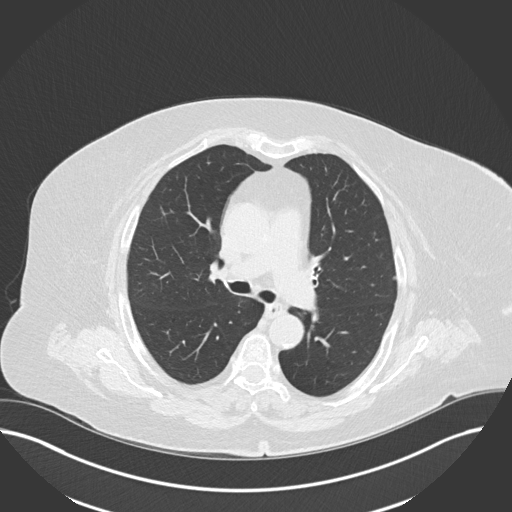
[im 103/146  mediastinal]
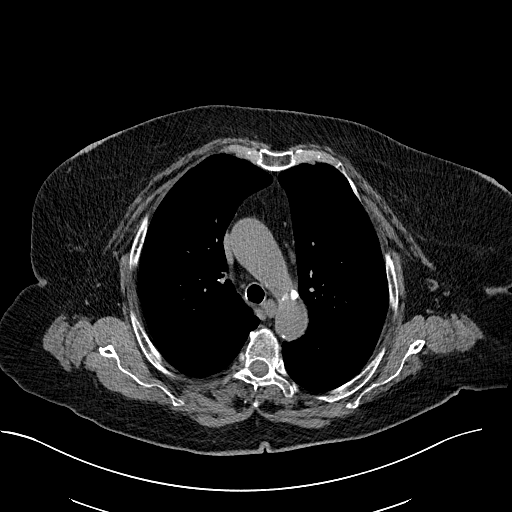
[im 103/146  lung]
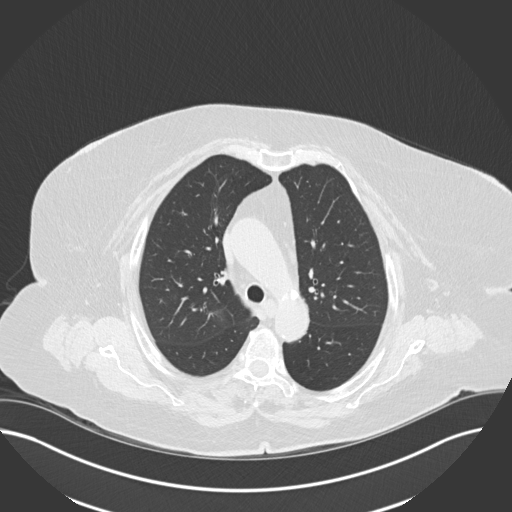
[im 113/146  lung]
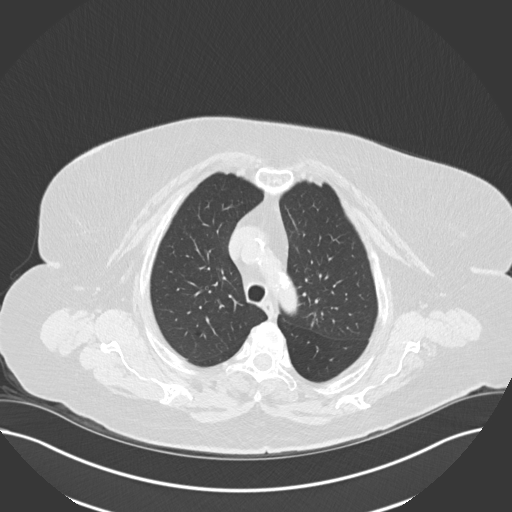
[im 124/146  lung]
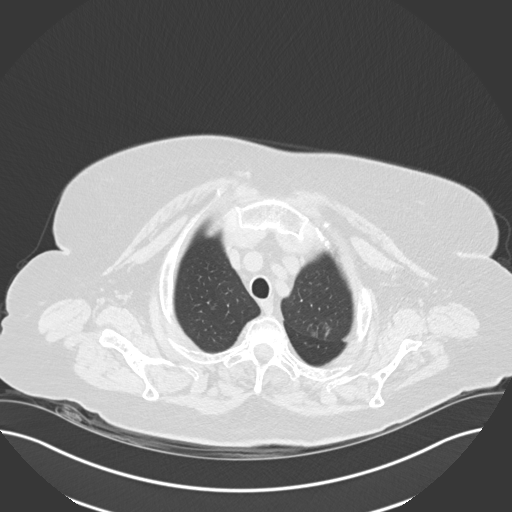
[im 135/146  lung]
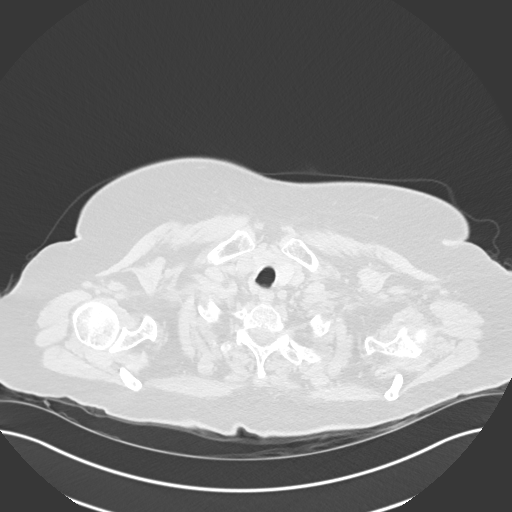

[Series 5: coronal · coronal · 0.62mm/px · 3 of 148 slices shown]
[im 30/148  lung]
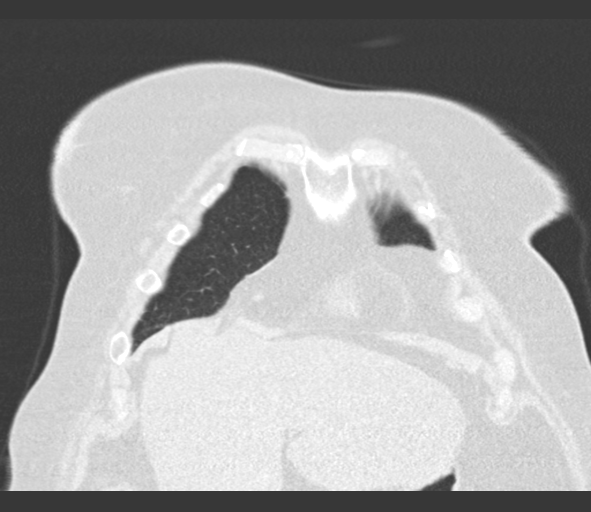
[im 59/148  lung]
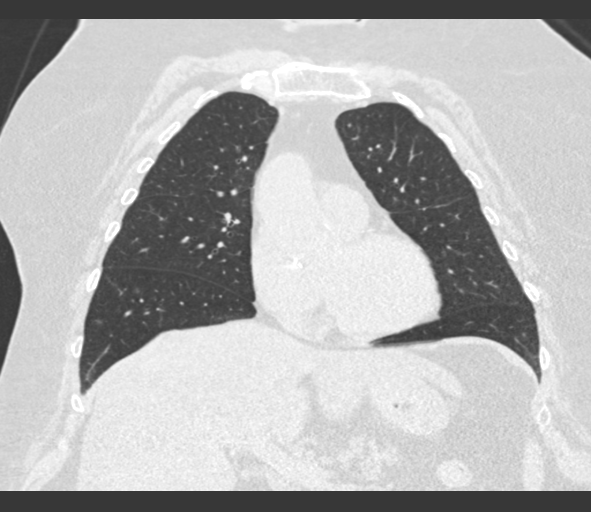
[im 89/148  lung]
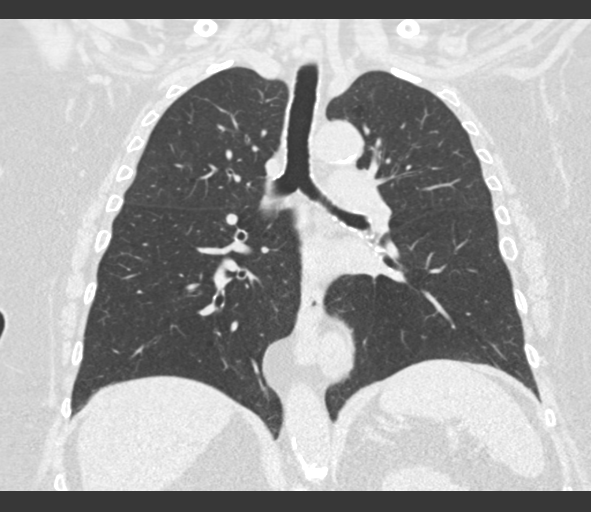

[15 of 36 positions shown; findings below may reference images not displayed]

FINDINGS: Cardiovascular: The heart size is normal. No pericardial effusion.
Aortic atherosclerosis identified. Calcifications within the RCA,
LAD and left circumflex coronary arteries noted.

Mediastinum/Nodes: No enlarged mediastinal or axillary lymph nodes.
Thyroid gland, trachea, and esophagus demonstrate no significant
findings.

Lungs/Pleura: No pleural effusion. No airspace consolidation
identified. Within the left upper lobe there is a part solid nodule.
This measures 1.9 cm and has a central solid component measuring 7
mm. Unchanged when compared with the study from 06/08/2015. However,
in 7777 this nodule measured 11 mm and did not have a internal solid
component. On 0143 study this measured 1.8 cm. The internal solid
component measured 6 mm. There is a pure ground-glass attenuating
nodule within the posterior right upper lobe which measures 2.6 cm,
image number 40 of series 2. Tiny subpleural nodule within the left
upper lobe measures 5 mm, image 55 of series 2.

Upper Abdomen: The adrenal glands are normal. Stones are identified
within the gallbladder. There is mild diffuse hepatic steatosis.
Small to moderate size hiatal hernia is identified.

Musculoskeletal: No aggressive lytic or sclerotic bone lesions
identified.
IMPRESSION: 1. Persistent part solid nodule is identified within the left upper
lobe with an central solid component measuring 7 mm. According to
consensus guidelines: If persistent these nodules should be
considered highly suspicious if the solid component of the nodule is
6 mm or greater in size and enlarging. This recommendation follows
the consensus statement: Guidelines for Management of Incidental
Pulmonary Nodules Detected on CT Images:From the [HOSPITAL]
0501; published online before print (10.1148/radiol.5015161605).
2. Stable ground-glass nodule within the posterior right upper lobe.
Management of this nodule: repeat CT is recommended every 2 years
until 5 years of stability has been established. This recommendation
follows the consensus statement: Guidelines for Management of
Incidental Pulmonary Nodules Detected on CT Images: From the

## 2017-06-21 ENCOUNTER — Ambulatory Visit: Payer: PPO | Admitting: Internal Medicine

## 2017-06-21 ENCOUNTER — Encounter: Payer: Self-pay | Admitting: Internal Medicine

## 2017-06-21 VITALS — BP 118/64 | HR 69 | Ht 65.0 in | Wt 227.6 lb

## 2017-06-21 DIAGNOSIS — R911 Solitary pulmonary nodule: Secondary | ICD-10-CM

## 2017-06-21 DIAGNOSIS — J45991 Cough variant asthma: Secondary | ICD-10-CM | POA: Diagnosis not present
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
## 2017-06-21 NOTE — Patient Instructions (Signed)
Cough variant asthma  - stable and well controlled - continue arnuity daily with albuterol as needed  Lung nodule  - your last CT was oct 2017 and in that on the Left Upper Lobe nodule there was changes compraed to scan years earlier. So,   - do CT chest without contrast next few weeks  Followup  - next few weeks with myself or APP SArah to discuss CT results

## 2017-07-04 ENCOUNTER — Ambulatory Visit (INDEPENDENT_AMBULATORY_CARE_PROVIDER_SITE_OTHER)
Admission: RE | Admit: 2017-07-04 | Discharge: 2017-07-04 | Disposition: A | Discharge status: Home / Self Care | Payer: PPO | Source: Ambulatory Visit | Attending: Internal Medicine | Admitting: Internal Medicine

## 2017-07-04 DIAGNOSIS — R911 Solitary pulmonary nodule: Secondary | ICD-10-CM

## 2017-07-18 ENCOUNTER — Telehealth: Payer: Self-pay | Admitting: Internal Medicine

## 2017-07-18 DIAGNOSIS — H6982 Other specified disorders of Eustachian tube, left ear: Secondary | ICD-10-CM | POA: Diagnosis not present

## 2017-07-18 DIAGNOSIS — H903 Sensorineural hearing loss, bilateral: Secondary | ICD-10-CM | POA: Diagnosis not present

## 2017-07-18 DIAGNOSIS — R911 Solitary pulmonary nodule: Secondary | ICD-10-CM

## 2017-07-18 DIAGNOSIS — R918 Other nonspecific abnormal finding of lung field: Secondary | ICD-10-CM

## 2017-07-18 NOTE — Telephone Encounter (Signed)
   Ashley Parker  No followup for Ms Huertas exists after her CT. CT shows the below and let her know that  Plan - get Super D of CT 07/04/17 0- have her do PET scan (had one in 2016) and come for followup  Thanks  ,Dr. Brand Males, [name], [facility].C.P Pulmonary and Critical Care Medicine Staff Physician, [place] Director - Interstitial Lung Disease  Program  Pulmonary [place] at [place], [place], [address]  Pager: [phone], If no answer or between  15:00h - 7:00h: call [phone] Telephone: [phone]       IMPRESSION: 1. Interval development of an 8 x 12 mm irregular soft tissue nodule within the right right upper lobe ground-glass nodule seen previously. Imaging features highly concerning for neoplasm. PET-CT may prove helpful to further evaluate. 2. Posterior left upper lobe lesion has progressed in the interval with solid soft tissue nodular component now measuring 10 mm. Lesion highly concerning for neoplasm. PET-CT could also be used to assess this lesion further. 3. No evidence for lymphadenopathy in the chest. 4. Cholelithiasis 5.  Aortic Atherosclerois (ICD10-170.0)   Electronically Signed   By: Misty Stanley [name]   On: 07/04/2017 16:38

## 2017-07-26 NOTE — Telephone Encounter (Signed)
Ok please have her do PET and come for followup with me or [place]; just let me know date  Thanks  Dr. Brand Males, [name], [facility].C.P Pulmonary and Critical Care Medicine Staff Physician, [place] Director - Interstitial Lung Disease  Program  Pulmonary [place] at [place], [place], [address]  Pager: [phone], If no answer or between  15:00h - 7:00h: call [phone] Telephone: [phone]

## 2017-07-26 NOTE — Telephone Encounter (Signed)
Stacy at [facility] CT called back, we cannot get a Super D from the 5/7 CT.    MR please advise on how you'd like to proceed.

## 2017-07-26 NOTE — Telephone Encounter (Signed)
Typically we cannot obtain a Super D 3 weeks post imaging. LMTCB for [place] CT to see if this is attainable.  Will await call back, as this may change pt's plan of care.

## 2017-07-28 ENCOUNTER — Other Ambulatory Visit: Payer: Self-pay | Admitting: Internal Medicine

## 2017-07-28 DIAGNOSIS — R911 Solitary pulmonary nodule: Secondary | ICD-10-CM

## 2017-07-28 DIAGNOSIS — R918 Other nonspecific abnormal finding of lung field: Secondary | ICD-10-CM

## 2017-07-28 NOTE — Telephone Encounter (Signed)
Patient is calling as she has not gotten CT results from a few weeks ago.  Call back is  [phone] or [phone].

## 2017-07-28 NOTE — Telephone Encounter (Signed)
lmtcb for pt.  

## 2017-07-28 NOTE — Telephone Encounter (Signed)
Spoke with the pt and notified of of her CT results  She verbalized understanding of these and agrees to PET scan  I have placed order for this to be done STAT and also in the comments noted that pt will need ov with MR or RB after   Pt is very upset that she had to call herself to obtain the results  I explained to her that there was a miscommunication, and apologized for this extensively  She wishes to speak with management regarding this issue   Will forward to [place]

## 2017-08-01 ENCOUNTER — Ambulatory Visit (HOSPITAL_COMMUNITY): Payer: PPO

## 2017-08-02 ENCOUNTER — Encounter (HOSPITAL_COMMUNITY)
Admission: RE | Admit: 2017-08-02 | Discharge: 2017-08-02 | Disposition: A | Discharge status: Home / Self Care | Payer: PPO | Source: Ambulatory Visit | Attending: Internal Medicine | Admitting: Internal Medicine

## 2017-08-02 ENCOUNTER — Ambulatory Visit: Payer: PPO | Admitting: Internal Medicine

## 2017-08-02 DIAGNOSIS — R918 Other nonspecific abnormal finding of lung field: Secondary | ICD-10-CM | POA: Diagnosis not present

## 2017-08-02 DIAGNOSIS — R911 Solitary pulmonary nodule: Secondary | ICD-10-CM | POA: Insufficient documentation

## 2017-08-02 LAB — GLUCOSE, CAPILLARY: Glucose-Capillary: 161 mg/dL — ABNORMAL HIGH (ref 65–99)

## 2017-08-02 MED ORDER — FLUDEOXYGLUCOSE F - 18 (FDG) INJECTION
11.8000 | Freq: Once | INTRAVENOUS | Status: AC | PRN
  Administered 2017-08-02: 11.8 via INTRAVENOUS

## 2017-08-03 NOTE — Telephone Encounter (Signed)
  Ashley Parker: please call patient and let her know that the findings are suspicisou for slow growing cancer  Sarah: Rob had  eNB on this patient some years ago - non-diagnostic and so this needs to be reconsidered. Please coordinatoe with Rob. You are seeing her 08/09/17  Thanks  Dr. Brand Males, [name], [facility].C.P Pulmonary and Critical Care Medicine Staff Physician, [place] Director - Interstitial Lung Disease  Program  Pulmonary [place] [facility] [place], [place], [address]  Pager: [phone], If no answer or between  15:00h - 7:00h: call [phone] Telephone: [phone]     Nm Pet Image Restag (ps) Skull Base To Thigh  Result Date: 08/02/2017 CLINICAL DATA:  Initial treatment strategy for new right upper lobe pulmonary nodule. Progressive left upper lobe part solid nodule. EXAM: NUCLEAR MEDICINE PET SKULL BASE TO THIGH TECHNIQUE: 12.6 mCi F-18 FDG was injected intravenously. Full-ring PET imaging was performed from the skull base to thigh after the radiotracer. CT data was obtained and used for attenuation correction and anatomic localization. Fasting blood glucose: 161 mg/dl COMPARISON:  PET-CT 12/09/2014.  Chest CT 12/10/2015 and 07/04/2017. FINDINGS: Mediastinal blood pool activity: SUV max 3.1 NECK: No hypermetabolic cervical lymph nodes are identified.There are no lesions of the pharyngeal mucosal space. Incidental CT findings: Bilateral carotid atherosclerosis with a possible right-sided stent. CHEST: There are no hypermetabolic mediastinal, hilar or axillary lymph nodes. The solid 9 x 10 mm right upper lobe nodule on image 18/8 is mildly hypermetabolic with an SUV max of 3.1. The sub solid left upper lobe lesion measures 19 x 23 mm on image 11/8 and has a solid component posteriorly measuring up to 12 mm in diameter. This lesion is mildly hypermetabolic with an SUV max 2.3. The hypermetabolic activity is similar to the previous  PET-CT. No other suspicious pulmonary nodules. Incidental CT findings: Atherosclerosis of the aorta, great vessels and coronary arteries. Probable calcifications of the aortic valve. There is a moderate size hiatal hernia. ABDOMEN/PELVIS: There is no hypermetabolic activity within the liver, adrenal glands, spleen or pancreas. There is no hypermetabolic nodal activity. Incidental CT findings: There is diffuse hepatic steatosis. Multiple nitrogen containing gallstones are present. Right renal cyst, diverticular changes of the sigmoid colon, aortic and branch vessel atherosclerosis and a periumbilical hernia containing only fat are noted. SKELETON: There is no hypermetabolic activity to suggest osseous metastatic disease. Probable mild left shoulder bursal hypermetabolic activity. Incidental CT findings: none IMPRESSION: 1. The new solid right upper lobe nodule is mildly hypermetabolic, suspicious for adenocarcinoma. 2. The slowly enlarging solid component within the part solid left upper lobe lesion is also mildly hypermetabolic, similar in intensity to 2016 PET-CT. The progression of the solid component is suspicious for adenocarcinoma as well. 3. No evidence of metastatic disease or other suspicious findings. 4. Incidental findings including diffuse atherosclerosis, hepatic steatosis and cholelithiasis. Electronically Signed   By: Richardean Sale [name]   On: 08/02/2017 17:27

## 2017-08-04 NOTE — Telephone Encounter (Signed)
Will do. Thanks

## 2017-08-04 NOTE — Telephone Encounter (Signed)
Attempted to call pt again but unable to reach her.  Left message for pt to return call.

## 2017-08-04 NOTE — Telephone Encounter (Signed)
Attempted to call pt on preferred number and was unable to reach her and was unable to leave a message due to no machine picking up.  Attempted to call pt on cell phone but unable to reach her on that number and when trying to leave her a message, the call was ended.  Will try to call back later.

## 2017-08-08 NOTE — Telephone Encounter (Signed)
Ashley Parker seenig her 08/09/17 and d/w Judson Roch

## 2017-08-09 ENCOUNTER — Ambulatory Visit (INDEPENDENT_AMBULATORY_CARE_PROVIDER_SITE_OTHER): Payer: PPO | Admitting: Acute Care

## 2017-08-09 ENCOUNTER — Encounter: Payer: Self-pay | Admitting: Acute Care

## 2017-08-09 ENCOUNTER — Telehealth: Payer: Self-pay | Admitting: Internal Medicine

## 2017-08-09 ENCOUNTER — Telehealth: Payer: Self-pay

## 2017-08-09 DIAGNOSIS — J45991 Cough variant asthma: Secondary | ICD-10-CM

## 2017-08-09 DIAGNOSIS — R911 Solitary pulmonary nodule: Secondary | ICD-10-CM

## 2017-08-09 NOTE — Progress Notes (Signed)
History of Present Illness Ashley Parker is a 84 y.o. female former heavy smoker with chronic cough, asthma and pulmonary nodule suspicious for adenocarcinoma.    08/09/2017 Pt. Present for follow up and for results of her PET scan. She was seen by Dr. Chase Caller. There was new nodule noted. PET scan was ordered, which shows mildly hypermetabolic uptake.  We discussed at length that this new nodule needs to be biopsied.  I explained to her that Dr. Chase Caller is concerned that it may be a slow-growing adenocarcinoma. Patient states she has not had any weight loss, no hemoptysis, no worsening shortness of breath.  She is more concerned today about the fact that her glaucoma is getting worse.  She thought that inhaled corticosteroids would make her glaucoma worse, therefore she has stopped taking her Flonase, and her albuterol respite stick.  She states she has been taking her Arnuity, but has been concerned about the inhaled corticosteroid and that medication also.  She states she would like to come off the Arnuity, as she fears it is causing the worsening of her glaucoma.  I explained to her that the real concern with glaucoma in regard to inhalers is with anticholinergics.  I reassured her that she is not currently on any anticholinergics.  She is willing to continue the [place] for now, she will follow-up with her eye doctor for pressure recheck.  I have encouraged her to utilize both her Flonase and her albuterol rescue as prescribed. Patient denies fever, chest pain, orthopnea, or hemoptysis.    Test Results: PET/CT June 2019 The new solid right upper lobe nodule is mildly hypermetabolic, suspicious for adenocarcinoma. 2. The slowly enlarging solid component within the part solid left upper lobe lesion is also mildly hypermetabolic, similar in intensity to 2016 PET-CT. The progression of the solid component is suspicious for adenocarcinoma as well. 3. No evidence of metastatic disease or  other suspicious findings.   CT chest without contrast May 2019 Interval development of an 8 x 12 mm irregular soft tissue nodule within the right right upper lobe ground-glass nodule seen previously. Imaging features highly concerning for neoplasm. PET-CT may prove helpful to further evaluate. 2. Posterior left upper lobe lesion has progressed in the interval with solid soft tissue nodular component now measuring 10 mm. Lesion highly concerning for neoplasm. PET-CT could also be used to assess this lesion further. 3. No evidence for lymphadenopathy in the chest. 4. Cholelithiasis 5.  Aortic Atherosclerois (ICD10-170.0)  CBC Latest Ref Rng & Units 06/08/2015 05/08/2015 02/20/2014  WBC 4.0 - 10.5 K/uL 10.2 11.3(H) 11.7(H)  Hemoglobin 12.0 - 15.0 g/dL 13.8 14.5 12.2  Hematocrit 36.0 - 46.0 % 42.3 45.4 37.9  Platelets 150 - 400 K/uL 303 320 253    BMP Latest Ref Rng & Units 06/08/2015 05/08/2015 02/18/2014  Glucose 65 - 99 mg/dL 120(H) 101(H) 130(H)  BUN 6 - 20 mg/dL 13 24(H) 11  Creatinine 0.44 - 1.00 mg/dL 0.68 0.76 0.70  Sodium 135 - 145 mmol/L 141 137 137  Potassium 3.5 - 5.1 mmol/L 3.8 4.2 3.7  Chloride 101 - 111 mmol/L 106 106 105  CO2 22 - 32 mmol/L 22 24 25   Calcium 8.9 - 10.3 mg/dL 9.2 8.8(L) 9.5    BNP No results found for: BNP  ProBNP No results found for: PROBNP  PFT    Component Value Date/Time   FEV1PRE 1.89 07/07/2014 1052   FEV1POST 2.09 07/07/2014 1052   FVCPRE 2.96 07/07/2014 1052   FVCPOST  2.97 07/07/2014 1052   TLC 5.37 07/07/2014 1052   DLCOUNC 17.20 07/07/2014 1052   PREFEV1FVCRT 64 07/07/2014 1052   PSTFEV1FVCRT 70 07/07/2014 1052    Nm Pet Image Restag (ps) Skull Base To Thigh  Result Date: 08/02/2017 CLINICAL DATA:  Initial treatment strategy for new right upper lobe pulmonary nodule. Progressive left upper lobe part solid nodule. EXAM: NUCLEAR MEDICINE PET SKULL BASE TO THIGH TECHNIQUE: 12.6 mCi F-18 FDG was injected intravenously. Full-ring  PET imaging was performed from the skull base to thigh after the radiotracer. CT data was obtained and used for attenuation correction and anatomic localization. Fasting blood glucose: 161 mg/dl COMPARISON:  PET-CT 12/09/2014.  Chest CT 12/10/2015 and 07/04/2017. FINDINGS: Mediastinal blood pool activity: SUV max 3.1 NECK: No hypermetabolic cervical lymph nodes are identified.There are no lesions of the pharyngeal mucosal space. Incidental CT findings: Bilateral carotid atherosclerosis with a possible right-sided stent. CHEST: There are no hypermetabolic mediastinal, hilar or axillary lymph nodes. The solid 9 x 10 mm right upper lobe nodule on image 18/8 is mildly hypermetabolic with an SUV max of 3.1. The sub solid left upper lobe lesion measures 19 x 23 mm on image 11/8 and has a solid component posteriorly measuring up to 12 mm in diameter. This lesion is mildly hypermetabolic with an SUV max 2.3. The hypermetabolic activity is similar to the previous PET-CT. No other suspicious pulmonary nodules. Incidental CT findings: Atherosclerosis of the aorta, great vessels and coronary arteries. Probable calcifications of the aortic valve. There is a moderate size hiatal hernia. ABDOMEN/PELVIS: There is no hypermetabolic activity within the liver, adrenal glands, spleen or pancreas. There is no hypermetabolic nodal activity. Incidental CT findings: There is diffuse hepatic steatosis. Multiple nitrogen containing gallstones are present. Right renal cyst, diverticular changes of the sigmoid colon, aortic and branch vessel atherosclerosis and a periumbilical hernia containing only fat are noted. SKELETON: There is no hypermetabolic activity to suggest osseous metastatic disease. Probable mild left shoulder bursal hypermetabolic activity. Incidental CT findings: none IMPRESSION: 1. The new solid right upper lobe nodule is mildly hypermetabolic, suspicious for adenocarcinoma. 2. The slowly enlarging solid component within the  part solid left upper lobe lesion is also mildly hypermetabolic, similar in intensity to 2016 PET-CT. The progression of the solid component is suspicious for adenocarcinoma as well. 3. No evidence of metastatic disease or other suspicious findings. 4. Incidental findings including diffuse atherosclerosis, hepatic steatosis and cholelithiasis. Electronically Signed   By: Richardean Sale [name]   On: 08/02/2017 17:27     Past medical hx Past Medical History:  Diagnosis Date  . Arthritis   . Cough variant asthma 01/20/2015  . Crohn's ileitis ([place])    Mild, mostly asymptomatic, not on therapy  . Fundic gland polyposis of stomach   . GERD (gastroesophageal reflux disease)   . Glaucoma   . Hemorrhoids    Internal and External  . Hypertension   . Macular degeneration   . Obesity, unspecified   . Osteoporosis   . Pelvic fracture ([place]) 2007  . Stricture of esophagus    distal  . Unspecified vitamin D deficiency   . Wrist fracture, left 1943, 1985     Social History   Tobacco Use  . Smoking status: Former Smoker    Packs/day: 1.00    Years: 32.00    Pack years: 32.00    Types: Cigarettes    Last attempt to quit: 02/19/1984    Years since quitting: 33.4  . Smokeless tobacco: Never  Used  Substance Use Topics  . Alcohol use: Yes    Alcohol/week: 0.0 oz    Comment: 1 drink daily   . Drug use: No    Ashley Parker reports that she quit smoking about 33 years ago. Her smoking use included cigarettes. She has a 32.00 pack-year smoking history. She has never used smokeless tobacco. She reports that she drinks alcohol. She reports that she does not use drugs.  Tobacco Cessation: Former smoker quit 1985 with a 32-pack-year smoking history  Past surgical hx, Family hx, Social hx all reviewed.  Current Outpatient Medications on File Prior to Visit  Medication Sig  . Albuterol Sulfate (PROAIR RESPICLICK IN) Inhale 1-2 puffs into the lungs daily.  [name] b complex vitamins tablet Take 1 tablet by  mouth daily.  . Calcium Carbonate (CALCIUM 600 PO) Take 1 tablet by mouth 3 (three) times daily.  . Calcium Carbonate Antacid (ANTACID PO) Take 1 tablet by mouth 2 (two) times daily.  . Cholecalciferol (VITAMIN D3) 5000 units CAPS Take 5,000 Units by mouth.  . cyanocobalamin 1000 MCG tablet Take 1,000 mcg by mouth daily.  . dorzolamide (TRUSOPT) 2 % ophthalmic solution INSTILL 1 DROP INTO BOTH EYES 3 TIMES A DAY  . EPINEPHrine 0.3 mg/0.3 mL IJ SOAJ injection Inject 0.3 mg into the muscle as needed (for allergic reaction).  . Flaxseed, Linseed, (FLAXSEED OIL PO) Take 1 tablet by mouth daily.  . fluticasone (FLONASE) 50 MCG/ACT nasal spray Place 1 spray into both nostrils daily.   . Fluticasone Furoate (ARNUITY ELLIPTA) 100 MCG/ACT AEPB Take 1 puff by mouth daily.  . irbesartan (AVAPRO) 300 MG tablet Take 300 mg by mouth daily.   [name] LUMIGAN 0.01 % SOLN Place 1 drop into both eyes at bedtime.  . Magnesium 400 MG TABS Take 400 mg by mouth.  . Multiple Vitamins-Minerals (ICAPS AREDS 2 PO) Take 4 capsules by mouth daily.  . Omega-3 Fatty Acids (FISH OIL) 1200 MG CAPS Take 1,200 mg by mouth.  . timolol (TIMOPTIC) 0.5 % ophthalmic solution INSTILL 1 DROP INTO BOTH EYES IN THE MORNING  . Turmeric 450 MG CAPS Take 450 mg by mouth.  . vitamin C (ASCORBIC ACID) 500 MG tablet Take 500 mg by mouth daily.  . vitamin E 400 UNIT capsule Take 400 Units by mouth daily.     No current facility-administered medications on file prior to visit.      Allergies  Allergen Reactions  . Wasp Venom Anaphylaxis and Other (See Comments)    Severe pain in lower stomach, sweatiness, and BP drops low   . Biaxin [Clarithromycin] Other (See Comments)    achy   . Brimonidine Other (See Comments)    redness    Review Of Systems:  Constitutional:   No  weight loss, night sweats,  Fevers, chills, fatigue, or  lassitude.  HEENT:   No headaches,  Difficulty swallowing,  Tooth/dental problems, or  Sore throat,                 No sneezing, itching, ear ache, nasal congestion, post nasal drip,   CV:  No chest pain,  Orthopnea, PND, swelling in lower extremities, anasarca, dizziness, palpitations, syncope.   GI  No heartburn, indigestion, abdominal pain, nausea, vomiting, diarrhea, change in bowel habits, loss of appetite, bloody stools.   Resp: Rare shortness of breath with exertion or at rest.  No excess mucus, no productive cough,  No non-productive cough,  No coughing up of blood.  No  change in color of mucus.  No wheezing.  No chest wall deformity  Skin: no rash or lesions.  GU: no dysuria, change in color of urine, no urgency or frequency.  No flank pain, no hematuria   MS:  No joint pain or swelling.  No decreased range of motion.  No back pain.  Psych:  No change in mood or affect. No depression or anxiety.  No memory loss.   Vital Signs BP 130/86 (BP Location: Left Arm, Cuff Size: Normal)   Pulse 77   Ht 5' 5"  (1.651 m)   Wt 229 lb 12.8 oz (104.2 kg)   SpO2 100%   BMI 38.24 kg/m    Physical Exam:  General- No distress,  A&Ox3 ENT: No sinus tenderness, TM clear, pale nasal mucosa, no oral exudate,no post nasal drip, no LAN Cardiac: S1, S2, regular rate and rhythm, no murmur Chest: No wheeze/ rales/ dullness; no accessory muscle use, no nasal flaring, no sternal retractions, diminished per bases bilaterally Abd.: Soft Non-tender, bowel sounds positive, nondistended, Body mass index is 38.24 kg/m. Ext: No clubbing cyanosis, edema Neuro:  normal strength, moving all extremities x4, alert and oriented x3, appropriate Skin: No rashes, warm and dry Psych: normal mood and behavior   Assessment/Plan  Cough variant asthma Stable 21-monthinterval Denies cough or dyspnea Is compliant with Arnuity once daily Plan: Continue your Arnuity daily Continue your Nasacort as you have been doing Continue to increase her physical activity as this is very therapeutic for your lungs. Follow-up with Dr.  RChase Callerin 6 months Follow-up with eye doctor for recheck of ocular pressure. Please contact office for sooner follow up if symptoms do not improve or worsen or seek emergency care    Lung nodule Interval growth noted in May 2019 CT chest PET/CT was uptake PET avid 8.8 mm right upper lobe groundglass nodule PET/CT confirmed posterior left upper lobe lesion that has progressed highly concerning for neoplasm Plan We will schedule you to see Dr. BLamonte Sakaito assess you for an endocsopic bronchoscopy to biopsy the lung nodule that has grown. Follow up with Dr. BLamonte Sakaifor biopsy We will call you with a date and time. Please contact office for sooner follow up if symptoms do not improve or worsen or seek emergency care      SMagdalen Spatz NP 08/09/2017  7:49 PM

## 2017-08-09 NOTE — Patient Instructions (Addendum)
It is good to see you today. We will schedule you to see Dr. Lamonte Sakai to assess you for an endocsopic bronchoscopy to biopsy the lung nodule that has grown. We will continue Arnuity Rinse mouth after use. Remember to use your Pro Air Respiclick for breakthrough shortness of breath Follow up with Dr. Lamonte Sakai for biopsy We will call you with a date and time. Please contact office for sooner follow up if symptoms do not improve or worsen or seek emergency care

## 2017-08-09 NOTE — Telephone Encounter (Addendum)
Dr. Saunders Revel wanted to know if we can schedule an ENB for this pt. She was trying to get her into your schedule to evaluate her for the procedure but you do not have any appointments available. Please let us know how to proceed.

## 2017-08-09 NOTE — H&P (View-Only) (Signed)
History of Present Illness Ashley Parker is a 84 y.o. female former heavy smoker with chronic cough, asthma and pulmonary nodule suspicious for adenocarcinoma.    08/09/2017 Pt. Present for follow up and for results of her PET scan. She was seen by Dr. Chase Caller. There was new nodule noted. PET scan was ordered, which shows mildly hypermetabolic uptake.  We discussed at length that this new nodule needs to be biopsied.  I explained to her that Dr. Chase Caller is concerned that it may be a slow-growing adenocarcinoma. Patient states she has not had any weight loss, no hemoptysis, no worsening shortness of breath.  She is more concerned today about the fact that her glaucoma is getting worse.  She thought that inhaled corticosteroids would make her glaucoma worse, therefore she has stopped taking her Flonase, and her albuterol respite stick.  She states she has been taking her Arnuity, but has been concerned about the inhaled corticosteroid and that medication also.  She states she would like to come off the Arnuity, as she fears it is causing the worsening of her glaucoma.  I explained to her that the real concern with glaucoma in regard to inhalers is with anticholinergics.  I reassured her that she is not currently on any anticholinergics.  She is willing to continue the [place] for now, she will follow-up with her eye doctor for pressure recheck.  I have encouraged her to utilize both her Flonase and her albuterol rescue as prescribed. Patient denies fever, chest pain, orthopnea, or hemoptysis.    Test Results: PET/CT June 2019 The new solid right upper lobe nodule is mildly hypermetabolic, suspicious for adenocarcinoma. 2. The slowly enlarging solid component within the part solid left upper lobe lesion is also mildly hypermetabolic, similar in intensity to 2016 PET-CT. The progression of the solid component is suspicious for adenocarcinoma as well. 3. No evidence of metastatic disease or  other suspicious findings.   CT chest without contrast May 2019 Interval development of an 8 x 12 mm irregular soft tissue nodule within the right right upper lobe ground-glass nodule seen previously. Imaging features highly concerning for neoplasm. PET-CT may prove helpful to further evaluate. 2. Posterior left upper lobe lesion has progressed in the interval with solid soft tissue nodular component now measuring 10 mm. Lesion highly concerning for neoplasm. PET-CT could also be used to assess this lesion further. 3. No evidence for lymphadenopathy in the chest. 4. Cholelithiasis 5.  Aortic Atherosclerois (ICD10-170.0)  CBC Latest Ref Rng & Units 06/08/2015 05/08/2015 02/20/2014  WBC 4.0 - 10.5 K/uL 10.2 11.3(H) 11.7(H)  Hemoglobin 12.0 - 15.0 g/dL 13.8 14.5 12.2  Hematocrit 36.0 - 46.0 % 42.3 45.4 37.9  Platelets 150 - 400 K/uL 303 320 253    BMP Latest Ref Rng & Units 06/08/2015 05/08/2015 02/18/2014  Glucose 65 - 99 mg/dL 120(H) 101(H) 130(H)  BUN 6 - 20 mg/dL 13 24(H) 11  Creatinine 0.44 - 1.00 mg/dL 0.68 0.76 0.70  Sodium 135 - 145 mmol/L 141 137 137  Potassium 3.5 - 5.1 mmol/L 3.8 4.2 3.7  Chloride 101 - 111 mmol/L 106 106 105  CO2 22 - 32 mmol/L 22 24 25   Calcium 8.9 - 10.3 mg/dL 9.2 8.8(L) 9.5    BNP No results found for: BNP  ProBNP No results found for: PROBNP  PFT    Component Value Date/Time   FEV1PRE 1.89 07/07/2014 1052   FEV1POST 2.09 07/07/2014 1052   FVCPRE 2.96 07/07/2014 1052   FVCPOST  2.97 07/07/2014 1052   TLC 5.37 07/07/2014 1052   DLCOUNC 17.20 07/07/2014 1052   PREFEV1FVCRT 64 07/07/2014 1052   PSTFEV1FVCRT 70 07/07/2014 1052    Nm Pet Image Restag (ps) Skull Base To Thigh  Result Date: 08/02/2017 CLINICAL DATA:  Initial treatment strategy for new right upper lobe pulmonary nodule. Progressive left upper lobe part solid nodule. EXAM: NUCLEAR MEDICINE PET SKULL BASE TO THIGH TECHNIQUE: 12.6 mCi F-18 FDG was injected intravenously. Full-ring  PET imaging was performed from the skull base to thigh after the radiotracer. CT data was obtained and used for attenuation correction and anatomic localization. Fasting blood glucose: 161 mg/dl COMPARISON:  PET-CT 12/09/2014.  Chest CT 12/10/2015 and 07/04/2017. FINDINGS: Mediastinal blood pool activity: SUV max 3.1 NECK: No hypermetabolic cervical lymph nodes are identified.There are no lesions of the pharyngeal mucosal space. Incidental CT findings: Bilateral carotid atherosclerosis with a possible right-sided stent. CHEST: There are no hypermetabolic mediastinal, hilar or axillary lymph nodes. The solid 9 x 10 mm right upper lobe nodule on image 18/8 is mildly hypermetabolic with an SUV max of 3.1. The sub solid left upper lobe lesion measures 19 x 23 mm on image 11/8 and has a solid component posteriorly measuring up to 12 mm in diameter. This lesion is mildly hypermetabolic with an SUV max 2.3. The hypermetabolic activity is similar to the previous PET-CT. No other suspicious pulmonary nodules. Incidental CT findings: Atherosclerosis of the aorta, great vessels and coronary arteries. Probable calcifications of the aortic valve. There is a moderate size hiatal hernia. ABDOMEN/PELVIS: There is no hypermetabolic activity within the liver, adrenal glands, spleen or pancreas. There is no hypermetabolic nodal activity. Incidental CT findings: There is diffuse hepatic steatosis. Multiple nitrogen containing gallstones are present. Right renal cyst, diverticular changes of the sigmoid colon, aortic and branch vessel atherosclerosis and a periumbilical hernia containing only fat are noted. SKELETON: There is no hypermetabolic activity to suggest osseous metastatic disease. Probable mild left shoulder bursal hypermetabolic activity. Incidental CT findings: none IMPRESSION: 1. The new solid right upper lobe nodule is mildly hypermetabolic, suspicious for adenocarcinoma. 2. The slowly enlarging solid component within the  part solid left upper lobe lesion is also mildly hypermetabolic, similar in intensity to 2016 PET-CT. The progression of the solid component is suspicious for adenocarcinoma as well. 3. No evidence of metastatic disease or other suspicious findings. 4. Incidental findings including diffuse atherosclerosis, hepatic steatosis and cholelithiasis. Electronically Signed   By: Richardean Sale [name]   On: 08/02/2017 17:27     Past medical hx Past Medical History:  Diagnosis Date  . Arthritis   . Cough variant asthma 01/20/2015  . Crohn's ileitis ([place])    Mild, mostly asymptomatic, not on therapy  . Fundic gland polyposis of stomach   . GERD (gastroesophageal reflux disease)   . Glaucoma   . Hemorrhoids    Internal and External  . Hypertension   . Macular degeneration   . Obesity, unspecified   . Osteoporosis   . Pelvic fracture ([place]) 2007  . Stricture of esophagus    distal  . Unspecified vitamin D deficiency   . Wrist fracture, left 1943, 1985     Social History   Tobacco Use  . Smoking status: Former Smoker    Packs/day: 1.00    Years: 32.00    Pack years: 32.00    Types: Cigarettes    Last attempt to quit: 02/19/1984    Years since quitting: 33.4  . Smokeless tobacco: Never  Used  Substance Use Topics  . Alcohol use: Yes    Alcohol/week: 0.0 oz    Comment: 1 drink daily   . Drug use: No    Ms.Kirley reports that she quit smoking about 33 years ago. Her smoking use included cigarettes. She has a 32.00 pack-year smoking history. She has never used smokeless tobacco. She reports that she drinks alcohol. She reports that she does not use drugs.  Tobacco Cessation: Former smoker quit 1985 with a 32-pack-year smoking history  Past surgical hx, Family hx, Social hx all reviewed.  Current Outpatient Medications on File Prior to Visit  Medication Sig  . Albuterol Sulfate (PROAIR RESPICLICK IN) Inhale 1-2 puffs into the lungs daily.  [name] b complex vitamins tablet Take 1 tablet by  mouth daily.  . Calcium Carbonate (CALCIUM 600 PO) Take 1 tablet by mouth 3 (three) times daily.  . Calcium Carbonate Antacid (ANTACID PO) Take 1 tablet by mouth 2 (two) times daily.  . Cholecalciferol (VITAMIN D3) 5000 units CAPS Take 5,000 Units by mouth.  . cyanocobalamin 1000 MCG tablet Take 1,000 mcg by mouth daily.  . dorzolamide (TRUSOPT) 2 % ophthalmic solution INSTILL 1 DROP INTO BOTH EYES 3 TIMES A DAY  . EPINEPHrine 0.3 mg/0.3 mL IJ SOAJ injection Inject 0.3 mg into the muscle as needed (for allergic reaction).  . Flaxseed, Linseed, (FLAXSEED OIL PO) Take 1 tablet by mouth daily.  . fluticasone (FLONASE) 50 MCG/ACT nasal spray Place 1 spray into both nostrils daily.   . Fluticasone Furoate (ARNUITY ELLIPTA) 100 MCG/ACT AEPB Take 1 puff by mouth daily.  . irbesartan (AVAPRO) 300 MG tablet Take 300 mg by mouth daily.   [name] LUMIGAN 0.01 % SOLN Place 1 drop into both eyes at bedtime.  . Magnesium 400 MG TABS Take 400 mg by mouth.  . Multiple Vitamins-Minerals (ICAPS AREDS 2 PO) Take 4 capsules by mouth daily.  . Omega-3 Fatty Acids (FISH OIL) 1200 MG CAPS Take 1,200 mg by mouth.  . timolol (TIMOPTIC) 0.5 % ophthalmic solution INSTILL 1 DROP INTO BOTH EYES IN THE MORNING  . Turmeric 450 MG CAPS Take 450 mg by mouth.  . vitamin C (ASCORBIC ACID) 500 MG tablet Take 500 mg by mouth daily.  . vitamin E 400 UNIT capsule Take 400 Units by mouth daily.     No current facility-administered medications on file prior to visit.      Allergies  Allergen Reactions  . Wasp Venom Anaphylaxis and Other (See Comments)    Severe pain in lower stomach, sweatiness, and BP drops low   . Biaxin [Clarithromycin] Other (See Comments)    achy   . Brimonidine Other (See Comments)    redness    Review Of Systems:  Constitutional:   No  weight loss, night sweats,  Fevers, chills, fatigue, or  lassitude.  HEENT:   No headaches,  Difficulty swallowing,  Tooth/dental problems, or  Sore throat,                 No sneezing, itching, ear ache, nasal congestion, post nasal drip,   CV:  No chest pain,  Orthopnea, PND, swelling in lower extremities, anasarca, dizziness, palpitations, syncope.   GI  No heartburn, indigestion, abdominal pain, nausea, vomiting, diarrhea, change in bowel habits, loss of appetite, bloody stools.   Resp: Rare shortness of breath with exertion or at rest.  No excess mucus, no productive cough,  No non-productive cough,  No coughing up of blood.  No  change in color of mucus.  No wheezing.  No chest wall deformity  Skin: no rash or lesions.  GU: no dysuria, change in color of urine, no urgency or frequency.  No flank pain, no hematuria   MS:  No joint pain or swelling.  No decreased range of motion.  No back pain.  Psych:  No change in mood or affect. No depression or anxiety.  No memory loss.   Vital Signs BP 130/86 (BP Location: Left Arm, Cuff Size: Normal)   Pulse 77   Ht 5' 5"  (1.651 m)   Wt 229 lb 12.8 oz (104.2 kg)   SpO2 100%   BMI 38.24 kg/m    Physical Exam:  General- No distress,  A&Ox3 ENT: No sinus tenderness, TM clear, pale nasal mucosa, no oral exudate,no post nasal drip, no LAN Cardiac: S1, S2, regular rate and rhythm, no murmur Chest: No wheeze/ rales/ dullness; no accessory muscle use, no nasal flaring, no sternal retractions, diminished per bases bilaterally Abd.: Soft Non-tender, bowel sounds positive, nondistended, Body mass index is 38.24 kg/m. Ext: No clubbing cyanosis, edema Neuro:  normal strength, moving all extremities x4, alert and oriented x3, appropriate Skin: No rashes, warm and dry Psych: normal mood and behavior   Assessment/Plan  Cough variant asthma Stable 65-monthinterval Denies cough or dyspnea Is compliant with Arnuity once daily Plan: Continue your Arnuity daily Continue your Nasacort as you have been doing Continue to increase her physical activity as this is very therapeutic for your lungs. Follow-up with Dr.  RChase Callerin 6 months Follow-up with eye doctor for recheck of ocular pressure. Please contact office for sooner follow up if symptoms do not improve or worsen or seek emergency care    Lung nodule Interval growth noted in May 2019 CT chest PET/CT was uptake PET avid 8.8 mm right upper lobe groundglass nodule PET/CT confirmed posterior left upper lobe lesion that has progressed highly concerning for neoplasm Plan We will schedule you to see Dr. BLamonte Sakaito assess you for an endocsopic bronchoscopy to biopsy the lung nodule that has grown. Follow up with Dr. BLamonte Sakaifor biopsy We will call you with a date and time. Please contact office for sooner follow up if symptoms do not improve or worsen or seek emergency care      SMagdalen Spatz NP 08/09/2017  7:49 PM

## 2017-08-09 NOTE — Telephone Encounter (Signed)
Yes send me and order Joellen Jersey

## 2017-08-09 NOTE — Telephone Encounter (Signed)
Ashley Parker  has hx of nodules. For some reason I assumed incorrectly that she had had ENB and was non diagnostic. However, Eric Form aPP pointed out that this infromation is incorrect. I agree with her assessment . WE both agree that patient now needs ENB  This documentation is to recognize my awareness of the documentation error  Dr. Brand Males, [name], [facility].C.P Pulmonary and Critical Care Medicine Staff Physician, [place] Director - Interstitial Lung Disease  Program  Pulmonary [place] at [place], [place], [address]  Pager: [phone], If no answer or between  15:00h - 7:00h: call [phone] Telephone: [phone]

## 2017-08-09 NOTE — Telephone Encounter (Signed)
noted 

## 2017-08-09 NOTE — Assessment & Plan Note (Signed)
Interval growth noted in May 2019 CT chest PET/CT was uptake PET avid 8.8 mm right upper lobe groundglass nodule PET/CT confirmed posterior left upper lobe lesion that has progressed highly concerning for neoplasm Plan We will schedule you to see Dr. Lamonte Sakai to assess you for an endocsopic bronchoscopy to biopsy the lung nodule that has grown. Follow up with Dr. Lamonte Sakai for biopsy We will call you with a date and time. Please contact office for sooner follow up if symptoms do not improve or worsen or seek emergency care

## 2017-08-09 NOTE — Assessment & Plan Note (Addendum)
Stable 25-monthinterval Denies cough or dyspnea Is compliant with Arnuity once daily Plan: Continue your Arnuity daily Continue your Nasacort as you have been doing Continue to increase her physical activity as this is very therapeutic for your lungs. Follow-up with Dr. RChase Callerin 6 months Follow-up with eye doctor for recheck of ocular pressure. Please contact office for sooner follow up if symptoms do not improve or worsen or seek emergency care

## 2017-08-10 NOTE — Telephone Encounter (Signed)
I should be able to set ot up 08/23/17@[facility]  if she still wants to do this put me in an order Joellen Jersey

## 2017-08-10 NOTE — Telephone Encounter (Signed)
Ok order placed. Thanks Libby.

## 2017-08-10 NOTE — Telephone Encounter (Signed)
Thank you :)

## 2017-08-14 NOTE — Pre-Procedure Instructions (Signed)
Ashley Parker  08/14/2017      [facility] - [place], [address] [place] [address] Phone: [phone] Fax: [phone]  [facility] [place], [address] [place] [address] Phone: [phone] Fax: [phone]    Your procedure is scheduled on June 26th. 2019  Report to [facility] Admitting at [phone] A.M.  Call this number if you have problems the morning of surgery:  [phone]   Remember:  Do not eat or drink after midnight the night before surgery.   Take these medicines the morning of surgery:  Albuterol Sulfate (PROAIR RESPICLICK IN)-as needed [place] Fluticasone Furoate (ARNUITY ELLIPTA) 100 MCG/ACT AEPB dorzolamide (TRUSOPT) 2 % ophthalmic solution timolol (BETIMOL) 0.5 % ophthalmic solution fluticasone (FLONASE) 50 MCG/ACT nasal spray-as needed ketotifen (ZADITOR) 0.025 % ophthalmic solution-as needed Polyethyl Glycol-Propyl Glycol (SYSTANE) 0.4-0.3 % SOLN-as needed    7 days prior to surgery STOP taking any Aspirin(unless otherwise instructed by your surgeon), Aleve, Naproxen, Ibuprofen, Motrin, Advil, Goody's, BC's, all herbal medications, fish oil, and all vitamins  Follow your doctors instructions regarding your Aspirin.  If no instructions were given by your doctor, then you will need to call the prescribing office office to get instructions.       Do not wear jewelry, make-up or nail polish.  Do not wear lotions, powders, or perfumes, or deodorant.  Do not shave 48 hours prior to surgery.  Men may shave face and neck.  Do not bring valuables to the hospital.  [facility] is not responsible for any belongings or valuables.  Eyeglasses, contacts, hearing aids, dentures or bridgework may not be worn into surgery.  Leave your suitcase in the car.  After surgery it may be  brought to your room.  For patients admitted to the hospital, discharge time will be determined by your treatment team.  Patients discharged the day of surgery will not be allowed to drive home.    [place]- Preparing For Surgery  Before surgery, you can play an important role. Because skin is not sterile, your skin needs to be as free of germs as possible. You can reduce the number of germs on your skin by washing with CHG (chlorahexidine gluconate) Soap before surgery.  CHG is an antiseptic cleaner which kills germs and bonds with the skin to continue killing germs even after washing.    Oral Hygiene is also important to reduce your risk of infection.  Remember - BRUSH YOUR TEETH THE MORNING OF SURGERY WITH YOUR REGULAR TOOTHPASTE  Please do not use if you have an allergy to CHG or antibacterial soaps. If your skin becomes reddened/irritated stop using the CHG.  Do not shave (including legs and underarms) for at least 48 hours prior to first CHG shower. It is OK to shave your face.  Please follow these instructions carefully.   1. Shower the NIGHT BEFORE SURGERY and the MORNING OF SURGERY with CHG.   2. If you chose to wash your hair, wash your hair first as usual with your normal shampoo.  3. After you shampoo, rinse your hair and body thoroughly to remove the shampoo.  4. Use CHG as you would any other liquid soap. You can apply CHG directly to the skin and wash gently with a scrungie or a clean washcloth.  5. Apply the CHG Soap to your body ONLY FROM THE NECK DOWN.  Do not use on open wounds or open sores. Avoid contact with your eyes, ears, mouth and genitals (private parts). Wash Face and genitals (private parts)  with your normal soap.  6. Wash thoroughly, paying special attention to the area where your surgery will be performed.  7. Thoroughly rinse your body with warm water from the neck down.  8. DO NOT shower/wash with your normal soap after using and rinsing off the  CHG Soap.  9. Pat yourself dry with a CLEAN TOWEL.  10. Wear CLEAN PAJAMAS to bed the night before surgery, wear comfortable clothes the morning of surgery  11. Place CLEAN SHEETS on your bed the night of your first shower and DO NOT SLEEP WITH PETS.    Day of Surgery:  Do not apply any deodorants/lotions.  Please wear clean clothes to the hospital/surgery center.   Remember to brush your teeth WITH YOUR REGULAR TOOTHPASTE.   Please read over the following fact sheets that you were given.

## 2017-08-15 ENCOUNTER — Encounter (HOSPITAL_COMMUNITY)
Admission: RE | Admit: 2017-08-15 | Discharge: 2017-08-15 | Disposition: A | Discharge status: Home / Self Care | Payer: PPO | Source: Ambulatory Visit | Attending: Orthopedic Surgery | Admitting: Orthopedic Surgery

## 2017-08-15 ENCOUNTER — Encounter (HOSPITAL_COMMUNITY): Payer: Self-pay

## 2017-08-15 ENCOUNTER — Other Ambulatory Visit: Payer: Self-pay

## 2017-08-15 ENCOUNTER — Telehealth: Payer: Self-pay | Admitting: Emergency Medicine

## 2017-08-15 DIAGNOSIS — I10 Essential (primary) hypertension: Secondary | ICD-10-CM | POA: Diagnosis not present

## 2017-08-15 DIAGNOSIS — Z01810 Encounter for preprocedural cardiovascular examination: Secondary | ICD-10-CM | POA: Diagnosis not present

## 2017-08-15 DIAGNOSIS — Z01812 Encounter for preprocedural laboratory examination: Secondary | ICD-10-CM | POA: Insufficient documentation

## 2017-08-15 HISTORY — DX: Basal cell carcinoma of skin of unspecified parts of face: C44.310

## 2017-08-15 HISTORY — DX: Personal history of other diseases of the digestive system: Z87.19

## 2017-08-15 LAB — CBC
HCT: 45.3 % (ref 36.0–46.0)
Hemoglobin: 14.5 g/dL (ref 12.0–15.0)
MCH: 28.9 pg (ref 26.0–34.0)
MCHC: 32 g/dL (ref 30.0–36.0)
MCV: 90.2 fL (ref 78.0–100.0)
PLATELETS: 282 10*3/uL (ref 150–400)
RBC: 5.02 MIL/uL (ref 3.87–5.11)
RDW: 13.5 % (ref 11.5–15.5)
WBC: 7.6 10*3/uL (ref 4.0–10.5)

## 2017-08-15 LAB — BASIC METABOLIC PANEL
Anion gap: 9 (ref 5–15)
BUN: 16 mg/dL (ref 6–20)
CALCIUM: 9.2 mg/dL (ref 8.9–10.3)
CO2: 24 mmol/L (ref 22–32)
CREATININE: 0.99 mg/dL (ref 0.44–1.00)
Chloride: 109 mmol/L (ref 101–111)
GFR, EST AFRICAN AMERICAN: 59 mL/min — AB (ref >60)
GFR, EST NON AFRICAN AMERICAN: 51 mL/min — AB (ref >60)
Glucose, Bld: 103 mg/dL — ABNORMAL HIGH (ref 65–99)
Potassium: 4.3 mmol/L (ref 3.5–5.1)
SODIUM: 142 mmol/L (ref 135–145)

## 2017-08-15 NOTE — Progress Notes (Signed)
PCP - Dr. Prince Solian Cardiologist - patient denies   Chest x-ray - N/A EKG - 08/15/2017  Stress Test - patient denies  ECHO - 2015  Cardiac Cath - patient denies   Sleep Study - patient denies   Blood Thinner Instructions: ASA Aspirin Instructions: stop ASA 7 days prior to procedure.   Anesthesia review: No  Patient denies shortness of breath, fever,and chest pain at PAT appointment. Patient does report a cough that started 08/13/2017 with thin sputum clear in color. Pt afebrile and does not appear to have an increase in work of breath.   Patient verbalized understanding of instructions that were given to them at the PAT appointment. Patient was also instructed that they will need to review over the PAT instructions again at home before surgery.

## 2017-08-15 NOTE — Telephone Encounter (Signed)
Called and spoke with Ashley Parker. Patient is coming in at 3:00 today for pre admission testing. They are needing the orders placed.   RB please place these orders thank you.

## 2017-08-15 NOTE — Progress Notes (Signed)
Spoke with Danielle from Dr. Agustina Caroli office about surgical orders. Awaiting orders at this time. Jacqlyn Larsen, RN

## 2017-08-16 NOTE — Telephone Encounter (Signed)
done

## 2017-08-23 ENCOUNTER — Ambulatory Visit (HOSPITAL_COMMUNITY): Payer: PPO | Admitting: Certified Registered Nurse Anesthetist

## 2017-08-23 ENCOUNTER — Ambulatory Visit (HOSPITAL_COMMUNITY)
Admission: RE | Admit: 2017-08-23 | Discharge: 2017-08-23 | Disposition: A | Discharge status: Home / Self Care | Payer: PPO | Source: Ambulatory Visit | Attending: Emergency Medicine | Admitting: Emergency Medicine

## 2017-08-23 ENCOUNTER — Other Ambulatory Visit: Payer: Self-pay

## 2017-08-23 ENCOUNTER — Encounter (HOSPITAL_COMMUNITY)
Admission: RE | Disposition: A | Discharge status: Home / Self Care | Payer: Self-pay | Source: Ambulatory Visit | Attending: Emergency Medicine

## 2017-08-23 ENCOUNTER — Encounter (HOSPITAL_COMMUNITY): Payer: Self-pay | Admitting: *Deleted

## 2017-08-23 ENCOUNTER — Ambulatory Visit (HOSPITAL_COMMUNITY): Payer: PPO

## 2017-08-23 ENCOUNTER — Ambulatory Visit (HOSPITAL_COMMUNITY): Payer: PPO | Admitting: Vascular Surgery

## 2017-08-23 DIAGNOSIS — J849 Interstitial pulmonary disease, unspecified: Secondary | ICD-10-CM | POA: Insufficient documentation

## 2017-08-23 DIAGNOSIS — Z9889 Other specified postprocedural states: Secondary | ICD-10-CM

## 2017-08-23 DIAGNOSIS — E559 Vitamin D deficiency, unspecified: Secondary | ICD-10-CM | POA: Insufficient documentation

## 2017-08-23 DIAGNOSIS — K5090 Crohn's disease, unspecified, without complications: Secondary | ICD-10-CM | POA: Diagnosis not present

## 2017-08-23 DIAGNOSIS — I10 Essential (primary) hypertension: Secondary | ICD-10-CM | POA: Diagnosis not present

## 2017-08-23 DIAGNOSIS — Z87891 Personal history of nicotine dependence: Secondary | ICD-10-CM | POA: Insufficient documentation

## 2017-08-23 DIAGNOSIS — R918 Other nonspecific abnormal finding of lung field: Secondary | ICD-10-CM | POA: Diagnosis not present

## 2017-08-23 DIAGNOSIS — Z79899 Other long term (current) drug therapy: Secondary | ICD-10-CM | POA: Diagnosis not present

## 2017-08-23 DIAGNOSIS — M5412 Radiculopathy, cervical region: Secondary | ICD-10-CM | POA: Diagnosis not present

## 2017-08-23 DIAGNOSIS — K219 Gastro-esophageal reflux disease without esophagitis: Secondary | ICD-10-CM | POA: Insufficient documentation

## 2017-08-23 DIAGNOSIS — R0989 Other specified symptoms and signs involving the circulatory and respiratory systems: Secondary | ICD-10-CM | POA: Diagnosis not present

## 2017-08-23 DIAGNOSIS — Z419 Encounter for procedure for purposes other than remedying health state, unspecified: Secondary | ICD-10-CM

## 2017-08-23 HISTORY — PX: MINOR PLACEMENT OF FIDUCIAL: SHX6748

## 2017-08-23 HISTORY — PX: VIDEO BRONCHOSCOPY WITH ENDOBRONCHIAL NAVIGATION: SHX6175

## 2017-08-23 SURGERY — VIDEO BRONCHOSCOPY WITH ENDOBRONCHIAL NAVIGATION
Anesthesia: General | Site: Chest

## 2017-08-23 MED ORDER — EPHEDRINE SULFATE 50 MG/ML IJ SOLN
INTRAMUSCULAR | Status: AC
  Filled 2017-08-23: qty 1

## 2017-08-23 MED ORDER — FENTANYL CITRATE (PF) 250 MCG/5ML IJ SOLN
INTRAMUSCULAR | Status: DC | PRN
  Administered 2017-08-23: 150 ug via INTRAVENOUS
  Administered 2017-08-23: 50 ug via INTRAVENOUS

## 2017-08-23 MED ORDER — ONDANSETRON HCL 4 MG/2ML IJ SOLN
INTRAMUSCULAR | Status: AC
  Filled 2017-08-23: qty 2

## 2017-08-23 MED ORDER — ROCURONIUM BROMIDE 10 MG/ML (PF) SYRINGE
PREFILLED_SYRINGE | INTRAVENOUS | Status: DC | PRN
  Administered 2017-08-23: 50 mg via INTRAVENOUS

## 2017-08-23 MED ORDER — ROCURONIUM BROMIDE 50 MG/5ML IV SOLN
INTRAVENOUS | Status: AC
  Filled 2017-08-23: qty 1

## 2017-08-23 MED ORDER — LACTATED RINGERS IV SOLN
INTRAVENOUS | Status: DC
  Administered 2017-08-23: 09:00:00 via INTRAVENOUS

## 2017-08-23 MED ORDER — LIDOCAINE 2% (20 MG/ML) 5 ML SYRINGE
INTRAMUSCULAR | Status: DC | PRN
  Administered 2017-08-23: 100 mg via INTRAVENOUS

## 2017-08-23 MED ORDER — ONDANSETRON HCL 4 MG/2ML IJ SOLN
INTRAMUSCULAR | Status: DC | PRN
  Administered 2017-08-23: 4 mg via INTRAVENOUS

## 2017-08-23 MED ORDER — FENTANYL CITRATE (PF) 100 MCG/2ML IJ SOLN: 25.0000 ug | INTRAMUSCULAR | Status: DC | PRN

## 2017-08-23 MED ORDER — PROPOFOL 10 MG/ML IV BOLUS
INTRAVENOUS | Status: DC | PRN
  Administered 2017-08-23: 50 mg via INTRAVENOUS
  Administered 2017-08-23: 30 mg via INTRAVENOUS
  Administered 2017-08-23: 100 mg via INTRAVENOUS

## 2017-08-23 MED ORDER — LIDOCAINE 2% (20 MG/ML) 5 ML SYRINGE
INTRAMUSCULAR | Status: AC
  Filled 2017-08-23: qty 5

## 2017-08-23 MED ORDER — SUGAMMADEX SODIUM 200 MG/2ML IV SOLN
INTRAVENOUS | Status: DC | PRN
  Administered 2017-08-23: 206 mg via INTRAVENOUS

## 2017-08-23 MED ORDER — FENTANYL CITRATE (PF) 250 MCG/5ML IJ SOLN
INTRAMUSCULAR | Status: AC
  Filled 2017-08-23: qty 5

## 2017-08-23 MED ORDER — EPHEDRINE SULFATE-NACL 50-0.9 MG/10ML-% IV SOSY
PREFILLED_SYRINGE | INTRAVENOUS | Status: DC | PRN
  Administered 2017-08-23: 10 mg via INTRAVENOUS

## 2017-08-23 MED ORDER — SODIUM CHLORIDE 0.9 % IV SOLN
INTRAVENOUS | Status: DC | PRN
  Administered 2017-08-23: 25 ug/min via INTRAVENOUS

## 2017-08-23 MED ORDER — DEXAMETHASONE SODIUM PHOSPHATE 10 MG/ML IJ SOLN
INTRAMUSCULAR | Status: AC
  Filled 2017-08-23: qty 1

## 2017-08-23 MED ORDER — DEXAMETHASONE SODIUM PHOSPHATE 10 MG/ML IJ SOLN
INTRAMUSCULAR | Status: DC | PRN
  Administered 2017-08-23: 5 mg via INTRAVENOUS

## 2017-08-23 MED ORDER — 0.9 % SODIUM CHLORIDE (POUR BTL) OPTIME
TOPICAL | Status: DC | PRN
  Administered 2017-08-23: 1000 mL

## 2017-08-23 MED ORDER — MEPERIDINE HCL 50 MG/ML IJ SOLN: 6.2500 mg | INTRAMUSCULAR | Status: DC | PRN

## 2017-08-23 MED ORDER — SUGAMMADEX SODIUM 200 MG/2ML IV SOLN
INTRAVENOUS | Status: AC
  Filled 2017-08-23: qty 2

## 2017-08-23 MED ORDER — METOCLOPRAMIDE HCL 5 MG/ML IJ SOLN: 10.0000 mg | Freq: Once | INTRAMUSCULAR | Status: DC | PRN

## 2017-08-23 SURGICAL SUPPLY — 40 items
ADAPTER BRONCH F/PENTAX (ADAPTER) ×4 IMPLANT
BRUSH CYTOL CELLEBRITY 1.5X140 (MISCELLANEOUS) ×4 IMPLANT
BRUSH SUPERTRAX BIOPSY (INSTRUMENTS) IMPLANT
BRUSH SUPERTRAX NDL-TIP CYTO (INSTRUMENTS) ×4 IMPLANT
CANISTER SUCT 3000ML PPV (MISCELLANEOUS) ×4 IMPLANT
CHANNEL WORK EXTEND EDGE 180 (KITS) IMPLANT
CHANNEL WORK EXTEND EDGE 45 (KITS) IMPLANT
CHANNEL WORK EXTEND EDGE 90 (KITS) IMPLANT
CONT SPEC 4OZ CLIKSEAL STRL BL (MISCELLANEOUS) ×20 IMPLANT
COVER BACK TABLE 60X90IN (DRAPES) ×4 IMPLANT
FILTER STRAW FLUID ASPIR (MISCELLANEOUS) IMPLANT
FORCEPS BIOP SUPERTRX PREMAR (INSTRUMENTS) ×4 IMPLANT
GAUZE SPONGE 4X4 12PLY STRL (GAUZE/BANDAGES/DRESSINGS) ×4 IMPLANT
GLOVE BIO SURGEON STRL SZ7.5 (GLOVE) ×8 IMPLANT
GOWN STRL REUS W/ TWL LRG LVL3 (GOWN DISPOSABLE) ×4 IMPLANT
GOWN STRL REUS W/TWL LRG LVL3 (GOWN DISPOSABLE) ×4
KIT CLEAN ENDO COMPLIANCE (KITS) ×4 IMPLANT
KIT LOCATABLE GUIDE (CANNULA) IMPLANT
KIT MARKER FIDUCIAL DELIVERY (KITS) ×4 IMPLANT
KIT PROCEDURE EDGE 180 (KITS) ×4 IMPLANT
KIT PROCEDURE EDGE 45 (KITS) IMPLANT
KIT PROCEDURE EDGE 90 (KITS) IMPLANT
KIT TURNOVER KIT B (KITS) ×4 IMPLANT
MARKER FIDUCIAL SL NIT COIL (Implant Marker) ×16 IMPLANT
MARKER SKIN DUAL TIP RULER LAB (MISCELLANEOUS) ×4 IMPLANT
NEEDLE SUPERTRX PREMARK BIOPSY (NEEDLE) ×8 IMPLANT
NS IRRIG 1000ML POUR BTL (IV SOLUTION) ×4 IMPLANT
OIL SILICONE PENTAX (PARTS (SERVICE/REPAIRS)) ×4 IMPLANT
PAD ARMBOARD 7.5X6 YLW CONV (MISCELLANEOUS) ×8 IMPLANT
PATCHES PATIENT (LABEL) ×12 IMPLANT
SYR 20CC LL (SYRINGE) ×4 IMPLANT
SYR 20ML ECCENTRIC (SYRINGE) ×4 IMPLANT
SYR 50ML SLIP (SYRINGE) ×4 IMPLANT
TOWEL OR 17X24 6PK STRL BLUE (TOWEL DISPOSABLE) ×4 IMPLANT
TRAP SPECIMEN MUCOUS 40CC (MISCELLANEOUS) IMPLANT
TUBE CONNECTING 20'X1/4 (TUBING) ×1
TUBE CONNECTING 20X1/4 (TUBING) ×3 IMPLANT
UNDERPAD 30X30 (UNDERPADS AND DIAPERS) ×4 IMPLANT
VALVE DISPOSABLE (MISCELLANEOUS) ×4 IMPLANT
WATER STERILE IRR 1000ML POUR (IV SOLUTION) ×4 IMPLANT

## 2017-08-23 NOTE — Op Note (Signed)
Video Bronchoscopy with Electromagnetic Navigation Procedure Note  Date of Operation: 08/23/2017  Pre-op Diagnosis: Bilateral pulmonary nodules  Post-op Diagnosis: Same  Surgeon: Baltazar Apo  Assistants: None  Anesthesia: General endotracheal anesthesia  Operation: Flexible video fiberoptic bronchoscopy with electromagnetic navigation and biopsies.  Estimated Blood Loss: 30 cc  Complications: None apparent  Indications and History: Ashley Parker is a 84 y.o. female with history of asthma followed in our office by Dr. Chase Caller for pulmonary nodules.  She was noted to have an enlarging left upper lobe nodule and a new right upper lobe nodule both which were weakly hypermetabolic on PET scan.  Recommendation was made to achieve a tissue diagnosis via navigational bronchoscopy.  The risks, benefits, complications, treatment options and expected outcomes were discussed with the patient.  The possibilities of pneumothorax, pneumonia, reaction to medication, pulmonary aspiration, perforation of a viscus, bleeding, failure to diagnose a condition and creating a complication requiring transfusion or operation were discussed with the patient who freely signed the consent.    Description of Procedure: The patient was seen in the Preoperative Area, was examined and was deemed appropriate to proceed.  The patient was taken to OR 10, identified as Ashley Parker and the procedure verified as Flexible Video Fiberoptic Bronchoscopy.  A Time Out was held and the above information confirmed.   Prior to the date of the procedure a high-resolution CT scan of the chest was performed. Utilizing [place] a virtual tracheobronchial tree was generated to allow the creation of distinct navigation pathways to the patient's parenchymal abnormalities. After being taken to the operating room general anesthesia was initiated and the patient  was orally intubated. The video fiberoptic bronchoscope was  introduced via the endotracheal tube and a general inspection was performed which showed some narrowing of the right upper lobe airways, some generalized edema.  The left upper lobe airways were somewhat friable and had some oozing of blood even with light contact with the bronchoscope.  There were no endobronchial lesions or abnormal secretions. The extendable working channel and locator guide were introduced into the bronchoscope. The distinct navigation pathways prepared prior to this procedure were then utilized to navigate to within 0.5 to 1.5 cm of patient's lesion right upper lobe and left upper lobe nodules identified on CT scan. The extendable working channel was secured into place and the locator guide was withdrawn. Under fluoroscopic guidance both left upper lobe and right upper lobe transbronchial needle brushings, transbronchial Wang needle biopsies, and transbronchial forceps biopsies were performed to be sent for cytology and pathology. A bronchioalveolar lavage was performed in the left upper lobe and right upper lobe and sent for cytology and microbiology (bacterial, fungal, AFB smears and cultures).  Fiducial markers were placed in proximity to both the left upper lobe and right upper lobe nodules to facilitate radiation therapy should this become indicated at some point in the future.  At the end of the procedure a general airway inspection was performed and there was no evidence of active bleeding. The bronchoscope was removed.  The patient tolerated the procedure well. There was no significant blood loss and there were no obvious complications. A post-procedural chest x-ray is pending.  Samples: 1. Transbronchial needle brushings from left upper lobe nodule 2. Transbronchial Wang needle biopsies from left upper lobe nodule 3. Transbronchial forceps biopsies from left upper lobe nodule 4. Bronchoalveolar lavage from left upper lobe 5. Transbronchial needle brushings from right upper lobe  nodule 6. Transbronchial Wang needle biopsies  from right upper lobe nodule 7. Transbronchial forceps biopsies from right upper lobe nodule 7. Bronchoalveolar lavage from right upper lobe  Plans:  The patient will be discharged from the PACU to home when recovered from anesthesia and after chest x-ray is reviewed. We will review the cytology, pathology and microbiology results with the patient when they become available. Outpatient followup will be with Dr Lamonte Sakai or Dr Chase Caller.    Baltazar Apo, MD, PhD 08/23/2017, 12:11 PM [facility] [phone] or if no answer [phone]

## 2017-08-23 NOTE — Transfer of Care (Signed)
Immediate Anesthesia Transfer of Care Note  Patient: Ashley Parker  Procedure(s) Performed: VIDEO BRONCHOSCOPY WITH ENDOBRONCHIAL NAVIGATION (N/A ) MINOR PLACEMENT OF FIDUCIAL - Left Upper Lobe Lung x2, Right Upper Lobe Lung x3 (Chest)  Patient Location: PACU  Anesthesia Type:General  Level of Consciousness: awake, alert  and oriented  Airway & Oxygen Therapy: Patient Spontanous Breathing and Patient connected to nasal cannula oxygen  Post-op Assessment: Report given to RN and Post -op Vital signs reviewed and stable  Post vital signs: Reviewed and stable  Last Vitals:  Vitals Value Taken Time  BP 151/73 08/23/2017 12:13 PM  Temp    Pulse 76 08/23/2017 12:17 PM  Resp 21 08/23/2017 12:17 PM  SpO2 98 % 08/23/2017 12:17 PM  Vitals shown include unvalidated device data.  Last Pain:  Vitals:   08/23/17 0807  TempSrc: Oral         Complications: No apparent anesthesia complications

## 2017-08-23 NOTE — Anesthesia Postprocedure Evaluation (Signed)
Anesthesia Post Note  Patient: Ashley Parker  Procedure(s) Performed: VIDEO BRONCHOSCOPY WITH ENDOBRONCHIAL NAVIGATION (N/A ) MINOR PLACEMENT OF FIDUCIAL - Left Upper Lobe Lung x2, Right Upper Lobe Lung x3 (Chest)     Patient location during evaluation: PACU Anesthesia Type: General Level of consciousness: awake and alert Pain management: pain level controlled Vital Signs Assessment: post-procedure vital signs reviewed and stable Respiratory status: spontaneous breathing, nonlabored ventilation, respiratory function stable and patient connected to nasal cannula oxygen Cardiovascular status: blood pressure returned to baseline and stable Postop Assessment: no apparent nausea or vomiting Anesthetic complications: no    Last Vitals:  Vitals:   08/23/17 1215 08/23/17 1230  BP: (!) 151/73 (!) 156/63  Pulse: 77 73  Resp: 20 (!) 21  Temp: 36.6 C   SpO2: 98% 98%    Last Pain:  Vitals:   08/23/17 1215  TempSrc:   PainSc: 0-No pain                 Montez Hageman

## 2017-08-23 NOTE — Discharge Instructions (Signed)
Flexible Bronchoscopy, Care After These instructions give you information on caring for yourself after your procedure. Your doctor may also give you more specific instructions. Call your doctor if you have any problems or questions after your procedure. Follow these instructions at home:  Do not eat or drink anything for 2 hours after your procedure. If you try to eat or drink before the medicine wears off, food or drink could go into your lungs. You could also burn yourself.  After 2 hours have passed and when you can cough and gag normally, you may eat soft food and drink liquids slowly.  The day after the test, you may eat your normal diet.  You may do your normal activities.  Keep all doctor visits. Get help right away if:  You get more and more short of breath.  You get light-headed.  You feel like you are going to pass out (faint).  You have chest pain.  You have new problems that worry you.  You cough up more than a little blood.  You cough up more blood than before.  Please call our office for any questions or problems. [phone].   This information is not intended to replace advice given to you by your health care provider. Make sure you discuss any questions you have with your health care provider. Document Released: 12/12/2008 Document Revised: 07/23/2015 Document Reviewed: 10/19/2012 Elsevier Interactive Patient Education  2017 [employer].

## 2017-08-23 NOTE — Progress Notes (Signed)
Patient stated she had not taken her blood pressure med for a week because she had placed it in the bag with her vitamins that we had asked her to stop a week prior to surgery. Anesthesia made aware. No new orders received at this time.

## 2017-08-23 NOTE — Anesthesia Preprocedure Evaluation (Signed)
Anesthesia Evaluation  Patient identified by MRN, date of birth, ID band Patient awake    Reviewed: Allergy & Precautions, NPO status , Patient's Chart, lab work & pertinent test results  Airway Mallampati: II  TM Distance: >3 FB Neck ROM: Full    Dental no notable dental hx.    Pulmonary neg pulmonary ROS, former smoker,    Pulmonary exam normal breath sounds clear to auscultation       Cardiovascular hypertension, Pt. on medications Normal cardiovascular exam Rhythm:Regular Rate:Normal     Neuro/Psych negative neurological ROS  negative psych ROS   GI/Hepatic negative GI ROS, Neg liver ROS,   Endo/Other  negative endocrine ROS  Renal/GU negative Renal ROS  negative genitourinary   Musculoskeletal negative musculoskeletal ROS (+)   Abdominal   Peds negative pediatric ROS (+)  Hematology negative hematology ROS (+)   Anesthesia Other Findings   Reproductive/Obstetrics negative OB ROS                             Anesthesia Physical Anesthesia Plan  ASA: II  Anesthesia Plan: General   Post-op Pain Management:    Induction: Intravenous  PONV Risk Score and Plan: 3 and Ondansetron and Treatment may vary due to age or medical condition  Airway Management Planned: Oral ETT  Additional Equipment:   Intra-op Plan:   Post-operative Plan: Extubation in OR  Informed Consent: I have reviewed the patients History and Physical, chart, labs and discussed the procedure including the risks, benefits and alternatives for the proposed anesthesia with the patient or authorized representative who has indicated his/her understanding and acceptance.   Dental advisory given  Plan Discussed with: CRNA  Anesthesia Plan Comments:         Anesthesia Quick Evaluation

## 2017-08-23 NOTE — Interval H&P Note (Signed)
PCCM Interval Note  84 year old former smoker being followed by Dr. Chase Caller for an evolving left upper lobe nodule.  Her most recent imaging showed that the nodule was slightly larger in size, had a more solid component than priors.  She also had a new right upper lobe nodule.  PET scan was performed and showed mild hypermetabolism in both locations.  She presents today for further evaluation.  Denies any new problems, any new medications.  She is asymptomatic without much cough or dyspnea.  Vitals:   08/23/17 0807 08/23/17 0809 08/23/17 0859  BP: (!) 188/62 (!) 172/67   Pulse: 64    Resp: 20    Temp: 97.6 F (36.4 C)    TempSrc: Oral    SpO2: 98%    Weight:   103 kg (227 lb)  Height:   5' 5"  (1.651 m)   Gen: Pleasant, obese woman, in no distress,  normal affect  ENT: No lesions,  mouth clear,  oropharynx clear, no postnasal drip, M 3-4 airway  Neck: No JVD, no stridor  Lungs: No use of accessory muscles, no dullness to percussion, clear without rales or rhonchi  Cardiovascular: RRR, heart sounds normal, no murmur or gallops, no peripheral edema  Abdomen: soft and NT, no HSM,  BS normal  Musculoskeletal: No deformities, no cyanosis or clubbing  Neuro: alert, non focal  Skin: Warm, no lesions or rashes   PLan:  She has bilateral upper lobe nodules that are mildly hypermetabolic on PET scan.  Discussed the procedure with her today.  We will pursue navigational bronchoscopy and attempt to biopsy first the left upper lobe nodule.  If the procedure is going smoothly then I will try to approach the right upper lobe nodule as well.  I explained the risks, benefits with her today.  She understands the procedure and all questions answered.  She agrees to proceed as planned.  Baltazar Apo, MD, PhD 08/23/2017, 9:40 AM [facility] [phone] or if no answer [phone]

## 2017-08-23 NOTE — Anesthesia Procedure Notes (Signed)
Procedure Name: Intubation Date/Time: 08/23/2017 9:54 AM Performed by: Montez Hageman, MD Pre-anesthesia Checklist: Patient identified, Emergency Drugs available, Suction available and Timeout performed Patient Re-evaluated:Patient Re-evaluated prior to induction Oxygen Delivery Method: Circle system utilized Preoxygenation: Pre-oxygenation with 100% oxygen Induction Type: IV induction Ventilation: Mask ventilation without difficulty and Oral airway inserted - appropriate to patient size Laryngoscope Size: Sabra Heck and 2 Grade View: Grade I Tube type: Oral Tube size: 8.5 mm Number of attempts: 1 Airway Equipment and Method: Patient positioned with wedge pillow and Stylet Placement Confirmation: ETT inserted through vocal cords under direct vision,  positive ETCO2 and breath sounds checked- equal and bilateral Secured at: 21 cm Tube secured with: Tape Dental Injury: Teeth and Oropharynx as per pre-operative assessment

## 2017-08-24 ENCOUNTER — Encounter (HOSPITAL_COMMUNITY): Payer: Self-pay | Admitting: Emergency Medicine

## 2017-08-24 ENCOUNTER — Telehealth: Payer: Self-pay | Admitting: Internal Medicine

## 2017-08-24 LAB — ACID FAST SMEAR (AFB): ACID FAST SMEAR - AFSCU2: NEGATIVE

## 2017-08-24 LAB — ACID FAST SMEAR (AFB, MYCOBACTERIA): Acid Fast Smear: NEGATIVE

## 2017-08-24 NOTE — Telephone Encounter (Signed)
That is fine. Given nodules is more appropriate anyways

## 2017-08-24 NOTE — Telephone Encounter (Signed)
Pt is requesting to change from MR to RB---please advise if you are ok with this change.  Pt is aware of office protocol.

## 2017-08-24 NOTE — Telephone Encounter (Signed)
Called patient, unable to reach left message to give us a call back. 

## 2017-08-24 NOTE — Telephone Encounter (Signed)
Will await MR response, MR please advise, thank you.

## 2017-08-24 NOTE — Telephone Encounter (Signed)
OK with me.

## 2017-08-25 ENCOUNTER — Telehealth: Payer: Self-pay | Admitting: Emergency Medicine

## 2017-08-25 LAB — CULTURE, RESPIRATORY: CULTURE: NO GROWTH

## 2017-08-25 LAB — CULTURE, RESPIRATORY W GRAM STAIN: Culture: NORMAL

## 2017-08-25 NOTE — Telephone Encounter (Signed)
Spoke with the patient and reviewed biopsy results - all negative. We will plan to do CT surveillance, let that guide any further bx plans.

## 2017-08-25 NOTE — Telephone Encounter (Signed)
Ok with me 

## 2017-08-25 NOTE — Telephone Encounter (Signed)
Called and spoke with patient, she is requesting results from Biopsy.    RB please advise, thank you.

## 2017-08-25 NOTE — Telephone Encounter (Signed)
Dr. Lamonte Sakai, Please advise on taking on patient from MR. Thanks.

## 2017-08-27 ENCOUNTER — Other Ambulatory Visit: Payer: Self-pay | Admitting: Internal Medicine

## 2017-08-28 MED ORDER — FLUTICASONE FUROATE 100 MCG/ACT IN AEPB: 1.0000 | INHALATION_SPRAY | Freq: Every day | RESPIRATORY_TRACT | 0 refills | Status: DC

## 2017-08-28 NOTE — Telephone Encounter (Signed)
Pt is calling back [phone]

## 2017-08-28 NOTE — Telephone Encounter (Signed)
Pt has been scheduled with RB on 10/06/17 at 2:00. Pt also requested Rx for Arnuity 100. Rx has been sent to preferred pharmacy. Nothing further is needed.

## 2017-08-28 NOTE — Telephone Encounter (Signed)
Attempted to call pt. I did not receive an answer. I have left a message for pt to return our call.  

## 2017-08-29 DIAGNOSIS — H6982 Other specified disorders of Eustachian tube, left ear: Secondary | ICD-10-CM | POA: Diagnosis not present

## 2017-08-29 DIAGNOSIS — H903 Sensorineural hearing loss, bilateral: Secondary | ICD-10-CM | POA: Diagnosis not present

## 2017-09-01 DIAGNOSIS — H6982 Other specified disorders of Eustachian tube, left ear: Secondary | ICD-10-CM | POA: Diagnosis not present

## 2017-09-05 DIAGNOSIS — H02132 Senile ectropion of right lower eyelid: Secondary | ICD-10-CM | POA: Diagnosis not present

## 2017-09-05 DIAGNOSIS — H02135 Senile ectropion of left lower eyelid: Secondary | ICD-10-CM | POA: Diagnosis not present

## 2017-09-05 DIAGNOSIS — H401121 Primary open-angle glaucoma, left eye, mild stage: Secondary | ICD-10-CM | POA: Diagnosis not present

## 2017-09-05 DIAGNOSIS — H401113 Primary open-angle glaucoma, right eye, severe stage: Secondary | ICD-10-CM | POA: Diagnosis not present

## 2017-09-05 DIAGNOSIS — H10523 Angular blepharoconjunctivitis, bilateral: Secondary | ICD-10-CM | POA: Diagnosis not present

## 2017-09-05 DIAGNOSIS — H16141 Punctate keratitis, right eye: Secondary | ICD-10-CM | POA: Diagnosis not present

## 2017-09-05 DIAGNOSIS — Z961 Presence of intraocular lens: Secondary | ICD-10-CM | POA: Diagnosis not present

## 2017-09-05 DIAGNOSIS — H353130 Nonexudative age-related macular degeneration, bilateral, stage unspecified: Secondary | ICD-10-CM | POA: Diagnosis not present

## 2017-09-21 LAB — FUNGUS CULTURE WITH STAIN

## 2017-09-21 LAB — FUNGUS CULTURE RESULT

## 2017-09-21 LAB — FUNGAL ORGANISM REFLEX

## 2017-10-02 ENCOUNTER — Ambulatory Visit: Payer: PPO | Admitting: Internal Medicine

## 2017-10-03 DIAGNOSIS — Z0000 Encounter for general adult medical examination without abnormal findings: Secondary | ICD-10-CM | POA: Diagnosis not present

## 2017-10-03 DIAGNOSIS — Z125 Encounter for screening for malignant neoplasm of prostate: Secondary | ICD-10-CM | POA: Diagnosis not present

## 2017-10-03 DIAGNOSIS — M810 Age-related osteoporosis without current pathological fracture: Secondary | ICD-10-CM | POA: Diagnosis not present

## 2017-10-03 DIAGNOSIS — R82998 Other abnormal findings in urine: Secondary | ICD-10-CM | POA: Diagnosis not present

## 2017-10-03 DIAGNOSIS — E1169 Type 2 diabetes mellitus with other specified complication: Secondary | ICD-10-CM | POA: Diagnosis not present

## 2017-10-04 DIAGNOSIS — H7202 Central perforation of tympanic membrane, left ear: Secondary | ICD-10-CM | POA: Diagnosis not present

## 2017-10-04 DIAGNOSIS — H903 Sensorineural hearing loss, bilateral: Secondary | ICD-10-CM | POA: Diagnosis not present

## 2017-10-04 DIAGNOSIS — H6982 Other specified disorders of Eustachian tube, left ear: Secondary | ICD-10-CM | POA: Diagnosis not present

## 2017-10-05 LAB — ACID FAST CULTURE WITH REFLEXED SENSITIVITIES: ACID FAST CULTURE - AFSCU3: NEGATIVE

## 2017-10-05 LAB — ACID FAST CULTURE WITH REFLEXED SENSITIVITIES (MYCOBACTERIA): Acid Fast Culture: NEGATIVE

## 2017-10-06 ENCOUNTER — Ambulatory Visit (INDEPENDENT_AMBULATORY_CARE_PROVIDER_SITE_OTHER): Payer: PPO | Admitting: Emergency Medicine

## 2017-10-06 ENCOUNTER — Encounter: Payer: Self-pay | Admitting: Emergency Medicine

## 2017-10-06 DIAGNOSIS — R918 Other nonspecific abnormal finding of lung field: Secondary | ICD-10-CM | POA: Diagnosis not present

## 2017-10-06 DIAGNOSIS — J45991 Cough variant asthma: Secondary | ICD-10-CM

## 2017-10-06 NOTE — Patient Instructions (Signed)
We will do a trial off of Arnuity. Keep track of whether your symptoms change off the medication, especially cough, shortness of breath.  We will plan to repeat your CT chest in December 2019 to follow your pulmonary nodules.  Follow with Dr Lamonte Sakai in December

## 2017-10-06 NOTE — Assessment & Plan Note (Signed)
Her nodules are suspicious for slow-growing malignancy especially given her smoking history.  Navigational bronchoscopy was unrevealing.  At this point we will undertake surveillance.  Next CT scan of the chest to be done in December 2019.  We will follow-up to review after that study.

## 2017-10-06 NOTE — Assessment & Plan Note (Signed)
Mild obstruction noted on her pulmonary function testing without a bronchodilator response.  She been given a diagnosis of cough variant asthma.  She is currently asymptomatic.  Unclear whether this is because she is being well-controlled on Arnuity or whether her cough was a more acute flare.  She is willing to do a trial off the Arnuity to see if she loses any ground.  She will let me know if she has any change in her functional capacity, breathing, coughing.  If so we would consider restarting an ICS.  She has albuterol available but does not ever need it.

## 2017-10-06 NOTE — Progress Notes (Signed)
Subjective:    Patient ID: Ashley Parker, female    DOB: 12/28/1931, 84 y.o.   MRN: [id number]  HPI 84 year old woman with a history of tobacco use (32 pack years), hypertension, hiatal hernia, GERD, esophageal strictures.  She has a history of chronic cough and mild obstruction/asthma documented by pulmonary function testing from 07/07/2014 which I have reviewed.  She also has a history of pulmonary nodules that have been followed with serial imaging.  On a repeat scan from 07/04/2017 there been an interval development of an 8 x 12 mm irregular soft tissue nodule in the right upper lobe that he previously been more groundglass.  Her posterior left upper lobe lesion also increased in size to 10 mm.  A PET scan done 08/02/2017 showed mild hypermetabolism in the right upper lobe nodule as well as the slowly enlarging solid component in the left upper lobe nodule.  There were no metastatic lesions seen.  She underwent navigational bronchoscopy on 08/23/2017 with biopsies and brushings of both upper lobe nodules.  All the cytology and pathology was negative for malignancy.  Culture data is also negative.  Currently managed on Arnuity.  She uses albuterol that she rarely uses. No wheeze, she is very active. She is not having cough right now, based on my review of the notes that she is to be much more bothersome problem.    Review of Systems  Past Medical History:  Diagnosis Date  . Arthritis   . Basal cell carcinoma (BCC) of skin of face 2016   removed in 2016  . Cough variant asthma 01/20/2015  . Crohn's ileitis ([place])    Mild, mostly asymptomatic, not on therapy  . Fundic gland polyposis of stomach   . GERD (gastroesophageal reflux disease)   . Glaucoma   . Hemorrhoids    Internal and External  . History of hiatal hernia   . Hypertension   . Macular degeneration   . Obesity, unspecified   . Osteoporosis   . Pelvic fracture ([place]) 2007  . Stricture of esophagus    distal  . Unspecified  vitamin D deficiency   . Wrist fracture, left 1943, 1985     Family History  Problem Relation Age of Onset  . Prostate cancer Father   . Breast cancer Mother   . Heart disease Mother   . Uterine cancer Mother   . Colon cancer Neg Hx      Social History   Socioeconomic History  . Marital status: Married    Spouse name: Herbie [place]  . Number of children: 5  . Years of education: 6  . Highest education level: Not on file  Occupational History  . Occupation: Retired  Scientific laboratory technician  . Financial resource strain: Not on file  . Food insecurity:    Worry: Not on file    Inability: Not on file  . Transportation needs:    Medical: Not on file    Non-medical: Not on file  Tobacco Use  . Smoking status: Former Smoker    Packs/day: 1.00    Years: 32.00    Pack years: 32.00    Types: Cigarettes    Last attempt to quit: 02/18/1981    Years since quitting: 36.6  . Smokeless tobacco: Never Used  Substance and Sexual Activity  . Alcohol use: Yes    Alcohol/week: 0.0 standard drinks    Comment: 1 drink daily   . Drug use: No  . Sexual activity: Not Currently  Lifestyle  .  Physical activity:    Days per week: Not on file    Minutes per session: Not on file  . Stress: Not on file  Relationships  . Social connections:    Talks on phone: Not on file    Gets together: Not on file    Attends religious service: Not on file    Active member of club or organization: Not on file    Attends meetings of clubs or organizations: Not on file    Relationship status: Not on file  . Intimate partner violence:    Fear of current or ex partner: Not on file    Emotionally abused: Not on file    Physically abused: Not on file    Forced sexual activity: Not on file  Other Topics Concern  . Not on file  Social History Narrative   Regular exercise-yes - aqua aerobics, walking 1hr x 5 each week   Married   5 children, 22 grandchildren, worked in past as garden Building services engineer, Education officer, community,  Electrical engineer   Daily caffeine x 2     Allergies  Allergen Reactions  . Wasp Venom Anaphylaxis and Other (See Comments)    Severe pain in lower stomach, sweatiness, and BP drops low   . Biaxin [Clarithromycin] Other (See Comments)    MYALGIAS ACHES  . Brimonidine Dermatitis and Other (See Comments)    Redness/Peeling of skin     Outpatient Medications Prior to Visit  Medication Sig Dispense Refill  . Albuterol Sulfate (PROAIR RESPICLICK IN) Inhale 1-2 puffs into the lungs every 6 (six) hours as needed (for wheezing/shortness of breath.).     [name] ARNUITY ELLIPTA 100 MCG/ACT AEPB Inhale 1 puff by mouth daily 90 each 0  . Ascorbic Acid (VITAMIN C) 500 MG CHEW Chew 500 mg by mouth daily.    . B Complex-C (SUPER B COMPLEX PO) Take 1 tablet by mouth daily.    . Calcium Carb-Cholecalciferol (CALCIUM 600+D3 PO) Take 1-2 tablets by mouth See admin instructions. Take 1 tablet in the morning & 2 tablet at night    . calcium elemental as carbonate (BARIATRIC TUMS ULTRA) 400 MG chewable tablet Chew 2,000 mg by mouth at bedtime.    . Cholecalciferol (VITAMIN D3) 2000 units TABS Take 2,000 Units by mouth daily.    . dorzolamide (TRUSOPT) 2 % ophthalmic solution Place 1 drop into both eyes 2 (two) times daily.    [name] EPINEPHrine 0.3 mg/0.3 mL IJ SOAJ injection Inject 0.3 mg into the muscle as needed (for allergic reaction).    . Flaxseed, Linseed, (FLAXSEED OIL PO) Take 1 tablet by mouth daily.    . fluticasone (FLONASE) 50 MCG/ACT nasal spray Place 1 spray into both nostrils daily as needed for allergies.     [name] irbesartan (AVAPRO) 300 MG tablet Take 300 mg by mouth daily.     [name] ketotifen (ZADITOR) 0.025 % ophthalmic solution Place 1 drop into both eyes 2 (two) times daily as needed (for allergy eyes.).    [name] LUMIGAN 0.01 % SOLN Place 1 drop into both eyes at bedtime.  4  . Magnesium 400 MG TABS Take 400 mg by mouth daily.    . Multiple Vitamins-Minerals (ICAPS AREDS 2 PO) Take 2 capsules by mouth daily.  VISION IF02    . Omega-3 Fatty Acids (FISH OIL) 1200 MG CAPS Take 1,200 mg by mouth daily.    Vladimir Faster Glycol-Propyl Glycol (SYSTANE) 0.4-0.3 % SOLN Place 1 drop into both eyes 3 (three)  times daily as needed (for dry eyes.).    [name] timolol (BETIMOL) 0.5 % ophthalmic solution Place 1 drop into both eyes 2 (two) times daily.    . Turmeric 450 MG CAPS Take 450 mg by mouth daily.     . vitamin B-12 (CYANOCOBALAMIN) 1000 MCG tablet Take 1,000 mcg by mouth daily.    . vitamin E 400 UNIT capsule Take 400 Units by mouth daily. (180 mg)    . Fluticasone Furoate (ARNUITY ELLIPTA) 100 MCG/ACT AEPB Inhale 1 puff into the lungs daily. 90 each 0   No facility-administered medications prior to visit.         Objective:   Physical Exam Vitals:   10/06/17 1355  BP: 134/80  Pulse: 74  SpO2: 96%  Weight: 229 lb (103.9 kg)  Height: 5' 5"  (1.651 m)   Gen: Pleasant, obese, in no distress,  normal affect  ENT: No lesions,  mouth clear,  oropharynx clear, no postnasal drip  Neck: No JVD, no stridor  Lungs: No use of accessory muscles, no wheeze or crackles  Cardiovascular: RRR, heart sounds normal, no murmur or gallops, no peripheral edema  Musculoskeletal: No deformities, no cyanosis or clubbing  Neuro: alert, non focal  Skin: Warm, no lesions or rash    08/23/17 --  COMPARISON:  PET 08/02/2017 and CT chest exams dating back to 01/21/2009.  FINDINGS: Cardiovascular: Atherosclerotic calcification of the arterial vasculature, including three-vessel involvement of the coronary arteries. Heart size normal. No pericardial effusion.  Mediastinum/Nodes: No pathologically enlarged mediastinal or axillary lymph nodes. Hilar regions are difficult to evaluate without IV contrast but appear grossly unremarkable. Esophagus is grossly unremarkable. Small to moderate hiatal hernia.  Lungs/Pleura: Mixed solid and ground-glass nodule in the left upper lobe overall measures 1.9 x 2.0 cm with an  internal solid component measuring 1.0 x 1.3 cm, similar to recent prior exams but showing slow growth from remote prior exams. Mixed solid and ground-glass nodule in the posterior segment right upper lobe is again seen with the solid component measuring 11 mm, new from 2017. Lesion overall measures 1.8 x 2.6 cm. Mild scarring in both lower lobes, adjacent to the hemidiaphragms. No pleural fluid. Airway is unremarkable.  Upper Abdomen: Visualized portion of the liver is decreased in attenuation diffusely. Faint stones in the gallbladder. Right adrenal gland is unremarkable. Fluid density 1.4 cm left adrenal nodule, as before. Visualized portions of the kidneys, spleen, pancreas, stomach and bowel are grossly unremarkable. Periportal and abdominal peritoneal ligament lymph nodes measure up to 1.3 cm.  Musculoskeletal: Degenerative changes in the spine. No worrisome lytic or sclerotic lesions.  IMPRESSION: 1. Mixed solid and ground-glass nodules in the upper lobes bilaterally, shown to be hypermetabolic on recent PET and highly worrisome for adenocarcinoma. 2. Aortic atherosclerosis (ICD10-170.0). Three-vessel coronary artery calcification. 3. Hepatic steatosis. 4. Left adrenal adenoma.      Assessment & Plan:  Cough variant asthma Mild obstruction noted on her pulmonary function testing without a bronchodilator response.  She been given a diagnosis of cough variant asthma.  She is currently asymptomatic.  Unclear whether this is because she is being well-controlled on Arnuity or whether her cough was a more acute flare.  She is willing to do a trial off the Arnuity to see if she loses any ground.  She will let me know if she has any change in her functional capacity, breathing, coughing.  If so we would consider restarting an ICS.  She has albuterol available but does  not ever need it.  Pulmonary nodules Her nodules are suspicious for slow-growing malignancy especially given her  smoking history.  Navigational bronchoscopy was unrevealing.  At this point we will undertake surveillance.  Next CT scan of the chest to be done in December 2019.  We will follow-up to review after that study.  Baltazar Apo, MD, PhD 10/06/2017, 2:16 PM [facility] [phone] or if no answer [phone]

## 2017-10-10 DIAGNOSIS — J45991 Cough variant asthma: Secondary | ICD-10-CM | POA: Diagnosis not present

## 2017-10-10 DIAGNOSIS — Z0000 Encounter for general adult medical examination without abnormal findings: Secondary | ICD-10-CM | POA: Diagnosis not present

## 2017-10-10 DIAGNOSIS — E1169 Type 2 diabetes mellitus with other specified complication: Secondary | ICD-10-CM | POA: Diagnosis not present

## 2017-10-10 DIAGNOSIS — R748 Abnormal levels of other serum enzymes: Secondary | ICD-10-CM | POA: Diagnosis not present

## 2017-10-10 DIAGNOSIS — Z6837 Body mass index (BMI) 37.0-37.9, adult: Secondary | ICD-10-CM | POA: Diagnosis not present

## 2017-10-10 DIAGNOSIS — Z1389 Encounter for screening for other disorder: Secondary | ICD-10-CM | POA: Diagnosis not present

## 2017-10-10 DIAGNOSIS — R918 Other nonspecific abnormal finding of lung field: Secondary | ICD-10-CM | POA: Diagnosis not present

## 2017-10-10 DIAGNOSIS — F438 Other reactions to severe stress: Secondary | ICD-10-CM | POA: Diagnosis not present

## 2017-10-10 DIAGNOSIS — K5090 Crohn's disease, unspecified, without complications: Secondary | ICD-10-CM | POA: Diagnosis not present

## 2017-10-10 DIAGNOSIS — E7849 Other hyperlipidemia: Secondary | ICD-10-CM | POA: Diagnosis not present

## 2017-10-10 DIAGNOSIS — I5030 Unspecified diastolic (congestive) heart failure: Secondary | ICD-10-CM | POA: Diagnosis not present

## 2017-10-13 DIAGNOSIS — Z1212 Encounter for screening for malignant neoplasm of rectum: Secondary | ICD-10-CM | POA: Diagnosis not present

## 2017-10-17 DIAGNOSIS — M810 Age-related osteoporosis without current pathological fracture: Secondary | ICD-10-CM | POA: Diagnosis not present

## 2017-11-16 DIAGNOSIS — Z23 Encounter for immunization: Secondary | ICD-10-CM | POA: Diagnosis not present

## 2017-12-06 DIAGNOSIS — H02132 Senile ectropion of right lower eyelid: Secondary | ICD-10-CM | POA: Diagnosis not present

## 2017-12-06 DIAGNOSIS — H401121 Primary open-angle glaucoma, left eye, mild stage: Secondary | ICD-10-CM | POA: Diagnosis not present

## 2017-12-06 DIAGNOSIS — Z961 Presence of intraocular lens: Secondary | ICD-10-CM | POA: Diagnosis not present

## 2017-12-06 DIAGNOSIS — H353131 Nonexudative age-related macular degeneration, bilateral, early dry stage: Secondary | ICD-10-CM | POA: Diagnosis not present

## 2017-12-06 DIAGNOSIS — H02135 Senile ectropion of left lower eyelid: Secondary | ICD-10-CM | POA: Diagnosis not present

## 2017-12-06 DIAGNOSIS — H401113 Primary open-angle glaucoma, right eye, severe stage: Secondary | ICD-10-CM | POA: Diagnosis not present

## 2017-12-06 DIAGNOSIS — H16141 Punctate keratitis, right eye: Secondary | ICD-10-CM | POA: Diagnosis not present

## 2017-12-06 DIAGNOSIS — H10523 Angular blepharoconjunctivitis, bilateral: Secondary | ICD-10-CM | POA: Diagnosis not present

## 2018-02-06 ENCOUNTER — Ambulatory Visit (INDEPENDENT_AMBULATORY_CARE_PROVIDER_SITE_OTHER)
Admission: RE | Admit: 2018-02-06 | Discharge: 2018-02-06 | Disposition: A | Discharge status: Home / Self Care | Payer: PPO | Source: Ambulatory Visit | Attending: Emergency Medicine | Admitting: Emergency Medicine

## 2018-02-06 DIAGNOSIS — R918 Other nonspecific abnormal finding of lung field: Secondary | ICD-10-CM | POA: Diagnosis not present

## 2018-02-13 ENCOUNTER — Encounter: Payer: Self-pay | Admitting: Emergency Medicine

## 2018-02-13 ENCOUNTER — Ambulatory Visit (INDEPENDENT_AMBULATORY_CARE_PROVIDER_SITE_OTHER): Payer: PPO | Admitting: Emergency Medicine

## 2018-02-13 VITALS — BP 144/98 | HR 74 | Ht 65.5 in | Wt 230.4 lb

## 2018-02-13 DIAGNOSIS — R918 Other nonspecific abnormal finding of lung field: Secondary | ICD-10-CM

## 2018-02-13 DIAGNOSIS — J45991 Cough variant asthma: Secondary | ICD-10-CM

## 2018-02-13 NOTE — Progress Notes (Signed)
Subjective:    Patient ID: Ashley Parker, female    DOB: 06/22/1931, 84 y.o.   MRN: [id number]  HPI 84 year old woman with a history of tobacco use (32 pack years), hypertension, hiatal hernia, GERD, esophageal strictures.  She has a history of chronic cough and mild obstruction/asthma documented by pulmonary function testing from 07/07/2014 which I have reviewed.  She also has a history of pulmonary nodules that have been followed with serial imaging.  On a repeat scan from 07/04/2017 there been an interval development of an 8 x 12 mm irregular soft tissue nodule in the right upper lobe that he previously been more groundglass.  Her posterior left upper lobe lesion also increased in size to 10 mm.  A PET scan done 08/02/2017 showed mild hypermetabolism in the right upper lobe nodule as well as the slowly enlarging solid component in the left upper lobe nodule.  There were no metastatic lesions seen.  She underwent navigational bronchoscopy on 08/23/2017 with biopsies and brushings of both upper lobe nodules.  All the cytology and pathology was negative for malignancy.  Culture data is also negative.  Currently managed on Arnuity.  She uses albuterol that she rarely uses. No wheeze, she is very active. She is not having cough right now, based on my review of the notes that she is to be much more bothersome problem.  ROV 02/13/18 --this a follow-up visit for Ashley Parker.  She has a history of tobacco use, chronic cough and mild obstructive lung disease noted on her pulmonary function testing.  She is currently managed on Arnuity, rarely uses albuterol.  We have followed some evolving pulmonary nodules with scanning which prompted navigational bronchoscopy 08/23/2017.  Brushings of both the right upper lobe and left upper lobe nodule were negative for malignancy.  She returns now after a repeat CT scan of the chest that was done 02/06/2018.  This shows significant increase in size of the solid component of a  right upper lobe nodule which had previously had a more groundglass appearance.  The left upper lobe nodule has not significantly changed. She is breathing well, no cough or sputum.     Review of Systems  Past Medical History:  Diagnosis Date  . Arthritis   . Basal cell carcinoma (BCC) of skin of face 2016   removed in 2016  . Cough variant asthma 01/20/2015  . Crohn's ileitis ([place])    Mild, mostly asymptomatic, not on therapy  . Fundic gland polyposis of stomach   . GERD (gastroesophageal reflux disease)   . Glaucoma   . Hemorrhoids    Internal and External  . History of hiatal hernia   . Hypertension   . Macular degeneration   . Obesity, unspecified   . Osteoporosis   . Pelvic fracture ([place]) 2007  . Stricture of esophagus    distal  . Unspecified vitamin D deficiency   . Wrist fracture, left 1943, 1985     Family History  Problem Relation Age of Onset  . Prostate cancer Father   . Breast cancer Mother   . Heart disease Mother   . Uterine cancer Mother   . Colon cancer Neg Hx      Social History   Socioeconomic History  . Marital status: Married    Spouse name: Herbie [place]  . Number of children: 5  . Years of education: 30  . Highest education level: Not on file  Occupational History  . Occupation: Retired  Scientific laboratory technician  .  Financial resource strain: Not on file  . Food insecurity:    Worry: Not on file    Inability: Not on file  . Transportation needs:    Medical: Not on file    Non-medical: Not on file  Tobacco Use  . Smoking status: Former Smoker    Packs/day: 1.00    Years: 32.00    Pack years: 32.00    Types: Cigarettes    Last attempt to quit: 02/18/1981    Years since quitting: 37.0  . Smokeless tobacco: Never Used  Substance and Sexual Activity  . Alcohol use: Yes    Alcohol/week: 0.0 standard drinks    Comment: 1 drink daily   . Drug use: No  . Sexual activity: Not Currently  Lifestyle  . Physical activity:    Days per week: Not on file      Minutes per session: Not on file  . Stress: Not on file  Relationships  . Social connections:    Talks on phone: Not on file    Gets together: Not on file    Attends religious service: Not on file    Active member of club or organization: Not on file    Attends meetings of clubs or organizations: Not on file    Relationship status: Not on file  . Intimate partner violence:    Fear of current or ex partner: Not on file    Emotionally abused: Not on file    Physically abused: Not on file    Forced sexual activity: Not on file  Other Topics Concern  . Not on file  Social History Narrative   Regular exercise-yes - aqua aerobics, walking 1hr x 5 each week   Married   5 children, 22 grandchildren, worked in past as garden Building services engineer, Education officer, community, Electrical engineer   Daily caffeine x 2     Allergies  Allergen Reactions  . Wasp Venom Anaphylaxis and Other (See Comments)    Severe pain in lower stomach, sweatiness, and BP drops low   . Biaxin [Clarithromycin] Other (See Comments)    MYALGIAS ACHES  . Brimonidine Dermatitis and Other (See Comments)    Redness/Peeling of skin     Outpatient Medications Prior to Visit  Medication Sig Dispense Refill  . Albuterol Sulfate (PROAIR RESPICLICK IN) Inhale 1-2 puffs into the lungs every 6 (six) hours as needed (for wheezing/shortness of breath.).     [name] Ascorbic Acid (VITAMIN C) 500 MG CHEW Chew 500 mg by mouth daily.    . B Complex-C (SUPER B COMPLEX PO) Take 1 tablet by mouth daily.    . Calcium Carb-Cholecalciferol (CALCIUM 600+D3 PO) Take 1-2 tablets by mouth See admin instructions. Take 1 tablet in the morning & 2 tablet at night    . calcium elemental as carbonate (BARIATRIC TUMS ULTRA) 400 MG chewable tablet Chew 2,000 mg by mouth at bedtime.    . Cholecalciferol (VITAMIN D3) 2000 units TABS Take 2,000 Units by mouth daily.    . dorzolamide (TRUSOPT) 2 % ophthalmic solution Place 1 drop into both eyes 2 (two) times daily.    [name]  EPINEPHrine 0.3 mg/0.3 mL IJ SOAJ injection Inject 0.3 mg into the muscle as needed (for allergic reaction).    . Flaxseed, Linseed, (FLAXSEED OIL PO) Take 1 tablet by mouth daily.    . fluticasone (FLONASE) 50 MCG/ACT nasal spray Place 1 spray into both nostrils daily as needed for allergies.     [name] irbesartan (AVAPRO) 300 MG  tablet Take 300 mg by mouth daily.     [name] ketotifen (ZADITOR) 0.025 % ophthalmic solution Place 1 drop into both eyes 2 (two) times daily as needed (for allergy eyes.).    [name] LUMIGAN 0.01 % SOLN Place 1 drop into both eyes at bedtime.  4  . Magnesium 400 MG TABS Take 400 mg by mouth daily.    . Multiple Vitamins-Minerals (ICAPS AREDS 2 PO) Take 2 capsules by mouth daily. VISION MC94    . Omega-3 Fatty Acids (FISH OIL) 1200 MG CAPS Take 1,200 mg by mouth daily.    Vladimir Faster Glycol-Propyl Glycol (SYSTANE) 0.4-0.3 % SOLN Place 1 drop into both eyes 3 (three) times daily as needed (for dry eyes.).    [name] timolol (BETIMOL) 0.5 % ophthalmic solution Place 1 drop into both eyes 2 (two) times daily.    . Turmeric 450 MG CAPS Take 450 mg by mouth daily.     . vitamin B-12 (CYANOCOBALAMIN) 1000 MCG tablet Take 1,000 mcg by mouth daily.    . vitamin E 400 UNIT capsule Take 400 Units by mouth daily. (180 mg)    . ARNUITY ELLIPTA 100 MCG/ACT AEPB Inhale 1 puff by mouth daily 90 each 0   No facility-administered medications prior to visit.         Objective:   Physical Exam Vitals:   02/13/18 0858  BP: (!) 144/98  Pulse: 74  SpO2: 98%  Weight: 230 lb 6.4 oz (104.5 kg)  Height: 5' 5.5" (1.664 m)   Gen: Pleasant, obese, in no distress,  normal affect  ENT: No lesions,  mouth clear,  oropharynx clear, no postnasal drip  Neck: No JVD, no stridor  Lungs: No use of accessory muscles, no wheeze or crackles  Cardiovascular: RRR, heart sounds normal, no murmur or gallops, no peripheral edema  Musculoskeletal: No deformities, no cyanosis or clubbing  Neuro: alert, non  focal  Skin: Warm, no lesions or rash      Assessment & Plan:  Pulmonary nodules The left upper lobe nodule is stable in size.  The right upper lobe nodule has a larger solid component.  My suspicion for malignancy remains moderate to high.  I recommended that we perform repeat tissue sampling, probably navigational bronchoscopy.  At this time she has so much going on in her life including an eye surgery, a lot of care that she has to give to her husband who has dementia and also prostate cancer.  She does not think she can concentrate on this right now.  I have asked her to consider going forward in the meantime we will repeat her CT scan of the chest in 3 months.  Depending on our index of suspicion at that time and the other things that are going on her life I would like to proceed with navigation and right upper lobe biopsies.  Cough variant asthma Doing well currently on Arnuity, minimal albuterol use.  Continue same  Baltazar Apo, MD, PhD 02/13/2018, 9:24 AM [facility] [phone] or if no answer [phone]

## 2018-02-13 NOTE — Assessment & Plan Note (Signed)
Doing well currently on Arnuity, minimal albuterol use.  Continue same

## 2018-02-13 NOTE — Patient Instructions (Signed)
We discussed your CT scan of the chest.  The right upper lobe nodule has increased some in size which in turn increases our suspicion that it could be a slow-growing malignancy.  We have agreed to repeat your CT scan of the chest in 3 months and if our suspicion remains high then to repeat tissue sampling. Please continue your Arnuity as you have been taking it. Keep albuterol available use 2 puffs if needed for shortness of breath Follow with Ashley Parker in 3 months or sooner if you have any problems.

## 2018-02-13 NOTE — Assessment & Plan Note (Signed)
The left upper lobe nodule is stable in size.  The right upper lobe nodule has a larger solid component.  My suspicion for malignancy remains moderate to high.  I recommended that we perform repeat tissue sampling, probably navigational bronchoscopy.  At this time she has so much going on in her life including an eye surgery, a lot of care that she has to give to her husband who has dementia and also prostate cancer.  She does not think she can concentrate on this right now.  I have asked her to consider going forward in the meantime we will repeat her CT scan of the chest in 3 months.  Depending on our index of suspicion at that time and the other things that are going on her life I would like to proceed with navigation and right upper lobe biopsies.

## 2018-02-15 DIAGNOSIS — H401113 Primary open-angle glaucoma, right eye, severe stage: Secondary | ICD-10-CM | POA: Diagnosis not present

## 2018-03-14 DIAGNOSIS — Z6837 Body mass index (BMI) 37.0-37.9, adult: Secondary | ICD-10-CM | POA: Diagnosis not present

## 2018-03-14 DIAGNOSIS — R918 Other nonspecific abnormal finding of lung field: Secondary | ICD-10-CM | POA: Diagnosis not present

## 2018-03-14 DIAGNOSIS — J45991 Cough variant asthma: Secondary | ICD-10-CM | POA: Diagnosis not present

## 2018-03-14 DIAGNOSIS — E1169 Type 2 diabetes mellitus with other specified complication: Secondary | ICD-10-CM | POA: Diagnosis not present

## 2018-03-14 DIAGNOSIS — R748 Abnormal levels of other serum enzymes: Secondary | ICD-10-CM | POA: Diagnosis not present

## 2018-03-14 DIAGNOSIS — Z1389 Encounter for screening for other disorder: Secondary | ICD-10-CM | POA: Diagnosis not present

## 2018-03-14 DIAGNOSIS — F438 Other reactions to severe stress: Secondary | ICD-10-CM | POA: Diagnosis not present

## 2018-03-14 DIAGNOSIS — I10 Essential (primary) hypertension: Secondary | ICD-10-CM | POA: Diagnosis not present

## 2018-03-15 DIAGNOSIS — Z01419 Encounter for gynecological examination (general) (routine) without abnormal findings: Secondary | ICD-10-CM | POA: Diagnosis not present

## 2018-03-15 DIAGNOSIS — Z6838 Body mass index (BMI) 38.0-38.9, adult: Secondary | ICD-10-CM | POA: Diagnosis not present

## 2018-03-15 DIAGNOSIS — Z1231 Encounter for screening mammogram for malignant neoplasm of breast: Secondary | ICD-10-CM | POA: Diagnosis not present

## 2018-03-27 DIAGNOSIS — F438 Other reactions to severe stress: Secondary | ICD-10-CM | POA: Diagnosis not present

## 2018-05-09 DIAGNOSIS — Z9889 Other specified postprocedural states: Secondary | ICD-10-CM | POA: Diagnosis not present

## 2018-05-09 DIAGNOSIS — H353132 Nonexudative age-related macular degeneration, bilateral, intermediate dry stage: Secondary | ICD-10-CM | POA: Diagnosis not present

## 2018-05-09 DIAGNOSIS — H401133 Primary open-angle glaucoma, bilateral, severe stage: Secondary | ICD-10-CM | POA: Diagnosis not present

## 2018-05-10 ENCOUNTER — Other Ambulatory Visit: Payer: Self-pay

## 2018-05-10 ENCOUNTER — Ambulatory Visit (INDEPENDENT_AMBULATORY_CARE_PROVIDER_SITE_OTHER)
Admission: RE | Admit: 2018-05-10 | Discharge: 2018-05-10 | Disposition: A | Discharge status: Home / Self Care | Payer: PPO | Source: Ambulatory Visit | Attending: Emergency Medicine | Admitting: Emergency Medicine

## 2018-05-10 DIAGNOSIS — R918 Other nonspecific abnormal finding of lung field: Secondary | ICD-10-CM | POA: Diagnosis not present

## 2018-05-15 ENCOUNTER — Telehealth: Payer: Self-pay | Admitting: Emergency Medicine

## 2018-05-15 ENCOUNTER — Ambulatory Visit: Payer: PPO | Admitting: Emergency Medicine

## 2018-05-15 NOTE — Telephone Encounter (Signed)
Please let the patient know that I have looked at her CT scan of the chest.  The pulmonary nodules that we have been following have not changed significantly in size or appearance.  She does have some central lymph nodes that have been enlarged and which are slightly more prominent on this scan.  I am okay with her deferring a follow-up visit for now.  I like for her to arrange to see me in about 6 to 8 weeks and we can review the scan, decide whether any other work-up is indicated.

## 2018-05-15 NOTE — Telephone Encounter (Signed)
Returned phone call to patient, requesting to cancel her appt stating she does not feel comfortable. Patient was assured we are taking extra measures to be sure to keep everyone safe. Patient still declined stating she would rather have her results over the phone.   RB please advise patient of Chest CT results.

## 2018-05-15 NOTE — Telephone Encounter (Signed)
Spoke with the pt and notified of recs per RB  Pt verbalized understanding  Appt scheduled

## 2018-05-22 DIAGNOSIS — I10 Essential (primary) hypertension: Secondary | ICD-10-CM | POA: Diagnosis not present

## 2018-06-04 DIAGNOSIS — N6323 Unspecified lump in the left breast, lower outer quadrant: Secondary | ICD-10-CM | POA: Diagnosis not present

## 2018-06-04 DIAGNOSIS — H353132 Nonexudative age-related macular degeneration, bilateral, intermediate dry stage: Secondary | ICD-10-CM | POA: Diagnosis not present

## 2018-06-04 DIAGNOSIS — Z9889 Other specified postprocedural states: Secondary | ICD-10-CM | POA: Diagnosis not present

## 2018-06-04 DIAGNOSIS — H401133 Primary open-angle glaucoma, bilateral, severe stage: Secondary | ICD-10-CM | POA: Diagnosis not present

## 2018-06-27 ENCOUNTER — Ambulatory Visit: Payer: PPO | Admitting: Emergency Medicine

## 2018-06-29 DIAGNOSIS — R748 Abnormal levels of other serum enzymes: Secondary | ICD-10-CM | POA: Diagnosis not present

## 2018-06-29 DIAGNOSIS — R918 Other nonspecific abnormal finding of lung field: Secondary | ICD-10-CM | POA: Diagnosis not present

## 2018-06-29 DIAGNOSIS — L309 Dermatitis, unspecified: Secondary | ICD-10-CM | POA: Diagnosis not present

## 2018-06-29 DIAGNOSIS — F438 Other reactions to severe stress: Secondary | ICD-10-CM | POA: Diagnosis not present

## 2018-06-29 DIAGNOSIS — J45991 Cough variant asthma: Secondary | ICD-10-CM | POA: Diagnosis not present

## 2018-06-29 DIAGNOSIS — I10 Essential (primary) hypertension: Secondary | ICD-10-CM | POA: Diagnosis not present

## 2018-06-29 DIAGNOSIS — E1169 Type 2 diabetes mellitus with other specified complication: Secondary | ICD-10-CM | POA: Diagnosis not present

## 2018-09-11 ENCOUNTER — Ambulatory Visit (INDEPENDENT_AMBULATORY_CARE_PROVIDER_SITE_OTHER): Payer: PPO | Admitting: Emergency Medicine

## 2018-09-11 ENCOUNTER — Other Ambulatory Visit: Payer: Self-pay

## 2018-09-11 ENCOUNTER — Encounter: Payer: Self-pay | Admitting: Emergency Medicine

## 2018-09-11 DIAGNOSIS — R911 Solitary pulmonary nodule: Secondary | ICD-10-CM | POA: Diagnosis not present

## 2018-09-11 LAB — BASIC METABOLIC PANEL
BUN: 21 mg/dL (ref 6–23)
CO2: 26 mEq/L (ref 19–32)
Calcium: 9.6 mg/dL (ref 8.4–10.5)
Chloride: 105 mEq/L (ref 96–112)
Creatinine, Ser: 0.85 mg/dL (ref 0.40–1.20)
GFR: 63.51 mL/min (ref >60.00)
Glucose, Bld: 151 mg/dL — ABNORMAL HIGH (ref 70–99)
Potassium: 4.1 mEq/L (ref 3.5–5.1)
Sodium: 141 mEq/L (ref 135–145)

## 2018-09-11 NOTE — Assessment & Plan Note (Signed)
Currently off therapy, tolerating

## 2018-09-11 NOTE — Progress Notes (Signed)
Subjective:    Patient ID: Ashley Parker, female    DOB: 1931-10-10, 85 y.o.   MRN: [id number]  HPI 85 year old woman with a history of tobacco use (32 pack years), hypertension, hiatal hernia, GERD, esophageal strictures.  She has a history of chronic cough and mild obstruction/asthma documented by pulmonary function testing from 07/07/2014 which I have reviewed.  She also has a history of pulmonary nodules that have been followed with serial imaging.  On a repeat scan from 07/04/2017 there been an interval development of an 8 x 12 mm irregular soft tissue nodule in the right upper lobe that he previously been more groundglass.  Her posterior left upper lobe lesion also increased in size to 10 mm.  A PET scan done 08/02/2017 showed mild hypermetabolism in the right upper lobe nodule as well as the slowly enlarging solid component in the left upper lobe nodule.  There were no metastatic lesions seen.  She underwent navigational bronchoscopy on 08/23/2017 with biopsies and brushings of both upper lobe nodules.  All the cytology and pathology was negative for malignancy.  Culture data is also negative.  Currently managed on Arnuity.  She uses albuterol that she rarely uses. No wheeze, she is very active. She is not having cough right now, based on my review of the notes that she is to be much more bothersome problem.  ROV 02/13/18 --this a follow-up visit for Ashley Parker.  She has a history of tobacco use, chronic cough and mild obstructive lung disease noted on her pulmonary function testing.  She is currently managed on Arnuity, rarely uses albuterol.  We have followed some evolving pulmonary nodules with scanning which prompted navigational bronchoscopy 08/23/2017.  Brushings of both the right upper lobe and left upper lobe nodule were negative for malignancy.  She returns now after a repeat CT scan of the chest that was done 02/06/2018.  This shows significant increase in size of the solid component of a  right upper lobe nodule which had previously had a more groundglass appearance.  The left upper lobe nodule has not significantly changed. She is breathing well, no cough or sputum.   ROV 09/11/2018 --follow-up visit for 85 year old woman with history of tobacco use and mild COPD.  I been following her principally for pulmonary nodular disease.  A navigational bronchoscopy 6/26 was negative for malignancy.  She had some increased size of the solid component of her right upper lobe nodule on follow-up film 02/06/2018.  This prompted [place] to repeat the CT on 05/10/2018.  That scan showed no substantial change in the left upper lobe or right upper lobe nodules.  She did have some increased mediastinal lymphadenopathy.   Review of Systems       Objective:   Physical Exam Vitals:   09/11/18 1353  BP: 124/72  Pulse: 95  SpO2: 96%  Weight: 234 lb (106.1 kg)  Height: 5' 5"  (1.651 m)   Gen: Pleasant, obese, in no distress,  normal affect  ENT: No lesions,  mouth clear,  oropharynx clear, no postnasal drip  Neck: No JVD, no stridor  Lungs: No use of accessory muscles, no wheeze or crackles  Cardiovascular: RRR, heart sounds normal, no murmur or gallops, no peripheral edema  Musculoskeletal: No deformities, no cyanosis or clubbing  Neuro: alert, non focal  Skin: Warm, no lesions or rash      Assessment & Plan:  Cough variant asthma Currently off therapy, tolerating   Pulmonary nodules The nodules are stable  in size but there is now mediastinal lymphadenopathy that is more prominent.  I am still concerned that this may be slow-growing primary lung cancer(s) that was biopsy negative on her original navigational bronchoscopy.  May need to consider repeat bronchoscopy, endobronchial ultrasound with navigation.Baltazar Apo, MD, PhD 09/11/2018, 2:19 PM [facility] [phone] or if no answer [phone]

## 2018-09-11 NOTE — Assessment & Plan Note (Signed)
The nodules are stable in size but there is now mediastinal lymphadenopathy that is more prominent.  I am still concerned that this may be slow-growing primary lung cancer(s) that was biopsy negative on her original navigational bronchoscopy.  May need to consider repeat bronchoscopy, endobronchial ultrasound with navigation.[name]

## 2018-09-11 NOTE — Patient Instructions (Addendum)
We will plan to repeat your CT scan of the chest with contrast to compare with your prior and to help [place] plan next steps to evaluate your pulmonary nodules and enlarged lymph nodes. Follow with Dr Lamonte Sakai next available after your CT scan has been done to review the results

## 2018-09-12 ENCOUNTER — Telehealth: Payer: Self-pay | Admitting: Emergency Medicine

## 2018-09-12 NOTE — Telephone Encounter (Signed)
Attempted to call Amy with [employer] but unable to reach. Left message for Amy to return call.

## 2018-09-12 NOTE — Telephone Encounter (Signed)
Was able to call and speak with Ashley Parker. She was needing some clarification. Patient's risk for cancer is outside of [facility] testing. Will still run test if provider prefers or we can cancel the test.   Dr. Lamonte Sakai please advise.

## 2018-09-12 NOTE — Telephone Encounter (Signed)
Amy returning your call

## 2018-09-13 NOTE — Telephone Encounter (Signed)
LMTCB with Amy at [facility].

## 2018-09-13 NOTE — Telephone Encounter (Signed)
Please cancel the [facility] test

## 2018-09-14 NOTE — Telephone Encounter (Signed)
LMTCB for Ashley Parker

## 2018-09-17 ENCOUNTER — Telehealth: Payer: Self-pay | Admitting: Emergency Medicine

## 2018-09-17 NOTE — Telephone Encounter (Signed)
Attempted to call Amy with [employer] but unable to reach. Left message for her to return call. I did leave her a detailed message that RB said to cancel the [facility] test but wanted her to call back so we know she had received the message.

## 2018-09-17 NOTE — Telephone Encounter (Signed)
Called and spoke with Patient.  Patient stated she was told she needed labs done before her super D CT w/ contrast, scheduled 09/21/18.  There is not a note or lab order at this time.  Checked with [facility], Judeen Hammans.  Judeen Hammans stated Patient had a BMP 09/11/18, so no other BMP is needed for 09/21/18 super D.   Returned call to Patient. Patient made aware no other labs are needed for super D w/ contrast at this time. Understanding stated.  Nothing further at this time.

## 2018-09-17 NOTE — Telephone Encounter (Signed)
Noted. Nothing further needed. 

## 2018-09-17 NOTE — Telephone Encounter (Signed)
Amy, [facility], is returning call. Per Amy, she is calling back to confirm that the testing for this pt has been canceled. Cb is [phone].

## 2018-09-20 ENCOUNTER — Telehealth: Payer: Self-pay | Admitting: *Deleted

## 2018-09-20 NOTE — Telephone Encounter (Signed)

## 2018-09-21 ENCOUNTER — Ambulatory Visit: Payer: PPO | Admitting: Emergency Medicine

## 2018-09-21 ENCOUNTER — Other Ambulatory Visit: Payer: Self-pay

## 2018-09-21 ENCOUNTER — Ambulatory Visit (INDEPENDENT_AMBULATORY_CARE_PROVIDER_SITE_OTHER)
Admission: RE | Admit: 2018-09-21 | Discharge: 2018-09-21 | Disposition: A | Discharge status: Home / Self Care | Payer: PPO | Source: Ambulatory Visit | Attending: Emergency Medicine | Admitting: Emergency Medicine

## 2018-09-21 DIAGNOSIS — R911 Solitary pulmonary nodule: Secondary | ICD-10-CM | POA: Diagnosis not present

## 2018-09-21 DIAGNOSIS — R918 Other nonspecific abnormal finding of lung field: Secondary | ICD-10-CM | POA: Diagnosis not present

## 2018-09-21 MED ORDER — IOHEXOL 300 MG/ML  SOLN
80.0000 mL | Freq: Once | INTRAMUSCULAR | Status: AC | PRN
  Administered 2018-09-21: 80 mL via INTRAVENOUS

## 2018-09-29 DEATH — deceased

## 2018-10-01 DIAGNOSIS — H353132 Nonexudative age-related macular degeneration, bilateral, intermediate dry stage: Secondary | ICD-10-CM | POA: Diagnosis not present

## 2018-10-01 DIAGNOSIS — Z9889 Other specified postprocedural states: Secondary | ICD-10-CM | POA: Diagnosis not present

## 2018-10-01 DIAGNOSIS — H401133 Primary open-angle glaucoma, bilateral, severe stage: Secondary | ICD-10-CM | POA: Diagnosis not present

## 2018-10-02 ENCOUNTER — Encounter: Payer: Self-pay | Admitting: Emergency Medicine

## 2018-10-02 ENCOUNTER — Ambulatory Visit (INDEPENDENT_AMBULATORY_CARE_PROVIDER_SITE_OTHER): Payer: PPO | Admitting: Emergency Medicine

## 2018-10-02 ENCOUNTER — Other Ambulatory Visit: Payer: Self-pay

## 2018-10-02 VITALS — BP 132/86 | HR 84 | Ht 65.0 in | Wt 233.0 lb

## 2018-10-02 DIAGNOSIS — R911 Solitary pulmonary nodule: Secondary | ICD-10-CM | POA: Diagnosis not present

## 2018-10-02 DIAGNOSIS — R918 Other nonspecific abnormal finding of lung field: Secondary | ICD-10-CM

## 2018-10-02 NOTE — Progress Notes (Signed)
   Subjective:    Patient ID: Ashley Parker, female    DOB: 12/20/1931, 85 y.o.   MRN: [id number]  HPI  ROV 09/11/2018 --follow-up visit for 85 year old woman with history of tobacco use and mild COPD.  I been following her principally for pulmonary nodular disease.  A navigational bronchoscopy 6/26 was negative for malignancy.  She had some increased size of the solid component of her right upper lobe nodule on follow-up film 02/06/2018.  This prompted [place] to repeat the CT on 05/10/2018.  That scan showed no substantial change in the left upper lobe or right upper lobe nodules.  She did have some increased mediastinal lymphadenopathy.   ROV 10/02/2018 --85 year old woman with a history of former tobacco, mild COPD and pulmonary nodular disease.  She underwent navigational bronchoscopy 07/2017 that was negative.  There was some increase in size of left upper lobe, right upper lobe nodules in December 2019 but then stable March 2020 with some mediastinal lymphadenopathy.  She underwent repeat CT 09/21/2018 Which I have reviewed and shows no change in the left upper lobe nodule but a slight increase in size in the right upper lobe nodule to 2.2 x 1.8 cm.  She continues to have mediastinal lymphadenopathy, again slightly increased.   Review of Systems      Objective:   Physical Exam Vitals:   10/02/18 1354  BP: 132/86  Pulse: 84  SpO2: 97%  Weight: 233 lb (105.7 kg)  Height: 5' 5"  (1.651 m)   Gen: Pleasant, obese, in no distress,  normal affect  ENT: No lesions,  mouth clear,  oropharynx clear, no postnasal drip  Neck: No JVD, no stridor  Lungs: No use of accessory muscles, no wheeze or crackles  Cardiovascular: RRR, heart sounds normal, no murmur or gallops, no peripheral edema  Musculoskeletal: No deformities, no cyanosis or clubbing  Neuro: alert, non focal  Skin: Warm, no lesions or rash      Assessment & Plan:  Pulmonary nodules She has some interval increase in size in her  right upper lobe nodule consistent with a probable slow-growing lung cancer.  The mixed groundglass solid left upper lobe nodule is stable in appearance and size but is also still suspicious.  She now has mediastinal lymphadenopathy as well.  I explained all this to her, explained my concern that she may have primary lung cancer despite our negative biopsy results last June.  I have offered repeat bronchoscopy as a believe this is the best strategy to get a tissue diagnosis, including navigation and EBUS.  She considered this, would prefer to be conservative and recheck a CT in 3 months, use this information to help [place] decide next steps including possible biopsy.  We reviewed your CT scan of the chest today.  There is been some slight increase in size in your right upper lobe pulmonary nodule suggestive of possible slow-growing lung cancer.  We talked about the options for biopsy or following this area.  We decided to repeat your CT scan of the chest in October to watch for interval change. Follow with Dr Lamonte Sakai in October after your CT scan to review the results together  Baltazar Apo, MD, PhD 10/02/2018, 2:19 PM [facility] [phone] or if no answer [phone]

## 2018-10-02 NOTE — Patient Instructions (Signed)
We reviewed your CT scan of the chest today.  There is been some slight increase in size in your right upper lobe pulmonary nodule suggestive of possible slow-growing lung cancer.  We talked about the options for biopsy or following this area.  We decided to repeat your CT scan of the chest in October to watch for interval change. Follow with Dr Lamonte Sakai in October after your CT scan to review the results together

## 2018-10-02 NOTE — Assessment & Plan Note (Signed)
She has some interval increase in size in her right upper lobe nodule consistent with a probable slow-growing lung cancer.  The mixed groundglass solid left upper lobe nodule is stable in appearance and size but is also still suspicious.  She now has mediastinal lymphadenopathy as well.  I explained all this to her, explained my concern that she may have primary lung cancer despite our negative biopsy results last June.  I have offered repeat bronchoscopy as a believe this is the best strategy to get a tissue diagnosis, including navigation and EBUS.  She considered this, would prefer to be conservative and recheck a CT in 3 months, use this information to help [place] decide next steps including possible biopsy.  We reviewed your CT scan of the chest today.  There is been some slight increase in size in your right upper lobe pulmonary nodule suggestive of possible slow-growing lung cancer.  We talked about the options for biopsy or following this area.  We decided to repeat your CT scan of the chest in October to watch for interval change. Follow with Dr Lamonte Sakai in October after your CT scan to review the results together

## 2018-10-24 DIAGNOSIS — Z23 Encounter for immunization: Secondary | ICD-10-CM | POA: Diagnosis not present

## 2018-10-24 DIAGNOSIS — E1169 Type 2 diabetes mellitus with other specified complication: Secondary | ICD-10-CM | POA: Diagnosis not present

## 2018-10-24 DIAGNOSIS — E7849 Other hyperlipidemia: Secondary | ICD-10-CM | POA: Diagnosis not present

## 2018-10-24 DIAGNOSIS — M810 Age-related osteoporosis without current pathological fracture: Secondary | ICD-10-CM | POA: Diagnosis not present

## 2018-10-29 DIAGNOSIS — E1169 Type 2 diabetes mellitus with other specified complication: Secondary | ICD-10-CM | POA: Diagnosis not present

## 2018-10-29 DIAGNOSIS — R82998 Other abnormal findings in urine: Secondary | ICD-10-CM | POA: Diagnosis not present

## 2018-10-31 DIAGNOSIS — I110 Hypertensive heart disease with heart failure: Secondary | ICD-10-CM | POA: Diagnosis not present

## 2018-10-31 DIAGNOSIS — J45991 Cough variant asthma: Secondary | ICD-10-CM | POA: Diagnosis not present

## 2018-10-31 DIAGNOSIS — R748 Abnormal levels of other serum enzymes: Secondary | ICD-10-CM | POA: Diagnosis not present

## 2018-10-31 DIAGNOSIS — M810 Age-related osteoporosis without current pathological fracture: Secondary | ICD-10-CM | POA: Diagnosis not present

## 2018-10-31 DIAGNOSIS — F438 Other reactions to severe stress: Secondary | ICD-10-CM | POA: Diagnosis not present

## 2018-10-31 DIAGNOSIS — Z0000 Encounter for general adult medical examination without abnormal findings: Secondary | ICD-10-CM | POA: Diagnosis not present

## 2018-10-31 DIAGNOSIS — Z1331 Encounter for screening for depression: Secondary | ICD-10-CM | POA: Diagnosis not present

## 2018-10-31 DIAGNOSIS — E1169 Type 2 diabetes mellitus with other specified complication: Secondary | ICD-10-CM | POA: Diagnosis not present

## 2018-10-31 DIAGNOSIS — I5030 Unspecified diastolic (congestive) heart failure: Secondary | ICD-10-CM | POA: Diagnosis not present

## 2018-10-31 DIAGNOSIS — K5090 Crohn's disease, unspecified, without complications: Secondary | ICD-10-CM | POA: Diagnosis not present

## 2018-10-31 DIAGNOSIS — E785 Hyperlipidemia, unspecified: Secondary | ICD-10-CM | POA: Diagnosis not present

## 2018-10-31 DIAGNOSIS — R918 Other nonspecific abnormal finding of lung field: Secondary | ICD-10-CM | POA: Diagnosis not present

## 2018-11-14 DIAGNOSIS — Z1212 Encounter for screening for malignant neoplasm of rectum: Secondary | ICD-10-CM | POA: Diagnosis not present

## 2018-11-15 ENCOUNTER — Telehealth: Payer: Self-pay | Admitting: Emergency Medicine

## 2018-11-15 NOTE — Telephone Encounter (Signed)
I called the patient back about appt that is scheduled for  12/24/18 arrive at 12:45 at [facility] st then she stated that she thought it should be a bronch and her chest is tight

## 2018-11-15 NOTE — Telephone Encounter (Signed)
Spoke with the pt  She states that she is returning a call but deleted the msg and is unsure who called  I believe it may have been regarding her ct chest pending for Oct 2020  Will forward to [facility]  Please advise, thanks

## 2018-11-15 NOTE — Telephone Encounter (Signed)
Called spoke to patient. She states that Dr. Lamonte Sakai and her talked about possibly doing a bronch this time around instead of the CT. Patient states her chest is getting tight and increased shortness of breath when she walks up a hill or stairs. This is new for her.   She states the chest tightness is not a pain and it is not radiating to arms or jaw.   I informed patient RB won't be in office again until Tuesday 11/20/18 would this be okay for a response time, she stated that would be fine. I told her if things got worse before then to call us back or seek emergency help. Patient voiced understanding.   Will route to RB and Ria Comment to follow up on next week

## 2018-11-21 NOTE — Telephone Encounter (Signed)
Attempted to call patient, no answer, left message to call back.  

## 2018-11-21 NOTE — Telephone Encounter (Signed)
Yes I believe it would be reasonable to arrange for bronchoscopy. We could probably do so utilizing the July CT scan. Please set her up with an OV with me or APP to discuss her sx, talk about a bronchoscopy

## 2018-11-22 NOTE — Telephone Encounter (Signed)
Called spoke with patient, she wanted to discuss this with Dr. Lamonte Sakai, first available was 10/5/ @1545  appt made for patient.  Nothing further needed at this time.

## 2018-12-03 ENCOUNTER — Ambulatory Visit: Payer: PPO | Admitting: Emergency Medicine

## 2018-12-04 ENCOUNTER — Other Ambulatory Visit: Payer: Self-pay | Admitting: *Deleted

## 2018-12-04 ENCOUNTER — Ambulatory Visit (INDEPENDENT_AMBULATORY_CARE_PROVIDER_SITE_OTHER): Payer: PPO | Admitting: Emergency Medicine

## 2018-12-04 ENCOUNTER — Other Ambulatory Visit: Payer: Self-pay

## 2018-12-04 ENCOUNTER — Encounter: Payer: Self-pay | Admitting: Emergency Medicine

## 2018-12-04 DIAGNOSIS — I10 Essential (primary) hypertension: Secondary | ICD-10-CM

## 2018-12-04 DIAGNOSIS — R918 Other nonspecific abnormal finding of lung field: Secondary | ICD-10-CM | POA: Diagnosis not present

## 2018-12-04 DIAGNOSIS — R0789 Other chest pain: Secondary | ICD-10-CM

## 2018-12-04 NOTE — Assessment & Plan Note (Signed)
Based on the interval increase in size in her pulmonary nodules and her persistent, slightly enlarged mediastinal lymphadenopathy I think she does need a repeat bronchoscopy.  She is now more interested in pursuing this plan.  I will repeat her super D CT chest to get up to date nodule size and position, assess the nodes.  She has been having some exertional chest pressure that may relate to her mild COPD.  All the same she does have hypertension and I think is reasonable for her to have some cardiac risk stratification in preparation for possible general anesthesia and bronchoscopy.  Her husband follows with Dr. Percival Spanish at [facility] cardiology and she would like to see him to ensure no pretest work-up needs to be performed before we would proceed with bronchoscopy.

## 2018-12-04 NOTE — Progress Notes (Signed)
Virtual Visit via Telephone Note  I connected with Jerry Caras on 12/04/18 at 10:45 AM EDT by telephone and verified that I am speaking with the correct person using two identifiers.  Location: Patient: Home Provider: Home office   I discussed the limitations, risks, security and privacy concerns of performing an evaluation and management service by telephone and the availability of in person appointments. I also discussed with the patient that there may be a patient responsible charge related to this service. The patient expressed understanding and agreed to proceed.   History of Present Illness: 85 year old former smoker with mild COPD, HTN.  We have been following her for pulmonary nodular disease.  Navigational bronchoscopy 07/2017 was negative for malignancy but we been following interval films and there is been an increase in size in her right upper lobe nodule, persistent mediastinal lymphadenopathy.   Observations/Objective: We have discussed possible repeat navigational bronchoscopy or endobronchial ultrasound in office and had decided to be conservative, plan to repeat imaging.  This visit is to further discuss this plan.  She is having some chest pressure with exertion - walking up a hill, sometimes when taking a deep breath. Has noticed some dysphagia - has required esophageal stretching before. No change in cough, no hemoptysis. No wt loss or other constitutional sx.   Based on the interval changes in her CT chest, she is now more interested in pursuing a repeat bronchoscopy.    Assessment and Plan: Pulmonary nodules Based on the interval increase in size in her pulmonary nodules and her persistent, slightly enlarged mediastinal lymphadenopathy I think she does need a repeat bronchoscopy.  She is now more interested in pursuing this plan.  I will repeat her super D CT chest to get up to date nodule size and position, assess the nodes.  She has been having some exertional  chest pressure that may relate to her mild COPD.  All the same she does have hypertension and I think is reasonable for her to have some cardiac risk stratification in preparation for possible general anesthesia and bronchoscopy.  Her husband follows with Dr. Percival Spanish at [facility] cardiology and she would like to see him to ensure no pretest work-up needs to be performed before we would proceed with bronchoscopy.    Follow Up Instructions: 1 month    I discussed the assessment and treatment plan with the patient. The patient was provided an opportunity to ask questions and all were answered. The patient agreed with the plan and demonstrated an understanding of the instructions.   The patient was advised to call back or seek an in-person evaluation if the symptoms worsen or if the condition fails to improve as anticipated.  I provided 22 minutes of non-face-to-face time during this encounter.   Collene Gobble, MD

## 2018-12-05 ENCOUNTER — Ambulatory Visit (INDEPENDENT_AMBULATORY_CARE_PROVIDER_SITE_OTHER)
Admission: RE | Admit: 2018-12-05 | Discharge: 2018-12-05 | Disposition: A | Discharge status: Home / Self Care | Payer: PPO | Source: Ambulatory Visit | Attending: Emergency Medicine | Admitting: Emergency Medicine

## 2018-12-05 ENCOUNTER — Other Ambulatory Visit: Payer: Self-pay

## 2018-12-05 DIAGNOSIS — R911 Solitary pulmonary nodule: Secondary | ICD-10-CM

## 2018-12-19 ENCOUNTER — Encounter: Payer: Self-pay | Admitting: Cardiology

## 2018-12-19 NOTE — Progress Notes (Signed)
Cardiology Office Note   Date:  12/20/2018   ID:  Ashley Parker, DOB 11-19-1931, MRN [id number]  PCP:  Prince Solian, MD  Cardiologist:   No primary care provider on file. Referring:  Collene Gobble, MD  Chief Complaint  Patient presents with  . Chest Pain      History of Present Illness: Ashley Parker is a 85 y.o. female who is referred by Dr. Lamonte Sakai for evaluation of SOB and chest pain.  She is being followed by Dr. Lamonte Sakai for a pulmonary nodule. She is considering a repeat bronchoscopy.  When he was evaluating her last she mentioned that she was getting this discomfort.  It is about 3 months.  She has a 25 yard incline that she has to walk up to her car.  At the top of this she will have some discomfort radiating up the upper sternum and slightly across the chest.  It is moderate.  It is not into her jaw.  She does not have discomfort into her arms.  It goes away when she stops what she is doing after few minutes.  Is been a relatively stable pattern over those 3 months.  She may have to take a deep breath or cough.  She alternately will get some bilateral arm discomfort that is sporadic and not associated with this.  She does not describe diaphoresis.  She does not have any nausea or vomiting.  She has never had any prior cardiac work-up.  She does not have PND or orthopnea.  She has no weight gain or edema.   Past Medical History:  Diagnosis Date  . Arthritis   . Basal cell carcinoma (BCC) of skin of face 2016   removed in 2016  . COPD (chronic obstructive pulmonary disease) (HCC)    Mild  . Crohn's ileitis ([place])    Mild, mostly asymptomatic, not on therapy  . Fundic gland polyposis of stomach   . GERD (gastroesophageal reflux disease)   . Glaucoma   . Hemorrhoids    Internal and External  . History of hiatal hernia   . Hypertension   . Macular degeneration   . Obesity, unspecified   . Osteoporosis   . Pelvic fracture ([place]) 2007  . Stricture of esophagus     distal  . Unspecified vitamin D deficiency     Past Surgical History:  Procedure Laterality Date  . CATARACT EXTRACTION    . COLONOSCOPY  07/27/2001, 07/08/2011   Multiple  . ESOPHAGEAL DILATION  2013  . ESOPHAGOGASTRODUODENOSCOPY     multiple  . LAPAROSCOPIC APPENDECTOMY N/A 02/19/2014   Procedure: APPENDECTOMY LAPAROSCOPIC;  Surgeon: Erroll Luna, MD;  Location: [facility];  Service: General;  Laterality: N/A;  . MINOR PLACEMENT OF FIDUCIAL  08/23/2017   Procedure: MINOR PLACEMENT OF FIDUCIAL - Left Upper Lobe Lung x2, Right Upper Lobe Lung x3;  Surgeon: Collene Gobble, MD;  Location: [facility];  Service: Thoracic;;  . TONSILLECTOMY AND ADENOIDECTOMY     and adenoids   . TOTAL KNEE ARTHROPLASTY Left 2007  . VIDEO BRONCHOSCOPY WITH ENDOBRONCHIAL NAVIGATION N/A 08/23/2017   Procedure: VIDEO BRONCHOSCOPY WITH ENDOBRONCHIAL NAVIGATION;  Surgeon: Collene Gobble, MD;  Location: [facility];  Service: Thoracic;  Laterality: N/A;  . WRIST FRACTURE SURGERY Left 2018   with screws     Current Outpatient Medications  Medication Sig Dispense Refill  . Ascorbic Acid (VITAMIN C) 500 MG CHEW Chew 500 mg by mouth daily.    [name]  B Complex-C (SUPER B COMPLEX PO) Take 1 tablet by mouth daily.    . Calcium Carb-Cholecalciferol (CALCIUM 600+D3 PO) Take 1-2 tablets by mouth See admin instructions. Take 1 tablet in the morning & 2 tablet at night    . calcium elemental as carbonate (BARIATRIC TUMS ULTRA) 400 MG chewable tablet Chew 2,000 mg by mouth at bedtime.    . Cholecalciferol (VITAMIN D3) 2000 units TABS Take 2,000 Units by mouth daily.    . dorzolamide (TRUSOPT) 2 % ophthalmic solution Place 1 drop into both eyes 2 (two) times daily.    [name] EPINEPHrine 0.3 mg/0.3 mL IJ SOAJ injection Inject 0.3 mg into the muscle as needed (for allergic reaction).    . Flaxseed, Linseed, (FLAXSEED OIL PO) Take 1 tablet by mouth daily.    . fluticasone (FLONASE) 50 MCG/ACT nasal spray Place 1 spray into both nostrils daily as  needed for allergies.     [name] irbesartan (AVAPRO) 300 MG tablet Take 300 mg by mouth daily.     [name] LUMIGAN 0.01 % SOLN Place 1 drop into both eyes at bedtime.  4  . Magnesium 400 MG TABS Take 400 mg by mouth daily.    . Omega-3 Fatty Acids (FISH OIL) 1200 MG CAPS Take 1,200 mg by mouth daily.    Vladimir Faster Glycol-Propyl Glycol (SYSTANE) 0.4-0.3 % SOLN Place 1 drop into both eyes 3 (three) times daily as needed (for dry eyes.).    [name] timolol (BETIMOL) 0.5 % ophthalmic solution Place 1 drop into both eyes 2 (two) times daily.    . Turmeric 450 MG CAPS Take 450 mg by mouth daily.     . vitamin B-12 (CYANOCOBALAMIN) 1000 MCG tablet Take 1,000 mcg by mouth daily.    . vitamin E 400 UNIT capsule Take 400 Units by mouth daily. (180 mg)     No current facility-administered medications for this visit.     Allergies:   Wasp venom, Biaxin [clarithromycin], and Brimonidine    Social History:  The patient  reports that she quit smoking about 37 years ago. Her smoking use included cigarettes. She has a 32.00 pack-year smoking history. She has never used smokeless tobacco. She reports current alcohol use. She reports that she does not use drugs.   Family History:  The patient's family history includes Breast cancer in her mother; Heart disease in her mother; Prostate cancer in her father; Uterine cancer in her mother.    ROS:  Please see the history of present illness.   Otherwise, review of systems are positive for none.   All other systems are reviewed and negative.    PHYSICAL EXAM: VS:  BP (!) 181/83   Pulse 89   Temp (!) 96.8 F (36 C)   Ht 5' 5"  (1.651 m)   Wt 231 lb (104.8 kg)   SpO2 97%   BMI 38.44 kg/m  , BMI Body mass index is 38.44 kg/m. GENERAL:  Well appearing HEENT:  Pupils equal round and reactive, fundi not visualized, oral mucosa unremarkable NECK:  No jugular venous distention, waveform within normal limits, carotid upstroke brisk and symmetric, no bruits, no thyromegaly  LYMPHATICS:  No cervical, inguinal adenopathy LUNGS:  Clear to auscultation bilaterally BACK:  No CVA tenderness CHEST:  Unremarkable HEART:  PMI not displaced or sustained,S1 and S2 within normal limits, no S3, no S4, no clicks, no rubs, no murmurs ABD:  Flat, positive bowel sounds normal in frequency in pitch, no bruits, no rebound, no guarding,  no midline pulsatile mass, no hepatomegaly, no splenomegaly EXT:  2 plus pulses throughout, no edema, no cyanosis no clubbing SKIN:  No rashes no nodules NEURO:  Cranial nerves II through XII grossly intact, motor grossly intact throughout PSYCH:  Cognitively intact, oriented to person place and time    EKG:  EKG is ordered today. The ekg ordered today demonstrates sinus rhythm, rate 82, left axis deviation, left anterior fascicular block, no acute ST-T wave changes.   Recent Labs: 09/11/2018: BUN 21; Creatinine, Ser 0.85; Potassium 4.1; Sodium 141    Lipid Panel    Component Value Date/Time   CHOL 221 (H) 06/18/2010 0837   TRIG 99.0 06/18/2010 0837   HDL 60.60 06/18/2010 0837   CHOLHDL 4 06/18/2010 0837   VLDL 19.8 06/18/2010 0837   LDLCALC 124 (H) 06/05/2008 0945   LDLDIRECT 139.7 06/18/2010 0837      Wt Readings from Last 3 Encounters:  12/20/18 231 lb (104.8 kg)  10/02/18 233 lb (105.7 kg)  09/11/18 234 lb (106.1 kg)      Other studies Reviewed: Additional studies/ records that were reviewed today include: CT and pulmonary notes. Review of the above records demonstrates:  Please see elsewhere in the note.     ASSESSMENT AND PLAN:  CHEST PAIN:   The chest pain has some features typical for new onset exertional angina and some atypical features.  She needs screening with a stress test but would not be able walk on a treadmill.  Therefore, she will have a [employer].  SOB:    I will check a BNP level.  I will see her ejection fraction on the nuclear study.  If this comes back normal probably no further cardiac  work-up would be indicated.   Current medicines are reviewed at length with the patient today.  The patient does not have concerns regarding medicines.  The following changes have been made:  no change  Labs/ tests ordered today include:   Orders Placed This Encounter  Procedures  . Brain natriuretic peptide  . MYOCARDIAL PERFUSION IMAGING  . EKG 12-Lead     Disposition:   FU with me as needed based on the results of the above.   Signed, Minus Breeding, MD  12/20/2018 11:56 AM    [place]

## 2018-12-20 ENCOUNTER — Ambulatory Visit: Payer: PPO | Admitting: Cardiology

## 2018-12-20 ENCOUNTER — Other Ambulatory Visit: Payer: Self-pay

## 2018-12-20 ENCOUNTER — Encounter: Payer: Self-pay | Admitting: Cardiology

## 2018-12-20 VITALS — BP 181/83 | HR 89 | Temp 96.8°F | Ht 65.0 in | Wt 231.0 lb

## 2018-12-20 DIAGNOSIS — I409 Acute myocarditis, unspecified: Secondary | ICD-10-CM

## 2018-12-20 DIAGNOSIS — R911 Solitary pulmonary nodule: Secondary | ICD-10-CM | POA: Diagnosis not present

## 2018-12-20 DIAGNOSIS — R072 Precordial pain: Secondary | ICD-10-CM

## 2018-12-20 DIAGNOSIS — I10 Essential (primary) hypertension: Secondary | ICD-10-CM | POA: Diagnosis not present

## 2018-12-20 NOTE — Patient Instructions (Signed)
Medication Instructions:  Your physician recommends that you continue on your current medications as directed. Please refer to the Current Medication list given to you today.  If you need a refill on your cardiac medications before your next appointment, please call your pharmacy.   Lab work: BNP  Testing/Procedures: Your physician has requested that you have a lexiscan myoview. For further information please visit [email or web address]. Please follow instruction sheet, as given.   Follow-Up: Make appointments as needed.

## 2018-12-24 ENCOUNTER — Other Ambulatory Visit: Payer: PPO

## 2018-12-31 DIAGNOSIS — H401133 Primary open-angle glaucoma, bilateral, severe stage: Secondary | ICD-10-CM | POA: Diagnosis not present

## 2018-12-31 DIAGNOSIS — H353132 Nonexudative age-related macular degeneration, bilateral, intermediate dry stage: Secondary | ICD-10-CM | POA: Diagnosis not present

## 2018-12-31 DIAGNOSIS — Z9889 Other specified postprocedural states: Secondary | ICD-10-CM | POA: Diagnosis not present

## 2019-01-04 ENCOUNTER — Telehealth (HOSPITAL_COMMUNITY): Payer: Self-pay

## 2019-01-04 NOTE — Telephone Encounter (Signed)
Encounter complete. 

## 2019-01-07 ENCOUNTER — Telehealth: Payer: Self-pay | Admitting: Emergency Medicine

## 2019-01-07 DIAGNOSIS — R918 Other nonspecific abnormal finding of lung field: Secondary | ICD-10-CM

## 2019-01-07 NOTE — Telephone Encounter (Signed)
Spoke with the pt  She is requesting her CT Chest results from 12/05/18

## 2019-01-08 NOTE — Telephone Encounter (Signed)
Attempted to call patient at home and cell. Unable to reach her - will try again.  Her CT chest shows that her nodule has increased in size, now mass-like appearance with possible adjacent collapse. Our plan is bronchoscopy after we know her cardiac risk. She has a cardiac stress test scheduled for 11/11 per the chart. Await that testing and then will schedule her as soon as possible.

## 2019-01-09 ENCOUNTER — Ambulatory Visit (HOSPITAL_COMMUNITY)
Admission: RE | Admit: 2019-01-09 | Discharge: 2019-01-09 | Disposition: A | Discharge status: Home / Self Care | Payer: PPO | Source: Ambulatory Visit | Attending: Cardiovascular Disease | Admitting: Cardiovascular Disease

## 2019-01-09 ENCOUNTER — Other Ambulatory Visit: Payer: Self-pay

## 2019-01-09 DIAGNOSIS — R072 Precordial pain: Secondary | ICD-10-CM | POA: Diagnosis not present

## 2019-01-09 DIAGNOSIS — I409 Acute myocarditis, unspecified: Secondary | ICD-10-CM | POA: Diagnosis not present

## 2019-01-09 MED ORDER — REGADENOSON 0.4 MG/5ML IV SOLN
0.4000 mg | Freq: Once | INTRAVENOUS | Status: AC
  Administered 2019-01-09: 0.4 mg via INTRAVENOUS

## 2019-01-09 MED ORDER — TECHNETIUM TC 99M TETROFOSMIN IV KIT
30.5000 | PACK | Freq: Once | INTRAVENOUS | Status: AC | PRN
Start: 2019-01-09 — End: 2019-01-09
  Administered 2019-01-09: 30.5 via INTRAVENOUS
  Filled 2019-01-09: qty 31

## 2019-01-10 ENCOUNTER — Ambulatory Visit (HOSPITAL_COMMUNITY)
Admission: RE | Admit: 2019-01-10 | Discharge: 2019-01-10 | Disposition: A | Discharge status: Home / Self Care | Payer: PPO | Source: Ambulatory Visit | Attending: Cardiovascular Disease | Admitting: Cardiovascular Disease

## 2019-01-10 LAB — MYOCARDIAL PERFUSION IMAGING
LV dias vol: 68 mL (ref 46–106)
LV sys vol: 22 mL
Peak HR: 98 {beats}/min
Rest HR: 84 {beats}/min
SDS: 0
SRS: 0
SSS: 0
TID: 0.89

## 2019-01-10 MED ORDER — TECHNETIUM TC 99M TETROFOSMIN IV KIT
30.8000 | PACK | Freq: Once | INTRAVENOUS | Status: AC | PRN
  Administered 2019-01-10: 30.8 via INTRAVENOUS

## 2019-01-11 NOTE — Telephone Encounter (Signed)
lmtcb

## 2019-01-11 NOTE — Telephone Encounter (Signed)
Called patient, left message on her home phone, no VM on her cell.  Her cardiac stress test from 11/11 was reassuring >> low risk for ischemia.   I need to set her up for ENB, dx is RUL mass. I could do this on December 2, 3, or 4. I will send order to [facility] to get the scheduling underway.   Please try to call her to let her know the result, and that I want to go forward with ENB. She knows that this was going to be the plan as long as her heart testing was [facility]. Thanks

## 2019-01-14 ENCOUNTER — Telehealth: Payer: Self-pay | Admitting: Cardiology

## 2019-01-14 NOTE — Telephone Encounter (Signed)
Patient returning call - pt can be reached [phone]- or [phone] (cell phone) -pr

## 2019-01-14 NOTE — Telephone Encounter (Signed)
Patient returning call.

## 2019-01-14 NOTE — Telephone Encounter (Signed)
Advised patient

## 2019-01-14 NOTE — Telephone Encounter (Signed)
Spoke with patient. Let her know results and make her aware Dr. Lamonte Sakai wanted to do a ENB. Order placed Patient waiting for [facility] to call and schedule.   Nothing further needed at this time

## 2019-01-14 NOTE — Telephone Encounter (Signed)
-----   Message from Minus Breeding, MD sent at 01/11/2019  5:35 PM EST ----- Negative.  No further testing. Call Ms. Stegman with the results and send results to Prince Solian, MD

## 2019-01-14 NOTE — Telephone Encounter (Signed)
° ° °  Pease call with stress test results

## 2019-01-17 ENCOUNTER — Telehealth: Payer: Self-pay | Admitting: Emergency Medicine

## 2019-01-17 DIAGNOSIS — H3530 Unspecified macular degeneration: Secondary | ICD-10-CM | POA: Diagnosis not present

## 2019-01-17 DIAGNOSIS — I10 Essential (primary) hypertension: Secondary | ICD-10-CM | POA: Diagnosis not present

## 2019-01-17 DIAGNOSIS — H409 Unspecified glaucoma: Secondary | ICD-10-CM | POA: Diagnosis not present

## 2019-01-17 NOTE — Telephone Encounter (Signed)
Spoke with pt, she states she would like to switch from Dr. Lamonte Sakai to another pulmonologist, RA, VS, or Dr. Vaughan Browner. She states she hasn't heard from anyone about having an ENB as discussed and she knew nothing about a large mass that was found. She would like to know what she is dealing with and what the next steps are. I dee in the notes the order was placed but I dont see any notes regarding follow up. RB please advise and suggest who we should switch pt to from our group.   Dr Vaughan Browner would you take pt as a pt? I see that Dr. Lamonte Sakai is out until 01/23/2019 and pt wanted an appt as soon as possible.

## 2019-01-17 NOTE — Telephone Encounter (Addendum)
I spoke with pt and she would like to see if another pulmonologist can do her ENB other than Dr. Lamonte Sakai. Dr. Valeta Harms may be the only other pulmonologist that performs those procedures. Dr. Valeta Harms please advise if this can be arranged.

## 2019-01-17 NOTE — Telephone Encounter (Addendum)
Please let her know that I don't do the ENB or the kind of procedure she needs. Looks like Dr Lamonte Sakai and schedulers are already working on setting it up for early december and any change now with delay the scheduling further.  Schedule  visit with one of our APPs to go over the CT and plan in detail. Check with [facility] to expedite the process and call the patient with the date.

## 2019-01-18 NOTE — Telephone Encounter (Signed)
lmtcb for pt.  

## 2019-01-18 NOTE — Telephone Encounter (Signed)
Pt returned missed call and would like a call back [phone]

## 2019-01-18 NOTE — Telephone Encounter (Signed)
Spoke with pt, states she would like to have an OV with BI before scheduling the ENB.  Pt scheduled to see BI on 11/25.  Nothing further needed at this time- will close encounter.

## 2019-01-18 NOTE — Telephone Encounter (Signed)
PCCM:  I am happy to see patient for bronchoscopy. Please schedule in my clinic for next Wednesday. I will see her and discuss r/b/a. I think it would be best for me to meet with her before scheduling.   However if she is ready for scheduling I would be available on Dec. 4 for the procedure and we should go ahead and working reserving the room in [place].   Thanks  Garner Nash, DO [facility] 01/18/2019 12:37 PM

## 2019-01-23 ENCOUNTER — Ambulatory Visit (INDEPENDENT_AMBULATORY_CARE_PROVIDER_SITE_OTHER): Payer: PPO | Admitting: Pulmonary Disease

## 2019-01-23 ENCOUNTER — Telehealth: Payer: Self-pay

## 2019-01-23 ENCOUNTER — Other Ambulatory Visit: Payer: Self-pay

## 2019-01-23 ENCOUNTER — Encounter: Payer: Self-pay | Admitting: Pulmonary Disease

## 2019-01-23 DIAGNOSIS — R918 Other nonspecific abnormal finding of lung field: Secondary | ICD-10-CM | POA: Diagnosis not present

## 2019-01-23 DIAGNOSIS — Z01818 Encounter for other preprocedural examination: Secondary | ICD-10-CM | POA: Diagnosis not present

## 2019-01-23 DIAGNOSIS — R590 Localized enlarged lymph nodes: Secondary | ICD-10-CM

## 2019-01-23 NOTE — Progress Notes (Signed)
Virtual Visit via Telephone Note  I connected with Ashley Parker on 01/23/19 at  9:15 AM EST by telephone and verified that I am speaking with the correct person using two identifiers.  Location: Patient: Ashley Parker  Provider: Garner Nash, DO    I discussed the limitations, risks, security and privacy concerns of performing an evaluation and management service by telephone and the availability of in person appointments. I also discussed with the patient that there may be a patient responsible charge related to this service. The patient expressed understanding and agreed to proceed.   History of Present Illness:  This is an 85 year old female, patient of Dr. Lamonte Sakai.  Seen for second opinion regarding abnormal CT imaging concerning for an advanced stage bronchogenic carcinoma.  She has been followed sometime by Dr. Chase Caller initially then Dr. Lamonte Sakai for her lung nodules.  Today on the phone we discussed her history in detail.  Starting back from 2019 in which she first saw Dr. Chase Caller for asthma/COPD type symptoms.  Her initial CT scan in 2019 revealed an upper lobe nodule.  Ultimately had a nuclear medicine pet imaging 2019 that had low-level uptake.  Patient was taken in June 2019 for navigational bronchoscopy by Dr. Lamonte Sakai and the tissue samples were negative for malignancy.  Subsequent had follow-up imaging in December 2019 which revealed growth of the right upper lobe nodule.  At this time was also recommended to have repeat bronchoscopy and the patient denied.  Again subsequent follow-up images in March as well as July and now into October.  Persistent nodularity on both right upper lobe and left upper lobe and increasing size of mediastinal adenopathy.  Most recent CT imaging was in October 2020.  This now reveals a new 9.6 x 6.6 x 5.4 cm masslike density.  As well as significant mediastinal adenopathy concerning for an advanced stage bronchogenic carcinoma.  In addition there was  progressive mediastinal adenopathy and evidence of interlobular septal thickening concerning of lymphangitic spread of disease. The patient's images have been independently reviewed by me today.  Today on the phone we discussed   Observations/Objective: Patient seems nervous/anxious. No labored breathing  Assessment and Plan:  Abnormal CT chest Lung mass, right upper lobe, lung mass left upper lobe Mediastinal adenopathy -All imaging concerning for an advanced stage bronchogenic carcinoma. -Plan for video bronchoscopy with endobronchial ultrasound transbronchial needle aspirations -Today on the phone we discussed the risks, benefits and alternatives of proceeding with procedure and diagnosis. -Patient is agreeable to proceed. -We will planned for procedure on December 3 -We will also have to arrange preoperative Covid testing -We will need to obtain super D CT imaging  Follow Up Instructions:  Bronchoscopy to be scheduled January 31, 2019. Preoperative details to follow. Will need preop labs We will also need to obtain super D CT imaging   I discussed the assessment and treatment plan with the patient. The patient was provided an opportunity to ask questions and all were answered. The patient agreed with the plan and demonstrated an understanding of the instructions.   The patient was advised to call back or seek an in-person evaluation if the symptoms worsen or if the condition fails to improve as anticipated.   I provided 24 minutes of non-face-to-face time during this encounter.   Garner Nash, DO

## 2019-01-23 NOTE — Patient Instructions (Signed)
Thank you for visiting Dr. Valeta Harms at [facility]. Today we recommend the following:  Orders Placed This Encounter  Procedures  . Ambulatory referral to Pulmonology   Preop Covid testing needs to be arranged. Preop labs, CMP, CBC, PT, INR  Return in about 3 weeks (around 02/13/2019) for with APP or Dr. Valeta Harms.    Please do your part to reduce the spread of COVID-19.

## 2019-01-23 NOTE — Telephone Encounter (Signed)
Call made to patient, confirmed DOB.   Dates given:  Bronch 12/3 at 2pm. Arrive by 1245am at [facility]. Aware to go to central entrance. NPO after midnight. No dark liquids. Take meds with a sip of water.   Covid Test 11/30 at 140pm at [address]   Voiced understanding. Patient able to repeat back instructions.   Nothing further needed at this time.

## 2019-01-23 NOTE — Addendum Note (Signed)
Addended by: June Leap on: 01/23/2019 11:53 AM   Modules accepted: Orders

## 2019-01-28 ENCOUNTER — Other Ambulatory Visit (HOSPITAL_COMMUNITY): Payer: PPO

## 2019-01-29 NOTE — Pre-Procedure Instructions (Signed)
[facility] [address], [address] [place] [address] Phone: [phone] Fax: [phone]     Your procedure is scheduled on Thursday, December 3rd, from 1:59 PM to 3:59 PM.  Report to [facility] Main Entrance "A" at 11:55 A.M., and check in at the Admitting office.  Call this number if you have problems the morning of surgery:  [phone]  Call [phone] if you have any questions prior to your surgery date Monday-Friday 8am-4pm    Remember:  Do not eat or drink after midnight the night before your surgery    Take these medicines the morning of surgery: dorzolamide (TRUSOPT) eye drops timolol (TIMOPTIC) eye drops  IF NEEDED: fluticasone (FLONASE) nasal spray Polyethyl Glycol-Propyl Glycol (SYSTANE) eye drops  As of today, STOP taking any Aspirin (unless otherwise instructed by your surgeon), NSAIDs such as diclofenac Sodium (VOLTAREN) GEL, Aleve, Naproxen, Ibuprofen, Motrin, Advil, Goody's, BC's, all herbal medications, fish oil, and all vitamins.    The Morning of Surgery  Do not wear jewelry, make-up or nail polish.  Do not wear lotions, powders, perfumes, or deodorant  Do not shave 48 hours prior to surgery.    Do not bring valuables to the hospital.  [facility] is not responsible for any belongings or valuables.  If you are a smoker, DO NOT Smoke 24 hours prior to surgery  If you wear a CPAP at night please bring your mask, tubing, and machine the morning of surgery   Remember that you must have someone to transport you home after your surgery, and remain with you for 24 hours if you are discharged the same day.   Please bring cases for contacts, glasses, hearing aids, dentures or bridgework because it cannot be worn into surgery.    Leave your suitcase in the car.  After surgery it may be brought to your room.  For patients admitted to the hospital, discharge time will be  determined by your treatment team.  Patients discharged the day of surgery will not be allowed to drive home.    Special instructions:   [facility]- Preparing For Surgery  Before surgery, you can play an important role. Because skin is not sterile, your skin needs to be as free of germs as possible. You can reduce the number of germs on your skin by washing with CHG (chlorahexidine gluconate) Soap before surgery.  CHG is an antiseptic cleaner which kills germs and bonds with the skin to continue killing germs even after washing.    Oral Hygiene is also important to reduce your risk of infection.  Remember - BRUSH YOUR TEETH THE MORNING OF SURGERY WITH YOUR REGULAR TOOTHPASTE  Please do not use if you have an allergy to CHG or antibacterial soaps. If your skin becomes reddened/irritated stop using the CHG.  Do not shave (including legs and underarms) for at least 48 hours prior to first CHG shower. It is OK to shave your face.  Please follow these instructions carefully.   1. Shower the NIGHT BEFORE SURGERY and the MORNING OF SURGERY with CHG Soap.   2. If you chose to wash your hair, wash your hair first as usual with your normal shampoo.  3. After you shampoo, rinse your hair and body thoroughly to remove the shampoo.  4. Use CHG as you would any other liquid soap. You can apply CHG directly to the skin and wash gently with  a scrungie or a clean washcloth.   5. Apply the CHG Soap to your body ONLY FROM THE NECK DOWN.  Do not use on open wounds or open sores. Avoid contact with your eyes, ears, mouth and genitals (private parts). Wash Face and genitals (private parts)  with your normal soap.   6. Wash thoroughly, paying special attention to the area where your surgery will be performed.  7. Thoroughly rinse your body with warm water from the neck down.  8. DO NOT shower/wash with your normal soap after using and rinsing off the CHG Soap.  9. Pat yourself dry with a CLEAN  TOWEL.  10. Wear CLEAN PAJAMAS to bed the night before surgery, wear comfortable clothes the morning of surgery  11. Place CLEAN SHEETS on your bed the night of your first shower and DO NOT SLEEP WITH PETS.    Day of Surgery:  Remember to brush your teeth WITH YOUR REGULAR TOOTHPASTE. Please shower the morning of surgery with the CHG soap Do not apply any deodorants/lotions. Please wear clean clothes to the hospital/surgery center.      Please read over the following fact sheets that you were given.

## 2019-01-29 NOTE — Progress Notes (Signed)
Call made to patient to determine if she had her covid test yet. Patient stated she was supposed to have it done on 01/28/2019, but forgot. Patient instructed to go to [address] for covid test on 01/30/2019 anytime before 12 noon.Patient confirmed she will get there for 10 am.

## 2019-01-30 ENCOUNTER — Other Ambulatory Visit (HOSPITAL_COMMUNITY)
Admission: RE | Admit: 2019-01-30 | Discharge: 2019-01-30 | Disposition: A | Discharge status: Home / Self Care | Payer: PPO | Source: Ambulatory Visit | Attending: Pulmonary Disease | Admitting: Pulmonary Disease

## 2019-01-30 ENCOUNTER — Encounter (HOSPITAL_COMMUNITY)
Admission: RE | Admit: 2019-01-30 | Discharge: 2019-01-30 | Disposition: A | Discharge status: Home / Self Care | Payer: PPO | Source: Ambulatory Visit | Attending: Pulmonary Disease | Admitting: Pulmonary Disease

## 2019-01-30 ENCOUNTER — Other Ambulatory Visit: Payer: Self-pay

## 2019-01-30 ENCOUNTER — Encounter (HOSPITAL_COMMUNITY): Payer: Self-pay

## 2019-01-30 DIAGNOSIS — Z20828 Contact with and (suspected) exposure to other viral communicable diseases: Secondary | ICD-10-CM | POA: Insufficient documentation

## 2019-01-30 DIAGNOSIS — Z01812 Encounter for preprocedural laboratory examination: Secondary | ICD-10-CM | POA: Diagnosis not present

## 2019-01-30 LAB — COMPREHENSIVE METABOLIC PANEL
ALT: 55 U/L — ABNORMAL HIGH (ref 0–44)
AST: 41 U/L (ref 15–41)
Albumin: 3.8 g/dL (ref 3.5–5.0)
Alkaline Phosphatase: 98 U/L (ref 38–126)
Anion gap: 13 (ref 5–15)
BUN: 18 mg/dL (ref 8–23)
CO2: 22 mmol/L (ref 22–32)
Calcium: 9.9 mg/dL (ref 8.9–10.3)
Chloride: 104 mmol/L (ref 98–111)
Creatinine, Ser: 0.82 mg/dL (ref 0.44–1.00)
GFR calc Af Amer: >60 mL/min (ref >60)
GFR calc non Af Amer: >60 mL/min (ref >60)
Glucose, Bld: 124 mg/dL — ABNORMAL HIGH (ref 70–99)
Potassium: 4.2 mmol/L (ref 3.5–5.1)
Sodium: 139 mmol/L (ref 135–145)
Total Bilirubin: 0.8 mg/dL (ref 0.3–1.2)
Total Protein: 7 g/dL (ref 6.5–8.1)

## 2019-01-30 LAB — CBC
HCT: 47.6 % — ABNORMAL HIGH (ref 36.0–46.0)
Hemoglobin: 15.2 g/dL — ABNORMAL HIGH (ref 12.0–15.0)
MCH: 29 pg (ref 26.0–34.0)
MCHC: 31.9 g/dL (ref 30.0–36.0)
MCV: 90.8 fL (ref 80.0–100.0)
Platelets: 316 10*3/uL (ref 150–400)
RBC: 5.24 MIL/uL — ABNORMAL HIGH (ref 3.87–5.11)
RDW: 13.3 % (ref 11.5–15.5)
WBC: 9.2 10*3/uL (ref 4.0–10.5)
nRBC: 0 % (ref 0.0–0.2)

## 2019-01-30 LAB — PROTIME-INR
INR: 1 (ref 0.8–1.2)
Prothrombin Time: 13.2 seconds (ref 11.4–15.2)

## 2019-01-30 LAB — APTT: aPTT: 28 seconds (ref 24–36)

## 2019-01-30 LAB — SARS CORONAVIRUS 2 (TAT 6-24 HRS): SARS Coronavirus 2: NEGATIVE

## 2019-01-30 NOTE — Progress Notes (Signed)
PCP - Dr. Dagmar Hait Cardiologist - Dr. Percival Spanish   Chest x-ray - N/A EKG - 12/20/18 Stress Test - 01/09/19 ECHO - 10/31/13 Cardiac Cath - denies  Sleep Study - denies  Aspirin Instructions: Patient instructed to hold all Aspirin, NSAID's, herbal medications, fish oil and vitamins 7 days prior to surgery.   COVID TEST- 01/30/19- result pending   Anesthesia review:   Patient denies shortness of breath, fever, cough and chest pain at PAT appointment   All instructions explained to the patient, with a verbal understanding of the material. Patient agrees to go over the instructions while at home for a better understanding. Patient also instructed to self quarantine after being tested for COVID-19. The opportunity to ask questions was provided.

## 2019-01-31 ENCOUNTER — Ambulatory Visit (HOSPITAL_COMMUNITY): Payer: PPO

## 2019-01-31 ENCOUNTER — Observation Stay (HOSPITAL_COMMUNITY): Payer: PPO

## 2019-01-31 ENCOUNTER — Encounter (HOSPITAL_COMMUNITY): Payer: Self-pay

## 2019-01-31 ENCOUNTER — Ambulatory Visit (HOSPITAL_COMMUNITY): Payer: PPO | Admitting: Anesthesiology

## 2019-01-31 ENCOUNTER — Observation Stay (HOSPITAL_COMMUNITY)
Admission: RE | Admit: 2019-01-31 | Discharge: 2019-02-01 | Disposition: A | Discharge status: Home / Self Care | Payer: PPO | Attending: Pulmonary Disease | Admitting: Pulmonary Disease

## 2019-01-31 ENCOUNTER — Encounter (HOSPITAL_COMMUNITY)
Admission: RE | Disposition: A | Discharge status: Home / Self Care | Payer: Self-pay | Source: Home / Self Care | Attending: Pulmonary Disease

## 2019-01-31 DIAGNOSIS — Z419 Encounter for procedure for purposes other than remedying health state, unspecified: Secondary | ICD-10-CM

## 2019-01-31 DIAGNOSIS — K449 Diaphragmatic hernia without obstruction or gangrene: Secondary | ICD-10-CM | POA: Insufficient documentation

## 2019-01-31 DIAGNOSIS — I10 Essential (primary) hypertension: Secondary | ICD-10-CM | POA: Diagnosis not present

## 2019-01-31 DIAGNOSIS — Z7951 Long term (current) use of inhaled steroids: Secondary | ICD-10-CM | POA: Insufficient documentation

## 2019-01-31 DIAGNOSIS — Z87891 Personal history of nicotine dependence: Secondary | ICD-10-CM | POA: Insufficient documentation

## 2019-01-31 DIAGNOSIS — Z9889 Other specified postprocedural states: Secondary | ICD-10-CM

## 2019-01-31 DIAGNOSIS — K219 Gastro-esophageal reflux disease without esophagitis: Secondary | ICD-10-CM | POA: Insufficient documentation

## 2019-01-31 DIAGNOSIS — Z6837 Body mass index (BMI) 37.0-37.9, adult: Secondary | ICD-10-CM | POA: Diagnosis not present

## 2019-01-31 DIAGNOSIS — M810 Age-related osteoporosis without current pathological fracture: Secondary | ICD-10-CM | POA: Insufficient documentation

## 2019-01-31 DIAGNOSIS — H409 Unspecified glaucoma: Secondary | ICD-10-CM | POA: Insufficient documentation

## 2019-01-31 DIAGNOSIS — R590 Localized enlarged lymph nodes: Secondary | ICD-10-CM | POA: Diagnosis not present

## 2019-01-31 DIAGNOSIS — C3412 Malignant neoplasm of upper lobe, left bronchus or lung: Secondary | ICD-10-CM | POA: Diagnosis not present

## 2019-01-31 DIAGNOSIS — Z20828 Contact with and (suspected) exposure to other viral communicable diseases: Secondary | ICD-10-CM | POA: Insufficient documentation

## 2019-01-31 DIAGNOSIS — D72829 Elevated white blood cell count, unspecified: Secondary | ICD-10-CM | POA: Insufficient documentation

## 2019-01-31 DIAGNOSIS — M1990 Unspecified osteoarthritis, unspecified site: Secondary | ICD-10-CM | POA: Insufficient documentation

## 2019-01-31 DIAGNOSIS — R849 Unspecified abnormal finding in specimens from respiratory organs and thorax: Secondary | ICD-10-CM | POA: Diagnosis not present

## 2019-01-31 DIAGNOSIS — J449 Chronic obstructive pulmonary disease, unspecified: Secondary | ICD-10-CM | POA: Diagnosis not present

## 2019-01-31 DIAGNOSIS — R918 Other nonspecific abnormal finding of lung field: Secondary | ICD-10-CM | POA: Diagnosis not present

## 2019-01-31 DIAGNOSIS — Z85828 Personal history of other malignant neoplasm of skin: Secondary | ICD-10-CM | POA: Insufficient documentation

## 2019-01-31 DIAGNOSIS — R911 Solitary pulmonary nodule: Secondary | ICD-10-CM | POA: Diagnosis present

## 2019-01-31 DIAGNOSIS — E559 Vitamin D deficiency, unspecified: Secondary | ICD-10-CM | POA: Insufficient documentation

## 2019-01-31 DIAGNOSIS — R7401 Elevation of levels of liver transaminase levels: Secondary | ICD-10-CM | POA: Insufficient documentation

## 2019-01-31 DIAGNOSIS — Z791 Long term (current) use of non-steroidal anti-inflammatories (NSAID): Secondary | ICD-10-CM | POA: Insufficient documentation

## 2019-01-31 DIAGNOSIS — E669 Obesity, unspecified: Secondary | ICD-10-CM | POA: Diagnosis not present

## 2019-01-31 DIAGNOSIS — J95811 Postprocedural pneumothorax: Secondary | ICD-10-CM | POA: Diagnosis present

## 2019-01-31 DIAGNOSIS — J939 Pneumothorax, unspecified: Secondary | ICD-10-CM | POA: Diagnosis not present

## 2019-01-31 DIAGNOSIS — J9811 Atelectasis: Secondary | ICD-10-CM | POA: Diagnosis not present

## 2019-01-31 DIAGNOSIS — Z79899 Other long term (current) drug therapy: Secondary | ICD-10-CM | POA: Insufficient documentation

## 2019-01-31 HISTORY — PX: VIDEO BRONCHOSCOPY WITH ENDOBRONCHIAL NAVIGATION: SHX6175

## 2019-01-31 HISTORY — PX: VIDEO BRONCHOSCOPY WITH ENDOBRONCHIAL ULTRASOUND: SHX6177

## 2019-01-31 LAB — GLUCOSE, CAPILLARY: Glucose-Capillary: 124 mg/dL — ABNORMAL HIGH (ref 70–99)

## 2019-01-31 LAB — MRSA PCR SCREENING: MRSA by PCR: NEGATIVE

## 2019-01-31 SURGERY — BRONCHOSCOPY, WITH EBUS
Anesthesia: General

## 2019-01-31 MED ORDER — ALBUTEROL SULFATE (2.5 MG/3ML) 0.083% IN NEBU
INHALATION_SOLUTION | RESPIRATORY_TRACT | Status: AC
  Administered 2019-01-31: 16:00:00 2.5 mg
  Filled 2019-01-31: qty 3

## 2019-01-31 MED ORDER — LIDOCAINE 2% (20 MG/ML) 5 ML SYRINGE
INTRAMUSCULAR | Status: DC | PRN
  Administered 2019-01-31: 60 mg via INTRAVENOUS

## 2019-01-31 MED ORDER — LIDOCAINE 2% (20 MG/ML) 5 ML SYRINGE
INTRAMUSCULAR | Status: AC
  Filled 2019-01-31: qty 5

## 2019-01-31 MED ORDER — LACTATED RINGERS IV SOLN
INTRAVENOUS | Status: DC
  Administered 2019-01-31 (×2): via INTRAVENOUS

## 2019-01-31 MED ORDER — SUGAMMADEX SODIUM 200 MG/2ML IV SOLN
INTRAVENOUS | Status: DC | PRN
  Administered 2019-01-31: 200 mg via INTRAVENOUS

## 2019-01-31 MED ORDER — DEXAMETHASONE SODIUM PHOSPHATE 10 MG/ML IJ SOLN
INTRAMUSCULAR | Status: AC
  Filled 2019-01-31: qty 1

## 2019-01-31 MED ORDER — 0.9 % SODIUM CHLORIDE (POUR BTL) OPTIME
TOPICAL | Status: DC | PRN
  Administered 2019-01-31: 1000 mL

## 2019-01-31 MED ORDER — ALBUTEROL SULFATE (2.5 MG/3ML) 0.083% IN NEBU: 2.5000 mg | INHALATION_SOLUTION | Freq: Once | RESPIRATORY_TRACT | Status: DC

## 2019-01-31 MED ORDER — PROPOFOL 10 MG/ML IV BOLUS
INTRAVENOUS | Status: AC
  Filled 2019-01-31: qty 40

## 2019-01-31 MED ORDER — DEXAMETHASONE SODIUM PHOSPHATE 4 MG/ML IJ SOLN
INTRAMUSCULAR | Status: DC | PRN
  Administered 2019-01-31: 4 mg via INTRAVENOUS

## 2019-01-31 MED ORDER — FENTANYL CITRATE (PF) 100 MCG/2ML IJ SOLN
INTRAMUSCULAR | Status: DC | PRN
  Administered 2019-01-31 (×3): 50 ug via INTRAVENOUS

## 2019-01-31 MED ORDER — PROPOFOL 10 MG/ML IV BOLUS
INTRAVENOUS | Status: DC | PRN
  Administered 2019-01-31: 120 mg via INTRAVENOUS

## 2019-01-31 MED ORDER — ROCURONIUM BROMIDE 10 MG/ML (PF) SYRINGE
PREFILLED_SYRINGE | INTRAVENOUS | Status: DC | PRN
  Administered 2019-01-31: 50 mg via INTRAVENOUS
  Administered 2019-01-31: 10 mg via INTRAVENOUS
  Administered 2019-01-31: 30 mg via INTRAVENOUS

## 2019-01-31 MED ORDER — ROCURONIUM BROMIDE 10 MG/ML (PF) SYRINGE
PREFILLED_SYRINGE | INTRAVENOUS | Status: AC
  Filled 2019-01-31: qty 10

## 2019-01-31 MED ORDER — FENTANYL CITRATE (PF) 250 MCG/5ML IJ SOLN
INTRAMUSCULAR | Status: AC
  Filled 2019-01-31: qty 5

## 2019-01-31 MED ORDER — ONDANSETRON HCL 4 MG/2ML IJ SOLN
INTRAMUSCULAR | Status: AC
  Filled 2019-01-31: qty 2

## 2019-01-31 MED ORDER — ONDANSETRON HCL 4 MG/2ML IJ SOLN
INTRAMUSCULAR | Status: DC | PRN
  Administered 2019-01-31: 4 mg via INTRAVENOUS

## 2019-01-31 MED ORDER — ACETAMINOPHEN 325 MG PO TABS: 650.0000 mg | ORAL_TABLET | ORAL | Status: DC | PRN

## 2019-01-31 MED ORDER — ONDANSETRON HCL 4 MG/2ML IJ SOLN: 4.0000 mg | Freq: Four times a day (QID) | INTRAMUSCULAR | Status: DC | PRN

## 2019-01-31 MED ORDER — PHENYLEPHRINE HCL-NACL 10-0.9 MG/250ML-% IV SOLN
INTRAVENOUS | Status: DC | PRN
  Administered 2019-01-31: 25 ug/min via INTRAVENOUS

## 2019-01-31 SURGICAL SUPPLY — 49 items
ADAPTER BRONCHOSCOPE OLYMPUS (ADAPTER) ×3 IMPLANT
ADAPTER VALVE BIOPSY EBUS (MISCELLANEOUS) IMPLANT
ADPTR VALVE BIOPSY EBUS (MISCELLANEOUS)
BRUSH CYTOL CELLEBRITY 1.5X140 (MISCELLANEOUS) ×3 IMPLANT
BRUSH SUPERTRAX BIOPSY (INSTRUMENTS) IMPLANT
BRUSH SUPERTRAX NDL-TIP CYTO (INSTRUMENTS) ×3 IMPLANT
CANISTER SUCT 3000ML PPV (MISCELLANEOUS) ×3 IMPLANT
CONT SPEC 4OZ CLIKSEAL STRL BL (MISCELLANEOUS) ×6 IMPLANT
COVER BACK TABLE 60X90IN (DRAPES) ×3 IMPLANT
COVER WAND RF STERILE (DRAPES) ×3 IMPLANT
FILTER STRAW FLUID ASPIR (MISCELLANEOUS) IMPLANT
FORCEPS BIOP RJ4 1.8 (CUTTING FORCEPS) ×3 IMPLANT
FORCEPS BIOP SUPERTRX PREMAR (INSTRUMENTS) ×3 IMPLANT
GAUZE SPONGE 4X4 12PLY STRL (GAUZE/BANDAGES/DRESSINGS) ×3 IMPLANT
GLOVE SURG SS PI 7.5 STRL IVOR (GLOVE) ×6 IMPLANT
GOWN STRL REUS W/ TWL LRG LVL3 (GOWN DISPOSABLE) ×2 IMPLANT
GOWN STRL REUS W/TWL LRG LVL3 (GOWN DISPOSABLE) ×4
KIT CLEAN ENDO COMPLIANCE (KITS) ×6 IMPLANT
KIT ILLUMISITE 180 PROCEDURE (KITS) IMPLANT
KIT ILLUMISITE 90 PROCEDURE (KITS) ×3 IMPLANT
KIT LOCATABLE GUIDE (CANNULA) IMPLANT
KIT MARKER FIDUCIAL DELIVERY (KITS) IMPLANT
KIT TURNOVER KIT B (KITS) ×3 IMPLANT
MARKER SKIN DUAL TIP RULER LAB (MISCELLANEOUS) ×3 IMPLANT
NEEDLE ASPIRATION VIZISHOT 19G (NEEDLE) ×6 IMPLANT
NEEDLE ASPIRATION VIZISHOT 21G (NEEDLE) IMPLANT
NEEDLE SUPERTRX PREMARK BIOPSY (NEEDLE) ×3 IMPLANT
NS IRRIG 1000ML POUR BTL (IV SOLUTION) ×3 IMPLANT
OIL SILICONE PENTAX (PARTS (SERVICE/REPAIRS)) ×3 IMPLANT
PAD ARMBOARD 7.5X6 YLW CONV (MISCELLANEOUS) ×6 IMPLANT
PATCHES PATIENT (LABEL) ×9 IMPLANT
SOL ANTI FOG 6CC (MISCELLANEOUS) ×1 IMPLANT
SOLUTION ANTI FOG 6CC (MISCELLANEOUS) ×2
STOPCOCK 4 WAY LG BORE MALE ST (IV SETS) ×3 IMPLANT
SYR 20ML ECCENTRIC (SYRINGE) ×9 IMPLANT
SYR 20ML LL LF (SYRINGE) ×3 IMPLANT
SYR 3ML LL SCALE MARK (SYRINGE) IMPLANT
SYR 50ML SLIP (SYRINGE) ×3 IMPLANT
SYR 5ML LL (SYRINGE) ×12 IMPLANT
TOWEL GREEN STERILE FF (TOWEL DISPOSABLE) ×3 IMPLANT
TRAP SPECIMEN MUCOUS 40CC (MISCELLANEOUS) ×6 IMPLANT
TUBE CONNECTING 20'X1/4 (TUBING) ×1
TUBE CONNECTING 20X1/4 (TUBING) ×2 IMPLANT
TUBING EXTENTION W/L.L. (IV SETS) ×3 IMPLANT
UNDERPAD 30X30 (UNDERPADS AND DIAPERS) ×3 IMPLANT
VALVE BIOPSY  SINGLE USE (MISCELLANEOUS) ×2
VALVE BIOPSY SINGLE USE (MISCELLANEOUS) ×1 IMPLANT
VALVE SUCTION BRONCHIO DISP (MISCELLANEOUS) ×3 IMPLANT
WATER STERILE IRR 1000ML POUR (IV SOLUTION) ×3 IMPLANT

## 2019-01-31 NOTE — Transfer of Care (Signed)
Immediate Anesthesia Transfer of Care Note  Patient: Ashley Parker  Procedure(s) Performed: VIDEO BRONCHOSCOPY WITH ENDOBRONCHIAL ULTRASOUND (N/A ) VIDEO BRONCHOSCOPY WITH ENDOBRONCHIAL NAVIGATION (N/A )  Patient Location: PACU  Anesthesia Type:General  Level of Consciousness: awake, alert , oriented and patient cooperative  Airway & Oxygen Therapy: Patient Spontanous Breathing and Patient connected to face mask oxygen  Post-op Assessment: Report given to RN and Post -op Vital signs reviewed and stable  Post vital signs: Reviewed and stable  Last Vitals:  Vitals Value Taken Time  BP    Temp    Pulse 93 01/31/19 1611  Resp    SpO2 90 % 01/31/19 1611  Vitals shown include unvalidated device data.  Last Pain:  Vitals:   01/31/19 1215  TempSrc:   PainSc: 0-No pain         Complications: No apparent anesthesia complications

## 2019-01-31 NOTE — H&P (Signed)
NAME:  Ashley Parker, MRN:  [id number], DOB:  [birth year], LOS: 0 ADMISSION DATE:  01/31/2019, CONSULTATION DATE:  12/3 REFERRING MD:  Valeta Harms, CHIEF COMPLAINT: Pneumothorax post biopsy  Brief History   85 year old female presented for outpatient bronchoscopy for newly enlarged right-sided lung mass.  Post procedural chest x-ray with questionable new apical pneumothorax, patient will be admitted under observation overnight for repeat chest x-ray in a.m.  History of present illness   Ashley Parker is an 85 year old female with a past medical history significant for tobacco abuse who presented for outpatient bronchoscopy due to enlarged right sided lung mass involving the mediastinum hilum and mediastinal adenopathy.  No obvious complications during procedure, postprocedure chest x-ray with evidence of small probable left apical pneumothorax.  Dr. Valeta Harms made aware of chest x-ray results with decision to admit patient under observation for monitoring overnight and application of supplemental oxygen.  Past Medical History  Hypertension COPD Basal cell carcinoma  vitamin D deficiency Osteoporosis Obesity  Significant Hospital Events   12/3 outpatient bronchoscopy with development of probable left apical pneumothorax post procedure  Consults:    Procedures:  Bronchoscopy 12/3  Significant Diagnostic Tests:  Chest x-ray 12/3 > small probable left apical pneumothorax , interval increase in size of RIGHT upper lobe mass since 2019 with relatively stable appearance of previously identified LEFT upper lobe mass.  Micro Data:    Antimicrobials:    Interim history/subjective:  Sitting up in recliner post procedure in PACU, states she feels well with no subjective dyspnea.  Educated on reason for observation admission and verbalized understanding  Objective   Blood pressure (!) 155/64, pulse 99, temperature (!) 97.5 F (36.4 C), resp. rate 18, height 5' 5.5" (1.664 m), weight 105.3 kg,  SpO2 100 %.        Intake/Output Summary (Last 24 hours) at 01/31/2019 1830 Last data filed at 01/31/2019 1800 Gross per 24 hour  Intake 740 ml  Output 15 ml  Net 725 ml   Filed Weights   01/31/19 1216  Weight: 105.3 kg    Examination: General: Very pleasant elderly female in no acute distress  HEENT: [place]/AT, MM pink/moist, PERRL,  Neuro: Completely alert and oriented, able to follow all commands nonfocal CV: s1s2 regular rate and rhythm, no murmur, rubs, or gallops,  PULM: Clear to auscultation bilaterally, no added breath sounds, no increased work of breathing GI: soft, bowel sounds active in all 4 quadrants, non-tender, non-distended,  Extremities: warm/dry, no edema  Skin: no rashes or lesions   Resolved Hospital Problem list     Assessment & Plan:  Probable left apical pneumothorax post bronchoscopy -Outpatient bronchoscopy performed this morning 38/3, no acute complications during procedure.  Post procedural chest x-ray revealed probable left apical pneumothorax P: Continue supplemental oxygen overnight Obtain chest x-ray in a.m. Avoid any straining or excessive coughing overnight Can likely discharge tomorrow if chest x-ray remains stable  Best practice:  Diet: Heart healthy diet Pain/Anxiety/Delirium protocol (if indicated): Nonapplicable VAP protocol (if indicated): Not applicable DVT prophylaxis: SCDs GI prophylaxis: Not applicable Glucose control: Monitor Mobility: Up with assistance Code Status: Full code Family Communication: Daughter and spouse updated at bedside Disposition: Medical telemetry  Labs   CBC: Recent Labs  Lab 01/30/19 1354  WBC 9.2  HGB 15.2*  HCT 47.6*  MCV 90.8  PLT 291    Basic Metabolic Panel: Recent Labs  Lab 01/30/19 1354  NA 139  K 4.2  CL 104  CO2 22  GLUCOSE 124*  BUN 18  CREATININE 0.82  CALCIUM 9.9   GFR: Estimated Creatinine Clearance: 61 mL/min (by C-G formula based on SCr of 0.82 mg/dL). Recent Labs   Lab 01/30/19 1354  WBC 9.2    Liver Function Tests: Recent Labs  Lab 01/30/19 1354  AST 41  ALT 55*  ALKPHOS 98  BILITOT 0.8  PROT 7.0  ALBUMIN 3.8   No results for input(s): LIPASE, AMYLASE in the last 168 hours. No results for input(s): AMMONIA in the last 168 hours.  ABG    Component Value Date/Time   TCO2 24 01/21/2009 0952     Coagulation Profile: Recent Labs  Lab 01/30/19 1354  INR 1.0    Cardiac Enzymes: No results for input(s): CKTOTAL, CKMB, CKMBINDEX, TROPONINI in the last 168 hours.  HbA1C: Hgb A1c MFr Bld  Date/Time Value Ref Range Status  06/18/2010 08:37 AM 5.8 4.6 - 6.5 % Final    Comment:    Glycemic Control Guidelines for People with Diabetes:Non Diabetic:  <6%Goal of Therapy: <7%Additional Action Suggested:  >8%   01/20/2009 12:00 AM 5.9 4.6 - 6.5 % Final    Comment:    See lab report for associated comment(s)    CBG: Recent Labs  Lab 01/31/19 1612  GLUCAP 124*    Review of Systems: Positives in bold  Gen: Denies fever, chills, weight change, fatigue, night sweats HEENT: Denies blurred vision, double vision, hearing loss, tinnitus, sinus congestion, rhinorrhea, sore throat, neck stiffness, dysphagia PULM: Denies shortness of breath, cough, sputum production, hemoptysis, wheezing CV: Denies chest pain, edema, orthopnea, paroxysmal nocturnal dyspnea, palpitations GI: Denies abdominal pain, nausea, vomiting, diarrhea, hematochezia, melena, constipation, change in bowel habits GU: Denies dysuria, hematuria, polyuria, oliguria, urethral discharge Endocrine: Denies hot or cold intolerance, polyuria, polyphagia or appetite change Derm: Denies rash, dry skin, scaling or peeling skin change Heme: Denies easy bruising, bleeding, bleeding gums Neuro: Denies headache, numbness, weakness, slurred speech, loss of memory or consciousness  Past Medical History  She,  has a past medical history of Arthritis, Basal cell carcinoma (BCC) of skin of  face (2016), COPD (chronic obstructive pulmonary disease) ([place]), Crohn's ileitis ([place]), Fundic gland polyposis of stomach, GERD (gastroesophageal reflux disease), Glaucoma, Hemorrhoids, History of hiatal hernia, Hypertension, Macular degeneration, Obesity, unspecified, Osteoporosis, Pelvic fracture ([place]) (2007), Stricture of esophagus, and Unspecified vitamin D deficiency.   Surgical History    Past Surgical History:  Procedure Laterality Date  . APPENDECTOMY    . BACK SURGERY    . CATARACT EXTRACTION    . COLONOSCOPY  07/27/2001, 07/08/2011   Multiple  . ESOPHAGEAL DILATION  2013  . ESOPHAGOGASTRODUODENOSCOPY     multiple  . LAPAROSCOPIC APPENDECTOMY N/A 02/19/2014   Procedure: APPENDECTOMY LAPAROSCOPIC;  Surgeon: Erroll Luna, MD;  Location: [facility];  Service: General;  Laterality: N/A;  . MINOR PLACEMENT OF FIDUCIAL  08/23/2017   Procedure: MINOR PLACEMENT OF FIDUCIAL - Left Upper Lobe Lung x2, Right Upper Lobe Lung x3;  Surgeon: Collene Gobble, MD;  Location: [facility];  Service: Thoracic;;  . TONSILLECTOMY AND ADENOIDECTOMY     and adenoids   . TOTAL KNEE ARTHROPLASTY Left 2007  . VIDEO BRONCHOSCOPY WITH ENDOBRONCHIAL NAVIGATION N/A 08/23/2017   Procedure: VIDEO BRONCHOSCOPY WITH ENDOBRONCHIAL NAVIGATION;  Surgeon: Collene Gobble, MD;  Location: [facility];  Service: Thoracic;  Laterality: N/A;  . WRIST FRACTURE SURGERY Left 2018   with screws     Social History   reports that she quit smoking about 37 years  ago. Her smoking use included cigarettes. She has a 32.00 pack-year smoking history. She has never used smokeless tobacco. She reports current alcohol use. She reports that she does not use drugs.   Family History   Her family history includes Breast cancer in her mother; Heart disease in her mother; Prostate cancer in her father; Uterine cancer in her mother. There is no history of Colon cancer.   Allergies Allergies  Allergen Reactions  . Wasp Venom Anaphylaxis and Other (See  Comments)    Severe pain in lower stomach, sweatiness, and BP drops low   . Biaxin [Clarithromycin] Other (See Comments)    MYALGIAS ACHES  . Brimonidine Dermatitis and Other (See Comments)    Redness/Peeling of skin     Home Medications  Prior to Admission medications   Medication Sig Start Date End Date Taking? Authorizing Provider  Ascorbic Acid (VITAMIN C) 500 MG CHEW Chew 500 mg by mouth daily.   Yes [provider]  B Complex-C (SUPER B COMPLEX PO) Take 1 tablet by mouth daily.   Yes [provider]  Calcium Carb-Cholecalciferol (CALCIUM 600+D3 PO) Take 1-2 tablets by mouth See admin instructions. Take 1 tablet in the morning & 2 tablet at night   Yes [provider]  calcium elemental as carbonate (BARIATRIC TUMS ULTRA) 400 MG chewable tablet Chew 2,000 mg by mouth at bedtime.   Yes [provider]  Cholecalciferol (VITAMIN D3) 2000 units TABS Take 2,000 Units by mouth at bedtime as needed (indigestion).    Yes [provider]  diclofenac Sodium (VOLTAREN) 1 % GEL Apply 1 application topically 4 (four) times daily as needed (pain.).  01/07/19  Yes [provider]  dorzolamide (TRUSOPT) 2 % ophthalmic solution Place 1 drop into the left eye 2 (two) times daily.    Yes [provider]  EPINEPHrine 0.3 mg/0.3 mL IJ SOAJ injection Inject 0.3 mg into the muscle as needed (for allergic reaction).   Yes [provider]  Flaxseed, Linseed, (FLAXSEED OIL PO) Take 1 tablet by mouth daily.   Yes [provider]  fluticasone (FLONASE) 50 MCG/ACT nasal spray Place 1 spray into both nostrils daily as needed for allergies.    Yes [provider]  irbesartan (AVAPRO) 300 MG tablet Take 300 mg by mouth daily.    Yes [provider]  LUMIGAN 0.01 % SOLN Place 1 drop into the right eye at bedtime.  03/04/15  Yes [provider]  Magnesium 250 MG TABS Take 250 mg by mouth daily.   Yes [provider]  Multiple Vitamins-Minerals (LUTEIN-ZEAXANTHIN PO) Take 1 tablet by mouth daily.   Yes [provider]  Multiple Vitamins-Minerals (PRESERVISION AREDS 2 PO) Take 1 tablet by mouth 2 (two) times daily.   Yes [provider]  Omega-3 Fatty Acids (FISH OIL) 1000 MG CAPS Take 1,000 mg by mouth daily.   Yes [provider]  Polyethyl Glycol-Propyl Glycol (SYSTANE) 0.4-0.3 % SOLN Place 1 drop into both eyes 3 (three) times daily as needed (for tired/dry eyes.).    Yes [provider]  timolol (TIMOPTIC) 0.5 % ophthalmic solution Place 1 drop into the left eye 2 (two) times daily. 01/01/19  Yes [provider]  TURMERIC PO Take 1,500 mg by mouth daily.   Yes [provider]  vitamin B-12 (CYANOCOBALAMIN) 1000 MCG tablet Take 1,000 mcg by mouth daily.   Yes [provider]  vitamin E 400 UNIT capsule Take 400 Units  by mouth daily. (180 mg)   Yes [provider]  Zinc 30 MG TABS Take 30 mg by mouth daily.   Yes [provider]     Critical care time:     Johnsie Cancel, NP-C [facility] After hours pager: [phone]. 01/31/2019, 6:39 PM

## 2019-01-31 NOTE — Progress Notes (Signed)
Loree Fee, NP at bedside to see patient and update patient's daughter/husband on the plan to admit her for overnight observation.  Patient and family agreeable to this plan. Will continue to monitor.

## 2019-01-31 NOTE — Op Note (Signed)
Video Bronchoscopy with Electromagnetic Navigation Procedure Note Video Bronchoscopy with Endobronchial Ultrasound Procedure Note  Date of Operation: 01/31/2019  Pre-op Diagnosis: Left upper lobe lung nodule, right hilar mass, mediastinal adenopathy  Post-op Diagnosis: Left upper lobe lung nodule, right hilar mass, mediastinal adenopathy  Surgeon: Garner Nash, DO   Assistants: None   Anesthesia: General endotracheal anesthesia  Operation: Flexible video fiberoptic bronchoscopy with electromagnetic navigation and biopsies.  Estimated Blood Loss: Minimal, <0HK   Complications: None   Indications and History: Ashley Parker is a 85 y.o. female with left upper lobe lung nodule, right hilar mass, mediastinal adenopathy.  The risks, benefits, complications, treatment options and expected outcomes were discussed with the patient.  The possibilities of pneumothorax, pneumonia, reaction to medication, pulmonary aspiration, perforation of a viscus, bleeding, failure to diagnose a condition and creating a complication requiring transfusion or operation were discussed with the patient who freely signed the consent.    Description of Procedure: The patient was seen in the Preoperative Area, was examined and was deemed appropriate to proceed.  The patient was taken to the OR 12, identified as Ashley Parker and the procedure verified as Flexible Video Fiberoptic Bronchoscopy.  A Time Out was held and the above information confirmed.   Prior to the date of the procedure a high-resolution CT scan of the chest was performed. Utilizing Sullivan a virtual tracheobronchial tree was generated to allow the creation of distinct navigation pathways to the patient's parenchymal abnormalities. After being taken to the operating room general anesthesia was initiated and the patient  was orally intubated. The video fiberoptic bronchoscope was introduced via the endotracheal tube and a general  inspection was performed which showed normal right and left lung anatomy except for extrinsic compression of the right upper lobe as well as right middle lobe openings for airways.  Also in the left mainstem there was 3-4 scattered lesions within the airway appearing as small metastatic tumorlet of which were less than 3 mm in size.  The extendable working channel and locator guide were introduced into the bronchoscope. The distinct navigation pathways prepared prior to this procedure were then utilized to navigate to within 2.5 cm of patient's lesion(s) identified on CT scan.  It was very difficult to get into the posterior segment of the left upper lobe.  The extendable working channel was secured into place and the locator guide was withdrawn. Under fluoroscopic guidance transbronchial needle brushings,and transbronchial forceps biopsies were performed to be sent for cytology and pathology. A bronchioalveolar lavage was performed in the LUL and sent for cytology and microbiology (bacterial, fungal, AFB smears and cultures). At the end of the procedure a general airway inspection was performed and there was no evidence of active bleeding. The bronchoscope was removed.  The patient tolerated the procedure well. There was no significant blood loss and there were no obvious complications.  Olympus endobronchial ultrasound.The standard scope was then withdrawn and the endobronchial ultrasound was used to identify and characterize the peritracheal, hilar and bronchial lymph nodes. Inspection showed enlarged station 7 as well as station 4R lymph node.. Using real-time ultrasound guidance Wang needle biopsies were take from Station 7 and 4R nodes and were sent for cytology. The patient tolerated the procedure well without apparent complications. There was no significant blood loss.  Following the endobronchial ultrasound component of the procedure we switched to the standard viewing scope right last time to suction away  bloody secretions from the patient's airway.  At this  time we used the regular alligator forceps to biopsy the small 3 mA tumorlet lesions within the left mainstem. The bronchoscope was withdrawn. Anesthesia was reversed and the patient was taken to the PACU for recovery.    Samples: 1. Transbronchial needle brushings from left upper lobe 2. Transbronchial forceps biopsies from left upper lobe 3. Bronchoalveolar lavage from upper lobe 4. Endobronchial biopsies from left mainstem 5.  Transbronchial needle aspiration biopsy station 7, 2 slide, 6 cellblock 6.  Transbronchial needle aspiration biopsy station 4R, 1 slide, 5 cellblock  Plans:  The patient will be discharged from the PACU to home when recovered from anesthesia and after chest x-ray is reviewed. We will review the cytology, pathology and microbiology results with the patient when they become available. Outpatient followup will be with Baltazar Apo, MD.   Preliminary pathology: Patient for our adequate for tissue diagnosis. Concerning for malignancy  Garner Nash, DO [facility] 01/31/2019 4:18 PM

## 2019-01-31 NOTE — Anesthesia Preprocedure Evaluation (Addendum)
Anesthesia Evaluation  Patient identified by MRN, date of birth, ID band Patient awake    Reviewed: Allergy & Precautions, NPO status , Patient's Chart, lab work & pertinent test results  History of Anesthesia Complications Negative for: history of anesthetic complications  Airway Mallampati: I  TM Distance: >3 FB Neck ROM: Full    Dental  (+) Edentulous Upper, Edentulous Lower, Dental Advisory Given   Pulmonary COPD, former smoker,    Pulmonary exam normal        Cardiovascular hypertension, Normal cardiovascular exam     Neuro/Psych negative neurological ROS     GI/Hepatic Neg liver ROS, hiatal hernia, GERD  ,  Endo/Other  negative endocrine ROS  Renal/GU negative Renal ROS     Musculoskeletal negative musculoskeletal ROS (+)   Abdominal   Peds  Hematology negative hematology ROS (+)   Anesthesia Other Findings Day of surgery medications reviewed with the patient.  Reproductive/Obstetrics                            Anesthesia Physical Anesthesia Plan  ASA: III  Anesthesia Plan: General   Post-op Pain Management:    Induction:   PONV Risk Score and Plan: 3 and Ondansetron, Dexamethasone and Diphenhydramine  Airway Management Planned: Oral ETT  Additional Equipment:   Intra-op Plan:   Post-operative Plan: Extubation in OR  Informed Consent: I have reviewed the patients History and Physical, chart, labs and discussed the procedure including the risks, benefits and alternatives for the proposed anesthesia with the patient or authorized representative who has indicated his/her understanding and acceptance.     Dental advisory given  Plan Discussed with: Anesthesiologist and CRNA  Anesthesia Plan Comments:        Anesthesia Quick Evaluation

## 2019-01-31 NOTE — Anesthesia Procedure Notes (Signed)
Procedure Name: Intubation Date/Time: 01/31/2019 2:07 PM Performed by: Eulas Post, Izza Bickle W, CRNA Pre-anesthesia Checklist: Patient identified, Emergency Drugs available, Suction available and Patient being monitored Patient Re-evaluated:Patient Re-evaluated prior to induction Oxygen Delivery Method: Circle system utilized Preoxygenation: Pre-oxygenation with 100% oxygen Induction Type: IV induction Ventilation: Mask ventilation without difficulty Laryngoscope Size: Miller and 2 Grade View: Grade I Tube type: Oral Tube size: 8.5 mm Number of attempts: 1 Airway Equipment and Method: Stylet and Oral airway Placement Confirmation: ETT inserted through vocal cords under direct vision,  positive ETCO2 and breath sounds checked- equal and bilateral Secured at: 24 cm Tube secured with: Tape Dental Injury: Teeth and Oropharynx as per pre-operative assessment

## 2019-01-31 NOTE — Progress Notes (Signed)
Received call from radiologist regarding possible small left apical pneumothorax on patient's post-op chest x-ray.  Patient is currently resting comfortably with O2 Sats 100% on room air.  Dr. Valeta Harms was notified of this finding. Awaiting his call back for further orders.

## 2019-01-31 NOTE — Progress Notes (Signed)
PCCM:  CXR reviewed, report reviewed. Small apical pneumothorax on left. Patient stable 100% on room air. We will give 100% O2 on NRB. Repeat CXR in 2 hours. If decreasing or absent on repeat CXR can still discharge with plans for office visit tomorrow and repeat CXR in office.   If enlarging patient can be admitted under observation with O2 therapy.   Case discussed with Merlene Laughter, NP who will stop by and see patient.   [facility] 01/31/2019 5:47 PM

## 2019-01-31 NOTE — H&P (Signed)
Synopsis: Referred in December 2020 for lung mass, lung nodule by No ref. provider found  Subjective:   PATIENT ID: Ashley Parker GENDER: female DOB: [birth year], MRN: [id number]   85 year old female past medical history of tobacco abuse.  Presents for evaluation of newly enlarged right-sided lung mass involving the mediastinum hilum and mediastinal adenopathy.  In addition has a persistent left-sided upper lobe posterior nodule.  Patient seen today in preop prior to bronchoscopy.  All questions answered regarding plans for video bronchoscopy with endobronchial ultrasound, transbronchial needle aspiration biopsies as well as plans for navigational bronchoscopy to the upper lobe nodule and transbronchial biopsies.   Past Medical History:  Diagnosis Date  . Arthritis   . Basal cell carcinoma (BCC) of skin of face 2016   removed in 2016  . COPD (chronic obstructive pulmonary disease) (HCC)    Mild  . Crohn's ileitis ([place])    Mild, mostly asymptomatic, not on therapy  . Fundic gland polyposis of stomach   . GERD (gastroesophageal reflux disease)   . Glaucoma   . Hemorrhoids    Internal and External  . History of hiatal hernia   . Hypertension   . Macular degeneration   . Obesity, unspecified   . Osteoporosis   . Pelvic fracture ([place]) 2007  . Stricture of esophagus    distal  . Unspecified vitamin D deficiency      Family History  Problem Relation Age of Onset  . Prostate cancer Father   . Breast cancer Mother   . Heart disease Mother   . Uterine cancer Mother   . Colon cancer Neg Hx      Past Surgical History:  Procedure Laterality Date  . APPENDECTOMY    . BACK SURGERY    . CATARACT EXTRACTION    . COLONOSCOPY  07/27/2001, 07/08/2011   Multiple  . ESOPHAGEAL DILATION  2013  . ESOPHAGOGASTRODUODENOSCOPY     multiple  . LAPAROSCOPIC APPENDECTOMY N/A 02/19/2014   Procedure: APPENDECTOMY LAPAROSCOPIC;  Surgeon: Erroll Luna, MD;  Location: [facility];  Service:  General;  Laterality: N/A;  . MINOR PLACEMENT OF FIDUCIAL  08/23/2017   Procedure: MINOR PLACEMENT OF FIDUCIAL - Left Upper Lobe Lung x2, Right Upper Lobe Lung x3;  Surgeon: Collene Gobble, MD;  Location: [facility];  Service: Thoracic;;  . TONSILLECTOMY AND ADENOIDECTOMY     and adenoids   . TOTAL KNEE ARTHROPLASTY Left 2007  . VIDEO BRONCHOSCOPY WITH ENDOBRONCHIAL NAVIGATION N/A 08/23/2017   Procedure: VIDEO BRONCHOSCOPY WITH ENDOBRONCHIAL NAVIGATION;  Surgeon: Collene Gobble, MD;  Location: [facility];  Service: Thoracic;  Laterality: N/A;  . WRIST FRACTURE SURGERY Left 2018   with screws    Social History   Socioeconomic History  . Marital status: Married    Spouse name: Herbie Baltimore  . Number of children: 5  . Years of education: 74  . Highest education level: Not on file  Occupational History  . Occupation: Retired  Scientific laboratory technician  . Financial resource strain: Not on file  . Food insecurity    Worry: Not on file    Inability: Not on file  . Transportation needs    Medical: Not on file    Non-medical: Not on file  Tobacco Use  . Smoking status: Former Smoker    Packs/day: 1.00    Years: 32.00    Pack years: 32.00    Types: Cigarettes    Quit date: 02/18/1981    Years since quitting: 37.9  .  Smokeless tobacco: Never Used  Substance and Sexual Activity  . Alcohol use: Yes    Alcohol/week: 0.0 standard drinks    Comment: 1 drink daily   . Drug use: No  . Sexual activity: Not Currently  Lifestyle  . Physical activity    Days per week: Not on file    Minutes per session: Not on file  . Stress: Not on file  Relationships  . Social Herbalist on phone: Not on file    Gets together: Not on file    Attends religious service: Not on file    Active member of club or organization: Not on file    Attends meetings of clubs or organizations: Not on file    Relationship status: Not on file  . Intimate partner violence    Fear of current or ex partner: Not on file     Emotionally abused: Not on file    Physically abused: Not on file    Forced sexual activity: Not on file  Other Topics Concern  . Not on file  Social History Narrative   Regular exercise-yes - aqua aerobics, walking 1hr x 5 each week   Married   5 children, 22 grandchildren, worked in past as garden Building services engineer, Education officer, community, Electrical engineer   Daily caffeine x 2     Allergies  Allergen Reactions  . Wasp Venom Anaphylaxis and Other (See Comments)    Severe pain in lower stomach, sweatiness, and BP drops low   . Biaxin [Clarithromycin] Other (See Comments)    MYALGIAS ACHES  . Brimonidine Dermatitis and Other (See Comments)    Redness/Peeling of skin      Review of Systems  Constitutional: Negative for chills, fever, malaise/fatigue and weight loss.  HENT: Negative for hearing loss, sore throat and tinnitus.   Eyes: Negative for blurred vision and double vision.  Respiratory: Positive for cough, sputum production and shortness of breath. Negative for hemoptysis, wheezing and stridor.   Cardiovascular: Negative for chest pain, palpitations, orthopnea, leg swelling and PND.  Gastrointestinal: Negative for abdominal pain, constipation, diarrhea, heartburn, nausea and vomiting.  Genitourinary: Negative for dysuria, hematuria and urgency.  Musculoskeletal: Negative for joint pain and myalgias.  Skin: Negative for itching and rash.  Neurological: Negative for dizziness, tingling, weakness and headaches.  Endo/Heme/Allergies: Negative for environmental allergies. Does not bruise/bleed easily.  Psychiatric/Behavioral: Negative for depression. The patient is not nervous/anxious and does not have insomnia.   All other systems reviewed and are negative.    Objective:  Physical Exam Vitals signs reviewed.  Constitutional:      General: She is not in acute distress.    Appearance: She is well-developed. She is obese.  HENT:     Head: Normocephalic and atraumatic.  Eyes:     General:  No scleral icterus.    Conjunctiva/sclera: Conjunctivae normal.     Pupils: Pupils are equal, round, and reactive to light.  Neck:     Musculoskeletal: Neck supple.     Vascular: No JVD.     Trachea: No tracheal deviation.  Cardiovascular:     Rate and Rhythm: Normal rate and regular rhythm.     Heart sounds: Normal heart sounds. No murmur.  Pulmonary:     Effort: Pulmonary effort is normal. No tachypnea, accessory muscle usage or respiratory distress.     Breath sounds: Normal breath sounds. No stridor. No wheezing, rhonchi or rales.  Abdominal:     General: Bowel sounds are normal.  There is no distension.     Palpations: Abdomen is soft.     Tenderness: There is no abdominal tenderness.  Musculoskeletal:        General: No tenderness.  Lymphadenopathy:     Cervical: No cervical adenopathy.  Skin:    General: Skin is warm and dry.     Capillary Refill: Capillary refill takes less than 2 seconds.     Findings: No rash.  Neurological:     Mental Status: She is alert and oriented to person, place, and time.  Psychiatric:        Behavior: Behavior normal.      Vitals:   01/31/19 1202 01/31/19 1216  BP: (!) 177/67   Pulse: 86   Resp: 20   Temp: 98.9 F (37.2 C)   TempSrc: Oral   SpO2: 96%   Weight:  105.3 kg  Height: 5' 5.5" (1.664 m)    96% on RA BMI Readings from Last 3 Encounters:  01/31/19 38.03 kg/m  01/30/19 38.03 kg/m  01/09/19 38.44 kg/m   Wt Readings from Last 3 Encounters:  01/31/19 105.3 kg  01/30/19 105.3 kg  01/09/19 104.8 kg     CBC    Component Value Date/Time   WBC 9.2 01/30/2019 1354   RBC 5.24 (H) 01/30/2019 1354   HGB 15.2 (H) 01/30/2019 1354   HCT 47.6 (H) 01/30/2019 1354   PLT 316 01/30/2019 1354   MCV 90.8 01/30/2019 1354   MCH 29.0 01/30/2019 1354   MCHC 31.9 01/30/2019 1354   RDW 13.3 01/30/2019 1354   LYMPHSABS 1.5 06/08/2015 0157   MONOABS 0.7 06/08/2015 0157   EOSABS 0.3 06/08/2015 0157   BASOSABS 0.0 06/08/2015 0157      Chest Imaging:  IMPRESSION: 1. Dramatic progression of disease with enlargement of the previously noted right upper lobe nodule which is now a large mass-like area measuring 9.6 x 6.6 x 5.4 cm, with surrounding changes concerning for developing lymphangitic spread of disease. This is also associated with progressive mediastinal lymphadenopathy.  Pulmonary Functions Testing Results: PFT Results Latest Ref Rng & Units 07/07/2014  FVC-Pre L 2.96  FVC-Predicted Pre % 105  FVC-Post L 2.97  FVC-Predicted Post % 105  Pre FEV1/FVC % % 64  Post FEV1/FCV % % 70  FEV1-Pre L 1.89  FEV1-Predicted Pre % 90  FEV1-Post L 2.09  DLCO UNC% % 63  DLCO COR %Predicted % 73  TLC L 5.37  TLC % Predicted % 100  RV % Predicted % 91      Assessment & Plan:    Large right upper lobe lung mass concerning for lymphangitic spread, mediastinal involvement.  Enlarged mediastinal adenopathy Persistent left upper lobe lung nodule 1.2 x 0.9 x 1.8 cm.  Plan:   Video endobronchial ultrasound with transbronchial needle aspirations of mediastinal mass and right upper lobe lung mass. Video bronchoscopy with navigation to the left upper lobe nodule Prior to the procedure in preop we discussed the risk, benefits and alternatives of proceeding.  Patient's all questions were answered. No barriers to proceed.   Current Facility-Administered Medications:  .  lactated ringers infusion, , Intravenous, Continuous, Duane Boston, MD, Last Rate: 10 mL/hr at 01/31/19 [place], DO [facility] 01/31/2019 1:48 PM

## 2019-01-31 NOTE — Progress Notes (Signed)
Coughing readily, bilau UA insp/exp wheezing, Dr Hopdiernecalled/ orders for Albuterol neb

## 2019-01-31 NOTE — Discharge Instructions (Signed)
Flexible Bronchoscopy, Care After This sheet gives you information about how to care for yourself after your test. Your doctor may also give you more specific instructions. If you have problems or questions, contact your doctor. Follow these instructions at home: Eating and drinking  The day after the test, go back to your normal diet. Driving  Do not drive for 24 hours if you were given a medicine to help you relax (sedative).  Do not drive or use heavy machinery while taking prescription pain medicine. General instructions   Take over-the-counter and prescription medicines only as told by your doctor.  Return to your normal activities as told. Ask what activities are safe for you.  Do not use any products that have nicotine or tobacco in them. This includes cigarettes and e-cigarettes. If you need help quitting, ask your doctor.  Keep all follow-up visits as told by your doctor. This is important. It is very important if you had a tissue sample (biopsy) taken. Get help right away if:  You have shortness of breath that gets worse.  You get light-headed.  You feel like you are going to pass out (faint).  You have chest pain.  You cough up: ? More than a little blood. ? More blood than before. Summary  Do not eat or drink anything (not even water) for 2 hours after your test, or until your numbing medicine wears off.  Do not use cigarettes. Do not use e-cigarettes.  Get help right away if you have chest pain. This information is not intended to replace advice given to you by your health care provider. Make sure you discuss any questions you have with your health care provider. Document Released: 12/12/2008 Document Revised: 01/27/2017 Document Reviewed: 03/04/2016 Elsevier Patient Education  2020 [employer].

## 2019-02-01 ENCOUNTER — Telehealth: Payer: Self-pay | Admitting: Internal Medicine

## 2019-02-01 ENCOUNTER — Observation Stay (HOSPITAL_COMMUNITY): Payer: PPO

## 2019-02-01 ENCOUNTER — Encounter (HOSPITAL_COMMUNITY): Payer: Self-pay | Admitting: Pulmonary Disease

## 2019-02-01 DIAGNOSIS — R918 Other nonspecific abnormal finding of lung field: Secondary | ICD-10-CM | POA: Diagnosis not present

## 2019-02-01 DIAGNOSIS — C3490 Malignant neoplasm of unspecified part of unspecified bronchus or lung: Secondary | ICD-10-CM | POA: Diagnosis not present

## 2019-02-01 DIAGNOSIS — J95811 Postprocedural pneumothorax: Secondary | ICD-10-CM | POA: Diagnosis not present

## 2019-02-01 DIAGNOSIS — J939 Pneumothorax, unspecified: Secondary | ICD-10-CM | POA: Diagnosis not present

## 2019-02-01 DIAGNOSIS — C3412 Malignant neoplasm of upper lobe, left bronchus or lung: Secondary | ICD-10-CM | POA: Diagnosis not present

## 2019-02-01 LAB — CBC
HCT: 45.9 % (ref 36.0–46.0)
Hemoglobin: 15 g/dL (ref 12.0–15.0)
MCH: 29.1 pg (ref 26.0–34.0)
MCHC: 32.7 g/dL (ref 30.0–36.0)
MCV: 89 fL (ref 80.0–100.0)
Platelets: 281 10*3/uL (ref 150–400)
RBC: 5.16 MIL/uL — ABNORMAL HIGH (ref 3.87–5.11)
RDW: 13.3 % (ref 11.5–15.5)
WBC: 12.7 10*3/uL — ABNORMAL HIGH (ref 4.0–10.5)
nRBC: 0 % (ref 0.0–0.2)

## 2019-02-01 LAB — BASIC METABOLIC PANEL
Anion gap: 11 (ref 5–15)
BUN: 14 mg/dL (ref 8–23)
CO2: 22 mmol/L (ref 22–32)
Calcium: 9 mg/dL (ref 8.9–10.3)
Chloride: 104 mmol/L (ref 98–111)
Creatinine, Ser: 0.81 mg/dL (ref 0.44–1.00)
GFR calc Af Amer: >60 mL/min (ref >60)
GFR calc non Af Amer: >60 mL/min (ref >60)
Glucose, Bld: 159 mg/dL — ABNORMAL HIGH (ref 70–99)
Potassium: 4.6 mmol/L (ref 3.5–5.1)
Sodium: 137 mmol/L (ref 135–145)

## 2019-02-01 LAB — ACID FAST SMEAR (AFB, MYCOBACTERIA): Acid Fast Smear: NEGATIVE

## 2019-02-01 MED ORDER — PREDNISONE 10 MG PO TABS: ORAL_TABLET | ORAL | 0 refills | Status: DC

## 2019-02-01 MED ORDER — LEVALBUTEROL HCL 0.63 MG/3ML IN NEBU
INHALATION_SOLUTION | RESPIRATORY_TRACT | Status: AC
  Administered 2019-02-01: 10:00:00 0.63 mg via RESPIRATORY_TRACT
  Filled 2019-02-01: qty 3

## 2019-02-01 MED ORDER — LEVALBUTEROL HCL 0.63 MG/3ML IN NEBU
0.6300 mg | INHALATION_SOLUTION | Freq: Once | RESPIRATORY_TRACT | Status: AC
  Administered 2019-02-01: 10:00:00 0.63 mg via RESPIRATORY_TRACT

## 2019-02-01 NOTE — Anesthesia Postprocedure Evaluation (Signed)
Anesthesia Post Note  Patient: Ashley Parker  Procedure(s) Performed: VIDEO BRONCHOSCOPY WITH ENDOBRONCHIAL ULTRASOUND (N/A ) VIDEO BRONCHOSCOPY WITH ENDOBRONCHIAL NAVIGATION (N/A )     Patient location during evaluation: PACU Anesthesia Type: General Level of consciousness: awake and alert Pain management: pain level controlled Vital Signs Assessment: post-procedure vital signs reviewed and stable Respiratory status: spontaneous breathing, nonlabored ventilation, respiratory function stable and patient connected to nasal cannula oxygen Cardiovascular status: blood pressure returned to baseline and stable Postop Assessment: no apparent nausea or vomiting Anesthetic complications: no    Last Vitals:  Vitals:   02/01/19 0933 02/01/19 1045  BP:  (!) 128/51  Pulse: 82 84  Resp: 11 17  Temp:  (!) 36.4 C  SpO2: 100% 100%    Last Pain:  Vitals:   02/01/19 1045  TempSrc: Oral  PainSc:                  [place] S

## 2019-02-01 NOTE — Telephone Encounter (Signed)
Received a new patient referral from Dr. Valeta Harms for lung mass. Ashley Parker has been cld and scheduled to see Dr. Julien Nordmann on 12/8 at 2:15pm w/labs at 1:45pm. Pt aware to arrive 15 minutes early.

## 2019-02-01 NOTE — Discharge Summary (Addendum)
Physician Discharge Summary         Patient ID: Ashley Parker MRN: [id number] DOB/AGE: 1931/05/09 85 y.o.  Admit date: 01/31/2019 Discharge date: 02/01/2019  Discharge Diagnoses:   Probable left apical pneumothorax post bronchoscopy 12/3 Newly enlarged right-sided lung mass.  Hypertension COPD Basal cell carcinoma  vitamin D deficiency Osteoporosis Obesity Elevated AST  Discharge summary   Ashley Parker is an 85 year old female with a past medical history significant for tobacco abuse who presented for outpatient bronchoscopy 01/31/2019  due to enlarged right sided lung mass involving the mediastinum hilum and mediastinal adenopathy.  No obvious complications during procedure, postprocedure chest x-ray with evidence of small probable left apical pneumothorax.  Dr. Valeta Harms made aware of chest x-ray results with decision to admit patient under observation for monitoring overnight and application of supplemental oxygen.Pt. was treated with supplemental oxygen overnight. Small apical pneumothorax shows resolution on CXR 12/4. Pt denies any dyspnea. Few blood tinged secretions noted overnight, which patient states are resolving. She does have some wheezing on exam . She has reached maximal inpatient benefit. She will be discharged today with a prednisone taper and follow up in the office next week with CXR ( February 06, 2019 at 1:30 pm.). Additionally she has follow up with Dr. Valeta Harms 02/14/2019 at 9:45 am.   Discharge Plan by Active Problems   Probable left apical pneumothorax post bronchoscopy Outpatient bronchoscopy performed 76/2, no acute complications during procedure.   Post procedural chest x-ray revealed probable left apical pneumothorax Treated with oxygen 100% non-rebreather overnight  Resolved per CXR 12.4.2020 No subjective dyspnea Wheezing bilaterally on exam 12/4  P: Will discharge home as apical pneumothorax has resolved per CXR 12/4 CXR reviewed by Dr. Valeta Harms    Encourage to avoid any straining or excessive coughing once she goes home Will send home on prednisone taper x 5 days Consider resuming Arnuity if she continues to wheeze Albuterol Inhaler prn for wheezing Follow up appointment in the office 02/06/2019 at 1:30 pm with CXR and Labs   Leukocytosis post procedure T Max 98.9 Plan Follow up in office 12/9 with CBC Consider home antibiotics for prophylaxis Monitor for  fever at home Pulmonary toilet at home, up in chair , ambulate as able  Elevated ALT Plan Follow up LFT 02/06/2019 at hospital follow up   [facility] tests/ studies   Chest x-ray 12/3 > small probable left apical pneumothorax , interval increase in size of RIGHT upper lobe mass since 2019 with relatively stable appearance of previously identified LEFT upper lobe mass.  CXR 02/01/2019 Stable right upper lobe consolidation and fiducials. Small left upper lobe nodule with 2 fiducials. No acute overlying pulmonary process or pneumothorax.  Procedures   12/3 Bronchoscopy Culture data/antimicrobials   12/3 BAL Respiratory Culture >> Pending 12/3 BAL AFB>> Pending 12/3 AFB Fungus>> Pending 12/2  SARS Coronavirus 2 NEGATIVE NEGATIVE    Consults  None    Discharge Exam: BP (!) 138/56 (BP Location: Left Arm)    Pulse 82    Temp 97.8 F (36.6 C) (Oral)    Resp 11    Ht 5' 5.5" (1.664 m)    Wt 103.7 kg    SpO2 100%    BMI 37.47 kg/m   Examination: General: Very pleasant elderly female in no acute distress, wearing non-re-breather   HEENT: [place]/AT, MM pink/moist, PERRL,  Neuro: Completely alert and oriented, able to follow all commands nonfocal CV: s1s2 regular rate and rhythm, no murmur, rubs, or gallops,  PULM:  Bilateral chest excursion, trachea midline, + wheeze bilaterally,no increased work of breathing, productive cough for clear to scant blood tinged secretions that are clearing GI: soft, bowel sounds active in all 4 quadrants, non-tender, non-distended,  Body mass index is 37.47 kg/m. Extremities: warm/dry, no edema , no mottling, no obvious deformities Skin: no rashes or lesions, warm and dry, intact  Labs at discharge   Lab Results  Component Value Date   CREATININE 0.81 02/01/2019   BUN 14 02/01/2019   NA 137 02/01/2019   K 4.6 02/01/2019   CL 104 02/01/2019   CO2 22 02/01/2019   Lab Results  Component Value Date   WBC 12.7 (H) 02/01/2019   HGB 15.0 02/01/2019   HCT 45.9 02/01/2019   MCV 89.0 02/01/2019   PLT 281 02/01/2019   Lab Results  Component Value Date   ALT 55 (H) 01/30/2019   AST 41 01/30/2019   ALKPHOS 98 01/30/2019   BILITOT 0.8 01/30/2019   Lab Results  Component Value Date   INR 1.0 01/30/2019   INR 1.0 12/16/2007    Current radiological studies    Dg Chest Port 1 View  Result Date: 02/01/2019 CLINICAL DATA:  Lung cancer. EXAM: PORTABLE CHEST 1 VIEW COMPARISON:  01/31/2019 FINDINGS: Stable right upper lobe consolidation/mass with to fiducials in place. Small nodular lesion in the left upper lobe with 2 fiducials in place. No acute overlying pulmonary process is identified. No pneumothorax. No pleural effusions. Moderate-sized hiatal hernia. IMPRESSION: 1. Stable right upper lobe consolidation and fiducials. 2. Small left upper lobe nodule with 2 fiducials. 3. No acute overlying pulmonary process or pneumothorax. Electronically Signed   By: Marijo Sanes [name]   On: 02/01/2019 07:02   Dg Chest Port 1 View  Result Date: 01/31/2019 CLINICAL DATA:  Left pneumothorax. EXAM: PORTABLE CHEST 1 VIEW COMPARISON:  Same day. FINDINGS: The heart size and mediastinal contours are within normal limits. No pneumothorax is noted. Stable right upper lobe opacity is noted consistent with mass and associated atelectasis. Stable left upper lobe mass is noted. The visualized skeletal structures are unremarkable. IMPRESSION: No pneumothorax is noted currently. Stable right upper lobe opacity is noted consistent with mass and  associated atelectasis. Stable left upper lobe mass is noted. Electronically Signed   By: Marijo Conception [name]   On: 01/31/2019 19:42   Dg Chest Port 1 View  Result Date: 01/31/2019 CLINICAL DATA:  Post bronchoscopy, history COPD, hypertension EXAM: PORTABLE CHEST 1 VIEW COMPARISON:  Portable exam 1703 hours compared to 08/23/2017 and correlated with CT chest of 12/05/2018 FINDINGS: Normal heart size mediastinal contours. Surgical clips/markers are seen within the upper lobes bilaterally. Progressive enlargement of RIGHT upper lobe mass since 2019. Additional small nodular focus is seen within the LEFT upper lobe corresponding to a previously identified smaller mass. Rotated to the LEFT. Mild central peribronchial thickening. Subsegmental atelectasis at RIGHT base. A thin curvilinear line identified at LEFT apex suspicious for a small LEFT apical pneumothorax. No pleural effusion, definite pneumothorax or definite acute infiltrate. Bones demineralized. IMPRESSION: Interval increase in size of RIGHT upper lobe mass since 2019 with relatively stable appearance of previously identified LEFT upper lobe mass. Bronchitic changes with subsegmental atelectasis at RIGHT base. Small probable LEFT apex pneumothorax. Findings called to [facility] in PACU on 01/31/2019 at 1720 hours. Electronically Signed   By: Lavonia Dana [name]   On: 01/31/2019 17:14   Dg C-arm Bronchoscopy  Result Date: 01/31/2019 C-ARM BRONCHOSCOPY: Fluoroscopy was utilized by  the requesting physician.  No radiographic interpretation.    Disposition:    Discharge disposition: 01-Home or Self Care     Discharge home 02/01/2019  With daughter  Prednisone x 5 days, Take three 10 mg tablets today, the two 10 mg tablets x 2 days, then one 10 mg tablet x 2 days, then stop[ Use Albuterol inhaler as needed for wheezing Please contact office for sooner follow up if symptoms do not improve or worsen or seek emergency care  Call the office or seek  emergency care for coughing up blood that does not stop  Follow up 02/06/2019 at Pulmonary office with CXR and labs with Eric Form, NP  Follow up with Dr. Valeta Harms 12/17 at Pulmonary office as is scheduled     Allergies as of 02/01/2019      Reactions   Wasp Venom Anaphylaxis, Other (See Comments)   Severe pain in lower stomach, sweatiness, and BP drops low    Biaxin [clarithromycin] Other (See Comments)   MYALGIAS ACHES   Brimonidine Dermatitis, Other (See Comments)   Redness/Peeling of skin      Medication List    TAKE these medications   CALCIUM 600+D3 PO Take 1-2 tablets by mouth See admin instructions. Take 1 tablet in the morning & 2 tablet at night   calcium elemental as carbonate 400 MG chewable tablet Commonly known as: BARIATRIC TUMS ULTRA Chew 2,000 mg by mouth at bedtime.   diclofenac Sodium 1 % Gel Commonly known as: VOLTAREN Apply 1 application topically 4 (four) times daily as needed (pain.).   dorzolamide 2 % ophthalmic solution Commonly known as: TRUSOPT Place 1 drop into the left eye 2 (two) times daily.   EPINEPHrine 0.3 mg/0.3 mL Soaj injection Commonly known as: EPI-PEN Inject 0.3 mg into the muscle as needed (for allergic reaction).   Fish Oil 1000 MG Caps Take 1,000 mg by mouth daily.   FLAXSEED OIL PO Take 1 tablet by mouth daily.   fluticasone 50 MCG/ACT nasal spray Commonly known as: FLONASE Place 1 spray into both nostrils daily as needed for allergies.   irbesartan 300 MG tablet Commonly known as: AVAPRO Take 300 mg by mouth daily.   Lumigan 0.01 % Soln Generic drug: bimatoprost Place 1 drop into the right eye at bedtime.   LUTEIN-ZEAXANTHIN PO Take 1 tablet by mouth daily.   Magnesium 250 MG Tabs Take 250 mg by mouth daily.   predniSONE 10 MG tablet Commonly known as: DELTASONE Take 3 tablets x 1 day, 2 tablets x 2 days, then 1 tablet x 2 days then stop   PRESERVISION AREDS 2 PO Take 1 tablet by mouth 2 (two) times daily.    SUPER B COMPLEX PO Take 1 tablet by mouth daily.   Systane 0.4-0.3 % Soln Generic drug: Polyethyl Glycol-Propyl Glycol Place 1 drop into both eyes 3 (three) times daily as needed (for tired/dry eyes.).   timolol 0.5 % ophthalmic solution Commonly known as: TIMOPTIC Place 1 drop into the left eye 2 (two) times daily.   TURMERIC PO Take 1,500 mg by mouth daily.   vitamin B-12 1000 MCG tablet Commonly known as: CYANOCOBALAMIN Take 1,000 mcg by mouth daily.   Vitamin C 500 MG Chew Chew 500 mg by mouth daily.   Vitamin D3 50 MCG (2000 UT) Tabs Take 2,000 Units by mouth at bedtime as needed (indigestion).   vitamin E 400 UNIT capsule Take 400 Units by mouth daily. (180 mg)   Zinc 30 MG  Tabs Take 30 mg by mouth daily.        Follow-up appointment   Wednesday 02/06/2019 at 1:30 pm with Eric Form, NP with CXR and CBC, CMET/ LFT's December 17th at 09:45 with Dr. Valeta Harms Discharge Condition:    good  Physician Statement:   The Patient was personally examined, the discharge assessment and plan has been personally reviewed and I agree with ACNP Xzaviar Maloof's assessment and plan. 45 minutes of time have been dedicated to discharge assessment, planning and discharge instructions.   Signed: Magdalen Spatz, MSN, AGACNP-BC [place] Pager # [phone] After 4 pm please call [phone] 02/01/2019 9:57 AM

## 2019-02-01 NOTE — Plan of Care (Signed)
Patient is on RA, ambulating in room with minimal assistance. Patient is ready for discharge.

## 2019-02-02 LAB — CULTURE, RESPIRATORY W GRAM STAIN
Culture: NORMAL
Culture: NORMAL

## 2019-02-04 ENCOUNTER — Other Ambulatory Visit: Payer: Self-pay

## 2019-02-04 DIAGNOSIS — R918 Other nonspecific abnormal finding of lung field: Secondary | ICD-10-CM

## 2019-02-05 ENCOUNTER — Inpatient Hospital Stay: Payer: PPO | Attending: Internal Medicine | Admitting: Internal Medicine

## 2019-02-05 ENCOUNTER — Encounter: Payer: Self-pay | Admitting: Internal Medicine

## 2019-02-05 ENCOUNTER — Other Ambulatory Visit: Payer: Self-pay

## 2019-02-05 ENCOUNTER — Telehealth: Payer: Self-pay | Admitting: Internal Medicine

## 2019-02-05 ENCOUNTER — Inpatient Hospital Stay: Payer: PPO

## 2019-02-05 VITALS — BP 147/62 | HR 105 | Temp 97.4°F | Resp 20 | Ht 65.5 in | Wt 228.6 lb

## 2019-02-05 DIAGNOSIS — R918 Other nonspecific abnormal finding of lung field: Secondary | ICD-10-CM

## 2019-02-05 DIAGNOSIS — Z8049 Family history of malignant neoplasm of other genital organs: Secondary | ICD-10-CM | POA: Insufficient documentation

## 2019-02-05 DIAGNOSIS — H538 Other visual disturbances: Secondary | ICD-10-CM | POA: Diagnosis not present

## 2019-02-05 DIAGNOSIS — R5383 Other fatigue: Secondary | ICD-10-CM | POA: Insufficient documentation

## 2019-02-05 DIAGNOSIS — Z87891 Personal history of nicotine dependence: Secondary | ICD-10-CM | POA: Insufficient documentation

## 2019-02-05 DIAGNOSIS — H3530 Unspecified macular degeneration: Secondary | ICD-10-CM | POA: Insufficient documentation

## 2019-02-05 DIAGNOSIS — R61 Generalized hyperhidrosis: Secondary | ICD-10-CM | POA: Insufficient documentation

## 2019-02-05 DIAGNOSIS — J9811 Atelectasis: Secondary | ICD-10-CM | POA: Diagnosis not present

## 2019-02-05 DIAGNOSIS — C3411 Malignant neoplasm of upper lobe, right bronchus or lung: Secondary | ICD-10-CM | POA: Insufficient documentation

## 2019-02-05 DIAGNOSIS — R590 Localized enlarged lymph nodes: Secondary | ICD-10-CM | POA: Insufficient documentation

## 2019-02-05 DIAGNOSIS — K219 Gastro-esophageal reflux disease without esophagitis: Secondary | ICD-10-CM | POA: Insufficient documentation

## 2019-02-05 DIAGNOSIS — R0600 Dyspnea, unspecified: Secondary | ICD-10-CM | POA: Diagnosis not present

## 2019-02-05 DIAGNOSIS — Z7189 Other specified counseling: Secondary | ICD-10-CM

## 2019-02-05 DIAGNOSIS — Z8249 Family history of ischemic heart disease and other diseases of the circulatory system: Secondary | ICD-10-CM | POA: Diagnosis not present

## 2019-02-05 DIAGNOSIS — Z85828 Personal history of other malignant neoplasm of skin: Secondary | ICD-10-CM | POA: Insufficient documentation

## 2019-02-05 DIAGNOSIS — C3490 Malignant neoplasm of unspecified part of unspecified bronchus or lung: Secondary | ICD-10-CM

## 2019-02-05 DIAGNOSIS — I10 Essential (primary) hypertension: Secondary | ICD-10-CM | POA: Diagnosis not present

## 2019-02-05 DIAGNOSIS — M1990 Unspecified osteoarthritis, unspecified site: Secondary | ICD-10-CM | POA: Diagnosis not present

## 2019-02-05 DIAGNOSIS — R0609 Other forms of dyspnea: Secondary | ICD-10-CM | POA: Insufficient documentation

## 2019-02-05 DIAGNOSIS — J449 Chronic obstructive pulmonary disease, unspecified: Secondary | ICD-10-CM | POA: Diagnosis not present

## 2019-02-05 DIAGNOSIS — Z8719 Personal history of other diseases of the digestive system: Secondary | ICD-10-CM | POA: Insufficient documentation

## 2019-02-05 DIAGNOSIS — Z8042 Family history of malignant neoplasm of prostate: Secondary | ICD-10-CM | POA: Insufficient documentation

## 2019-02-05 DIAGNOSIS — Z79899 Other long term (current) drug therapy: Secondary | ICD-10-CM | POA: Diagnosis not present

## 2019-02-05 DIAGNOSIS — Z803 Family history of malignant neoplasm of breast: Secondary | ICD-10-CM | POA: Diagnosis not present

## 2019-02-05 LAB — CMP (CANCER CENTER ONLY)
ALT: 80 U/L — ABNORMAL HIGH (ref 0–44)
AST: 53 U/L — ABNORMAL HIGH (ref 15–41)
Albumin: 4.3 g/dL (ref 3.5–5.0)
Alkaline Phosphatase: 120 U/L (ref 38–126)
Anion gap: 12 (ref 5–15)
BUN: 22 mg/dL (ref 8–23)
CO2: 24 mmol/L (ref 22–32)
Calcium: 10 mg/dL (ref 8.9–10.3)
Chloride: 104 mmol/L (ref 98–111)
Creatinine: 0.97 mg/dL (ref 0.44–1.00)
GFR, Est AFR Am: >60 mL/min (ref >60)
GFR, Estimated: 53 mL/min — ABNORMAL LOW (ref >60)
Glucose, Bld: 196 mg/dL — ABNORMAL HIGH (ref 70–99)
Potassium: 4.5 mmol/L (ref 3.5–5.1)
Sodium: 140 mmol/L (ref 135–145)
Total Bilirubin: 1 mg/dL (ref 0.3–1.2)
Total Protein: 8.1 g/dL (ref 6.5–8.1)

## 2019-02-05 LAB — CYTOLOGY - NON PAP

## 2019-02-05 LAB — CBC WITH DIFFERENTIAL (CANCER CENTER ONLY)
Abs Immature Granulocytes: 0.08 10*3/uL — ABNORMAL HIGH (ref 0.00–0.07)
Basophils Absolute: 0.1 10*3/uL (ref 0.0–0.1)
Basophils Relative: 1 %
Eosinophils Absolute: 0.5 10*3/uL (ref 0.0–0.5)
Eosinophils Relative: 4 %
HCT: 50.5 % — ABNORMAL HIGH (ref 36.0–46.0)
Hemoglobin: 16.5 g/dL — ABNORMAL HIGH (ref 12.0–15.0)
Immature Granulocytes: 1 %
Lymphocytes Relative: 10 %
Lymphs Abs: 1.3 10*3/uL (ref 0.7–4.0)
MCH: 29 pg (ref 26.0–34.0)
MCHC: 32.7 g/dL (ref 30.0–36.0)
MCV: 88.8 fL (ref 80.0–100.0)
Monocytes Absolute: 0.7 10*3/uL (ref 0.1–1.0)
Monocytes Relative: 5 %
Neutro Abs: 11.3 10*3/uL — ABNORMAL HIGH (ref 1.7–7.7)
Neutrophils Relative %: 79 %
Platelet Count: 457 10*3/uL — ABNORMAL HIGH (ref 150–400)
RBC: 5.69 MIL/uL — ABNORMAL HIGH (ref 3.87–5.11)
RDW: 13.5 % (ref 11.5–15.5)
WBC Count: 14 10*3/uL — ABNORMAL HIGH (ref 4.0–10.5)
nRBC: 0 % (ref 0.0–0.2)

## 2019-02-05 LAB — SURGICAL PATHOLOGY

## 2019-02-05 NOTE — Progress Notes (Signed)
[place] [phone]   [phone]  CONSULT NOTE  REFERRING PHYSICIAN: Dr. Leory Plowman Icard  REASON FOR CONSULTATION:  85 years old white female with likely lung cancer.  HPI Ashley Parker is a 85 y.o. female with past medical history significant for hypertension, GERD, COPD, basal cell carcinoma of the skin, osteoarthritis, macular degeneration, osteoporosis as well as distal esophageal stricture and degenerative disc disease.  She also has remote history for smoking but quit 40 years ago.  The patient is followed by Dr. Lamonte Sakai for her history of COPD and was noted on previous CT scan of the chest on Jul 04, 2017 to have development of 0.8 x 1.2 cm irregular soft tissue nodule within the right upper lobe groundglass nodule that was seen previously concerning for neoplasm.  There was also a posterior left upper lobe lesion that was also progressed in the interval.  The patient had a PET scan on August 02, 2017 and the new solid right upper lobe nodule was mildly hypermetabolic suspicious for adenocarcinoma.  The slowly enlarging solid component within the part solid left upper lobe lesion is also mildly hypermetabolic with similar intensity to previous PET scan in 2016.  These were suspicious for adenocarcinoma.  No evidence of metastatic disease outside the chest.  On August 23, 2017 the patient underwent bronchoscopy under the care of Dr. Lamonte Sakai and the final cytology was not conclusive for malignancy.  The patient was followed by observation and repeat CT scan of the chest on February 06, 2018 showed significant growth of the part solid right upper lobe nodule over the last 6 months with increasingly solid component highly worrisome for adenocarcinoma and repeat tissue sampling was recommended.  The part solid left upper lobe nodule had not significantly changed in the interval.  There was also a new mildly enlarged right paratracheal lymph node.  She was followed  closely with repeat imaging studies under the care of Dr. Lamonte Sakai and repeat CT scan of the chest on December 05, 2018 showed dramatic progression of disease with enlargement of the previously noted right upper lobe nodule which now is a large masslike area measuring 9.6 x 6.6 x 5.4 cm with surrounding changes concerning for developing lymphangitic spread of disease.  There was also associated progressive mediastinal lymphadenopathy.  The previously noted left upper lobe nodule is very similar to the prior study. On January 31, 2019 the patient underwent repeat bronchoscopy with endobronchial ultrasound and biopsies under the care of Dr. Valeta Harms.  The final pathology is still pending. The patient was referred to me today for evaluation and recommendation regarding treatment of her condition. When seen today she continues to complain of shortness of breath with exertion.  She also has intermittent sweating with cough productive of thick mucus but no significant chest pain or hemoptysis.  She had no significant weight loss.  She denied having any nausea, vomiting, diarrhea or constipation.  She denied having any headache but she has poor vision secondary to macular degeneration. Family history significant for father with metastatic prostate cancer, mother had breast cancer and uterine cancer. The patient is married and has 5 stepchildren.  She used to work as an Chiropodist.  She has a history for smoking 1 pack/day for around 32 years and quit at age 36.  She drinks alcohol occasionally and no history of drug abuse.   HPI  Past Medical History:  Diagnosis Date   Arthritis  Basal cell carcinoma (BCC) of skin of face 2016   removed in 2016   COPD (chronic obstructive pulmonary disease) (HCC)    Mild   Crohn's ileitis (HCC)    Mild, mostly asymptomatic, not on therapy   Fundic gland polyposis of stomach    GERD (gastroesophageal reflux disease)    Glaucoma     Hemorrhoids    Internal and External   History of hiatal hernia    Hypertension    Macular degeneration    Obesity, unspecified    Osteoporosis    Pelvic fracture ([place]) 2007   Stricture of esophagus    distal   Unspecified vitamin D deficiency     Past Surgical History:  Procedure Laterality Date   APPENDECTOMY     BACK SURGERY     CATARACT EXTRACTION     COLONOSCOPY  07/27/2001, 07/08/2011   Multiple   ESOPHAGEAL DILATION  2013   ESOPHAGOGASTRODUODENOSCOPY     multiple   LAPAROSCOPIC APPENDECTOMY N/A 02/19/2014   Procedure: APPENDECTOMY LAPAROSCOPIC;  Surgeon: Erroll Luna, MD;  Location: [facility];  Service: General;  Laterality: N/A;   MINOR PLACEMENT OF FIDUCIAL  08/23/2017   Procedure: MINOR PLACEMENT OF FIDUCIAL - Left Upper Lobe Lung x2, Right Upper Lobe Lung x3;  Surgeon: Collene Gobble, MD;  Location: [facility];  Service: Thoracic;;   TONSILLECTOMY AND ADENOIDECTOMY     and adenoids    TOTAL KNEE ARTHROPLASTY Left 2007   VIDEO BRONCHOSCOPY WITH ENDOBRONCHIAL NAVIGATION N/A 08/23/2017   Procedure: VIDEO BRONCHOSCOPY WITH ENDOBRONCHIAL NAVIGATION;  Surgeon: Collene Gobble, MD;  Location: [facility];  Service: Thoracic;  Laterality: N/A;   VIDEO BRONCHOSCOPY WITH ENDOBRONCHIAL NAVIGATION N/A 01/31/2019   Procedure: VIDEO BRONCHOSCOPY WITH ENDOBRONCHIAL NAVIGATION;  Surgeon: Garner Nash, DO;  Location: [facility];  Service: Thoracic;  Laterality: N/A;   VIDEO BRONCHOSCOPY WITH ENDOBRONCHIAL ULTRASOUND N/A 01/31/2019   Procedure: VIDEO BRONCHOSCOPY WITH ENDOBRONCHIAL ULTRASOUND;  Surgeon: Garner Nash, DO;  Location: [facility];  Service: Thoracic;  Laterality: N/A;   WRIST FRACTURE SURGERY Left 2018   with screws    Family History  Problem Relation Age of Onset   Prostate cancer Father    Breast cancer Mother    Heart disease Mother    Uterine cancer Mother    Colon cancer Neg Hx     Social History Social History   Tobacco Use   Smoking status:  Former Smoker    Packs/day: 1.00    Years: 32.00    Pack years: 32.00    Types: Cigarettes    Quit date: 02/18/1981    Years since quitting: 37.9   Smokeless tobacco: Never Used  Substance Use Topics   Alcohol use: Yes    Alcohol/week: 0.0 standard drinks    Comment: 1 drink daily    Drug use: No    Allergies  Allergen Reactions   Wasp Venom Anaphylaxis and Other (See Comments)    Severe pain in lower stomach, sweatiness, and BP drops low    Biaxin [Clarithromycin] Other (See Comments)    MYALGIAS ACHES   Brimonidine Dermatitis and Other (See Comments)    Redness/Peeling of skin    Current Outpatient Medications  Medication Sig Dispense Refill   Ascorbic Acid (VITAMIN C) 500 MG CHEW Chew 500 mg by mouth daily.     B Complex-C (SUPER B COMPLEX PO) Take 1 tablet by mouth daily.     Calcium Carb-Cholecalciferol (CALCIUM 600+D3 PO) Take 1-2 tablets by  mouth See admin instructions. Take 1 tablet in the morning & 2 tablet at night     calcium elemental as carbonate (BARIATRIC TUMS ULTRA) 400 MG chewable tablet Chew 2,000 mg by mouth at bedtime.     Cholecalciferol (VITAMIN D3) 2000 units TABS Take 2,000 Units by mouth at bedtime as needed (indigestion).      diclofenac Sodium (VOLTAREN) 1 % GEL Apply 1 application topically 4 (four) times daily as needed (pain.).      dorzolamide (TRUSOPT) 2 % ophthalmic solution Place 1 drop into the left eye 2 (two) times daily.      EPINEPHrine 0.3 mg/0.3 mL IJ SOAJ injection Inject 0.3 mg into the muscle as needed (for allergic reaction).     Flaxseed, Linseed, (FLAXSEED OIL PO) Take 1 tablet by mouth daily.     fluticasone (FLONASE) 50 MCG/ACT nasal spray Place 1 spray into both nostrils daily as needed for allergies.      irbesartan (AVAPRO) 300 MG tablet Take 300 mg by mouth daily.      LUMIGAN 0.01 % SOLN Place 1 drop into the right eye at bedtime.   4   Magnesium 250 MG TABS Take 250 mg by mouth daily.     Multiple  Vitamins-Minerals (LUTEIN-ZEAXANTHIN PO) Take 1 tablet by mouth daily.     Multiple Vitamins-Minerals (PRESERVISION AREDS 2 PO) Take 1 tablet by mouth 2 (two) times daily.     Omega-3 Fatty Acids (FISH OIL) 1000 MG CAPS Take 1,000 mg by mouth daily.     Polyethyl Glycol-Propyl Glycol (SYSTANE) 0.4-0.3 % SOLN Place 1 drop into both eyes 3 (three) times daily as needed (for tired/dry eyes.).      predniSONE (DELTASONE) 10 MG tablet Take 3 tablets x 1 day, 2 tablets x 2 days, then 1 tablet x 2 days then stop 7 tablet 0   timolol (TIMOPTIC) 0.5 % ophthalmic solution Place 1 drop into the left eye 2 (two) times daily.     TURMERIC PO Take 1,500 mg by mouth daily.     vitamin B-12 (CYANOCOBALAMIN) 1000 MCG tablet Take 1,000 mcg by mouth daily.     vitamin E 400 UNIT capsule Take 400 Units by mouth daily. (180 mg)     Zinc 30 MG TABS Take 30 mg by mouth daily.     No current facility-administered medications for this visit.     Review of Systems  Constitutional: positive for fatigue and night sweats Eyes: negative Ears, nose, mouth, throat, and face: negative Respiratory: positive for cough and dyspnea on exertion Cardiovascular: negative Gastrointestinal: negative Genitourinary:negative Integument/breast: negative Hematologic/lymphatic: negative Musculoskeletal:negative Neurological: negative Behavioral/Psych: negative Endocrine: negative Allergic/Immunologic: negative  Physical Exam  [id number], healthy, no distress, well nourished and well developed SKIN: skin color, texture, turgor are normal, no rashes or significant lesions HEAD: Normocephalic, No masses, lesions, tenderness or abnormalities EYES: normal, PERRLA, Conjunctiva are pink and non-injected EARS: External ears normal, Canals clear OROPHARYNX:no exudate, no erythema and lips, buccal mucosa, and tongue normal  NECK: supple, no adenopathy, no JVD LYMPH:  no palpable lymphadenopathy, no  hepatosplenomegaly BREAST:not examined LUNGS: coarse sounds heard, decreased breath sounds HEART: regular rate & rhythm, no murmurs and no gallops ABDOMEN:abdomen soft, non-tender, normal bowel sounds and no masses or organomegaly BACK: Back symmetric, no curvature., No CVA tenderness EXTREMITIES:no joint deformities, effusion, or inflammation, no edema  NEURO: alert & oriented x 3 with fluent speech, no focal motor/sensory deficits  PERFORMANCE STATUS: ECOG 1  LABORATORY DATA: Lab  Results  Component Value Date   WBC 14.0 (H) 02/05/2019   HGB 16.5 (H) 02/05/2019   HCT 50.5 (H) 02/05/2019   MCV 88.8 02/05/2019   PLT 457 (H) 02/05/2019      Chemistry      Component Value Date/Time   NA 137 02/01/2019 0237   K 4.6 02/01/2019 0237   CL 104 02/01/2019 0237   CO2 22 02/01/2019 0237   BUN 14 02/01/2019 0237   CREATININE 0.81 02/01/2019 0237      Component Value Date/Time   CALCIUM 9.0 02/01/2019 0237   ALKPHOS 98 01/30/2019 1354   AST 41 01/30/2019 1354   ALT 55 (H) 01/30/2019 1354   BILITOT 0.8 01/30/2019 1354       RADIOGRAPHIC STUDIES: Dg Chest Port 1 View  Result Date: 02/01/2019 CLINICAL DATA:  Lung cancer. EXAM: PORTABLE CHEST 1 VIEW COMPARISON:  01/31/2019 FINDINGS: Stable right upper lobe consolidation/mass with to fiducials in place. Small nodular lesion in the left upper lobe with 2 fiducials in place. No acute overlying pulmonary process is identified. No pneumothorax. No pleural effusions. Moderate-sized hiatal hernia. IMPRESSION: 1. Stable right upper lobe consolidation and fiducials. 2. Small left upper lobe nodule with 2 fiducials. 3. No acute overlying pulmonary process or pneumothorax. Electronically Signed   By: Marijo Sanes [name]   On: 02/01/2019 07:02   Dg Chest Port 1 View  Result Date: 01/31/2019 CLINICAL DATA:  Left pneumothorax. EXAM: PORTABLE CHEST 1 VIEW COMPARISON:  Same day. FINDINGS: The heart size and mediastinal contours are within normal  limits. No pneumothorax is noted. Stable right upper lobe opacity is noted consistent with mass and associated atelectasis. Stable left upper lobe mass is noted. The visualized skeletal structures are unremarkable. IMPRESSION: No pneumothorax is noted currently. Stable right upper lobe opacity is noted consistent with mass and associated atelectasis. Stable left upper lobe mass is noted. Electronically Signed   By: Marijo Conception [name]   On: 01/31/2019 19:42   Dg Chest Port 1 View  Result Date: 01/31/2019 CLINICAL DATA:  Post bronchoscopy, history COPD, hypertension EXAM: PORTABLE CHEST 1 VIEW COMPARISON:  Portable exam 1703 hours compared to 08/23/2017 and correlated with CT chest of 12/05/2018 FINDINGS: Normal heart size mediastinal contours. Surgical clips/markers are seen within the upper lobes bilaterally. Progressive enlargement of RIGHT upper lobe mass since 2019. Additional small nodular focus is seen within the LEFT upper lobe corresponding to a previously identified smaller mass. Rotated to the LEFT. Mild central peribronchial thickening. Subsegmental atelectasis at RIGHT base. A thin curvilinear line identified at LEFT apex suspicious for a small LEFT apical pneumothorax. No pleural effusion, definite pneumothorax or definite acute infiltrate. Bones demineralized. IMPRESSION: Interval increase in size of RIGHT upper lobe mass since 2019 with relatively stable appearance of previously identified LEFT upper lobe mass. Bronchitic changes with subsegmental atelectasis at RIGHT base. Small probable LEFT apex pneumothorax. Findings called to [facility] in PACU on 01/31/2019 at 1720 hours. Electronically Signed   By: Lavonia Dana [name]   On: 01/31/2019 17:14   Dg C-arm Bronchoscopy  Result Date: 01/31/2019 C-ARM BRONCHOSCOPY: Fluoroscopy was utilized by the requesting physician.  No radiographic interpretation.    ASSESSMENT: This is a very pleasant 85 years old white female with highly suspicious stage  IIIc/IV (T4, N2, M0//M1a) lung cancer likely non-small cell carcinoma pending tissue diagnosis and presented with large right upper lobe lung mass in addition to mediastinal lymphadenopathy and suspicious left upper lobe nodule.   PLAN:  I had a lengthy discussion with the patient today about her current disease status and further investigation to confirm her diagnosis as well as the staging work-up. I will wait for the final pathology for confirmation of her diagnosis as well as the subtype of the lung cancer. I recommended for the patient to have MRI of the brain to rule out brain metastasis. I will refer the patient to radiation oncology for consideration of radiotherapy to the right upper lobe lung mass and mediastinum as part of a course of concurrent chemoradiation if the patient has no other evidence of metastatic disease in her final diagnosis is consistent with non-small cell carcinoma.  This could be also just palliative in nature if the patient has metastatic disease. If the final pathology is consistent with adenocarcinoma, I will send her tissue biopsy for molecular studies by foundation 1 as well as PD-L1 expression. I will arrange for the patient to come back for follow-up visit in 1 week for reevaluation and more detailed discussion of her treatment options based on the final pathology. The patient was advised to call immediately if she has any concerning symptoms in the interval. The patient voices understanding of current disease status and treatment options and is in agreement with the current care plan.  All questions were answered. The patient knows to call the clinic with any problems, questions or concerns. We can certainly see the patient much sooner if necessary.  Thank you so much for allowing me to participate in the care of [place]. I will continue to follow up the patient with you and assist in her care.  I spent 55 minutes counseling the patient face to face. The  total time spent in the appointment was 80 minutes.  Disclaimer: This note was dictated with voice recognition software. Similar sounding words can inadvertently be transcribed and may not be corrected upon review.   Eilleen Kempf February 05, 2019, 1:49 PM

## 2019-02-05 NOTE — Telephone Encounter (Signed)
Scheduled appt per 12/08 los - gave patient AVS and calender per los.

## 2019-02-06 ENCOUNTER — Telehealth (INDEPENDENT_AMBULATORY_CARE_PROVIDER_SITE_OTHER): Payer: PPO | Admitting: Acute Care

## 2019-02-06 ENCOUNTER — Encounter: Payer: PPO | Admitting: Acute Care

## 2019-02-06 DIAGNOSIS — D72829 Elevated white blood cell count, unspecified: Secondary | ICD-10-CM

## 2019-02-06 DIAGNOSIS — C3490 Malignant neoplasm of unspecified part of unspecified bronchus or lung: Secondary | ICD-10-CM | POA: Diagnosis not present

## 2019-02-06 DIAGNOSIS — R7989 Other specified abnormal findings of blood chemistry: Secondary | ICD-10-CM | POA: Diagnosis not present

## 2019-02-06 DIAGNOSIS — J95811 Postprocedural pneumothorax: Secondary | ICD-10-CM

## 2019-02-06 NOTE — Progress Notes (Signed)
Virtual Visit via Telephone Note  I connected with Ashley Parker on 02/06/19 at  1:30 PM EST by telephone and verified that I am speaking with the correct person using two identifiers.  Location: Patient: At home Provider: At the office at [place], [place] [address]   I discussed the limitations, risks, security and privacy concerns of performing an evaluation and management service by telephone and the availability of in person appointments. I also discussed with the patient that there may be a patient responsible charge related to this service. The patient expressed understanding and agreed to proceed.   History of Present Illness: Pt. Presents for hospital follow up after bronchoscopy by Dr. Valeta Harms 01/31/2019. She had the bronchoscopy for tissue diagnosis of a newly enlarged right sided lung mass with mediastinal and hilar adenopathy. Post procedure chest x ray revealed  the patient had developed a small probable left apical pneumothorax. She was admitted to a step down bed for monitoring overnight..Pt. was treated with supplemental oxygen overnight. The Small apical pneumothorax showed resolution on CXR 12/4 . She did not require chest tube placement . She was discharged home with her daughter on 12/4. She was stable, without complaints of dyspnea and ready to go home. She was sent home on 5 day prednisone taper for cough.   Pt. Presents today by phone for follow up. She states she has been    Observations/Objective: Chest x-ray 12/3 > small probable left apical pneumothorax , interval increase in size of RIGHT upper lobe mass since 2019 with relatively stable appearance of previously identified LEFT upper lobe mass.  CXR 02/01/2019 Stable right upper lobe consolidation and fiducials. Small left upper lobe nodule with 2 fiducials. No acute overlying pulmonary process or pneumothorax.  Surgical Pathology collected 12/3 By immunohistochemistry the neoplastic cells  are positive for TTF-1  but negative for cytokeratin 5/6 and p40. Overall, the features are  consistent with a poorly differentiated adenocarcinoma. Dr. Jeannie Done  reviewed the case and agrees with the above diagnosis. Dr. Earlie Server was  notified of these results on February 05, 2019. PD-L1  immunohistochemistry is pending and will be reported in an addendum.   Procedures   12/3 Bronchoscopy Culture data/antimicrobials   12/3 BAL Respiratory Culture >> Pending 12/3 BAL AFB>> Pending 12/3 AFB Fungus>> Pending 12/2  SARS Coronavirus 2 NEGATIVE NEGATIVE    Consults  None   Assessment and Plan: Adenocarcinoma of the lung Presented with large right upper lobe lung mass in addition to mediastinal lymphadenopathy and suspicious left upper lobe nodule. Highly suspicious stage IIIc/IV (T4, N2, M0//M1a) lung cancer >> adeno per pathology Tissue biopsy obtained 01/31/2019  sent for molecular studies by foundation 1 as well as PD-L1 expression. Plan Follow pending molecular studies MRI brain to RO [facility] Radiation oncology referral made by Dr. Earlie Server Follow up appointment with Dr. Earlie Server 02/14/2019 Follow up appointment with Dr. Lamonte Sakai 02/26/2019 Consultation with Dr. Sondra Come,  02/27/2019   COPD Plan Continue Arnuity daily as you have been doing Rinse mouth after use Work on quitting smoking Note your daily symptoms > remember "red flags" for COPD:  Increase in cough, increase in sputum production, increase in shortness of breath or activity intolerance. If you notice these symptoms, please call to be seen.     Pneumothorax Plan CXR OOB to chair CXR Aggressive Pulmonary Toilet  Avoid straining    Leukocytosis Plan Follow up CBC with Diff  Elevated LFT's Plan Follow up LFT    Follow  Up Instructions: CBC. CMET with LFT's, CXR Follow up appointment with Dr. Earlie Server 02/14/2019 Follow up appointment with Dr. Lamonte Sakai 02/26/2019 Consultation with Dr. Sondra Come,   02/27/2019   I discussed the assessment and treatment plan with the patient. The patient was provided an opportunity to ask questions and all were answered. The patient agreed with the plan and demonstrated an understanding of the instructions.   The patient was advised to call back or seek an in-person evaluation if the symptoms worsen or if the condition fails to improve as anticipated.  I provided  minutes of non-face-to-face time during this encounter.   Magdalen Spatz, MSN, AGACNP-BC [place] Pager # [phone] After 4 pm please call [phone] 02/06/2019 1:21 PM

## 2019-02-06 NOTE — Progress Notes (Signed)
Virtual Visit via Telephone Note  I connected with Ashley Parker on 02/06/19 at  1:30 PM EST by telephone and verified that I am speaking with the correct person using two identifiers.  Location: Patient: At home Provider: At the office at [address] [place], [place], [address]   I discussed the limitations, risks, security and privacy concerns of performing an evaluation and management service by telephone and the availability of in person appointments. I also discussed with the patient that there may be a patient responsible charge related to this service. The patient expressed understanding and agreed to proceed.  Hospital Admission 12/3-12/4 Ashley Parker is an 85 year old female with a past medical history significant for tobacco abuse who presented for outpatient bronchoscopy 01/31/2019  due to enlarged right sided lung mass involving the mediastinum hilum and mediastinal adenopathy.  No obvious complications during procedure, postprocedure chest x-ray with evidence of small probable left apical pneumothorax.  Dr. Valeta Harms made aware of chest x-ray results with decision to admit patient under observation for monitoring overnight and application of supplemental oxygen.Pt. was treated with supplemental oxygen overnight. Small apical pneumothorax shows resolution on CXR 12/4. Pt denies any dyspnea. Few blood tinged secretions noted overnight, which patient states are resolving. She does have some wheezing on exam . She has reached maximal inpatient benefit. She will be discharged today with a prednisone taper and follow up in the office next week with CXR ( February 06, 2019 at 1:30 pm.). Additionally she has follow up with Dr. Lamonte Sakai  12/29 /2020 at    History of Present Illness: Pt. Presents for hospital  follow up after hospital admission for  a small apical pneumothorax after biopsy of lung mass On 01/31/2019.Pneumothorax resolved overnight with supplemental oxygen in the form of 100% NRB.She  was discharged home the next day with her daughter.   This visit had originally been scheduled as an OV but the patient called the office staff and changed it to a tele visit as she was moving into a new home and could not come to the office. We tried to convert it to an video visit so that I could visually assess the patient, but the patient was having technical issues, and was unable to make the connection.  I explained that we needed to get a CXR and some labs done, which is why it had been scheduled as an OV.We will make arrangements for her to get labs and CXR when she goes to her oncology appointment with Dr. Earlie Server. The change from OV to Tele visit also made it necessary that I give the patient biopsy results by phone, which I prefer to do face to face to provide support.  I explained that her biopsy was positive for adenocarcinoma. She verbalized understanding. Stated her husband had died of adenocarcinoma of the lung. I listened to her and comforted her as I was able by phone,.  She had already been to see Dr. Earlie Server , but this was prior to her tissue diagnosis. She has follow up scheduled with him 12/17 to review treatment plan now that we have a diagnosis. Additionally she has follow up scheduled with Dr. Sondra Come and his nurse navigator 02/27/2019 to initiate SBRT treatment, as she is not a surgical candidate. .   Per the patient she has been doing well since her discharge home. She has been compliant with her Arnuity daily since returning home . She has one day left of her prednisone taper. She denies any  fever, chest pain, orthopnea of hemoptysis. No shortness of breath. We discussed the need to seek emergency care for shortness of breath,of hemoptysis . She verbalized understanding.   Observations/Objective: 02/01/2019 CXR Stable right upper lobe consolidation and fiducials. Small left upper lobe nodule with 2 fiducials. No acute overlying pulmonary process or pneumothorax.  01/31/2019  CXR Interval increase in size of RIGHT upper lobe mass since 2019 with relatively stable appearance of previously identified LEFT upper lobe mass. Bronchitic changes with subsegmental atelectasis at RIGHT base. Small probable LEFT apex pneumothorax.  05/10/2018 CT Super D Chest W/O Contrast No substantial change in the posterior left upper lobe part solid pulmonary nodule with a solid component measuring 1.1 x 1.0 cm today compared to 1.3 x 1.0 cm previously. Imaging features remain concerning for primary bronchogenic neoplasm. 2. Posterior right upper lobe pulmonary nodule also similar to prior measuring 1.9 x 1.6 cm today compared to 1.9 x 1.7 cm previously. 3. Mediastinal lymphadenopathy is progressed in the interval, raising concern for metastatic disease. 4. Stable 13 mm left adrenal nodule compatible with adenoma or myelolipoma. 5.  Aortic Atherosclerois (ICD10-170.0) 6.  Emphysema. ([id number])  Assessment and Plan:  Adenocarcinoma of the lung Explained to  patient her biopsy results returned as adenocarcinoma Plan Follow up with Dr. Earlie Server 12/17>> Please get labs and CXR while there.  Orders have been placed Follow up with Dr. Sondra Come 12/30 to initiate SBRT  MRI Brain has been ordered for 02/13/2019  Post bronchoscopy pneumothorax 12/3 Resolved with supplemental oxygen overnight Discharged home 12/4 Pt  Converted her OV to tele visit, and CXR and labs were not able to be obtained as planned Plan CXR and labs 12/17 when you go to see Dr. Earlie Server We will call you with results Seek emergency care for any shortness of breath or blood in your sputum that does not stop or exceeds 1/2 to 1 cup. Follow up with Dr. Lamonte Sakai as scheduled 02/26/2019  Leukocytosis Plan Follow up CBC 12/17  Elevated LFT's Plan Follow up CMET and LFT's 12/17  Follow Up Instructions: Follow up With Dr. Earlie Server 12/17 as scheduled Please make sure you get CXR and labs at this office  visit Follow up 02/26/2019 with Dr. Lamonte Sakai as scheduled Consultation with Dr. Sondra Come and his Nurse Navigator 02/27/2019 >> SBRT   I discussed the assessment and treatment plan with the patient. The patient was provided an opportunity to ask questions and all were answered. The patient agreed with the plan and demonstrated an understanding of the instructions.   The patient was advised to call back or seek an in-person evaluation if the symptoms worsen or if the condition fails to improve as anticipated.  I provided 45 minutes  minutes of non-face-to-face time during this encounter.   Magdalen Spatz, NP 02/07/2019 11:33 AM

## 2019-02-07 ENCOUNTER — Encounter: Payer: Self-pay | Admitting: Acute Care

## 2019-02-07 NOTE — Patient Instructions (Signed)
It was good to talk with you today. I am so glad you are doing well. Continue taking your Arnuity daily as you are doing Finish  the prednisone taper you prescribed at discharge Seek emergency care for any shortness of breath or blood in your sputum that does not stop or exceeds 1/2 to 1 cup.  We discussed that your biopsy result was confirmed as adenocarcinoma of the lung.  Follow up With Dr. Earlie Server 12/17 as scheduled Please make sure you get CXR and labs at this office visit Follow up 02/26/2019 with Dr. Lamonte Sakai as scheduled Consultation with Dr. Sondra Come and his Nurse Navigator 02/27/2019  As scheduled Please contact office for sooner follow up if symptoms do not improve or worsen or seek emergency care  Have a Happy Holiday

## 2019-02-09 NOTE — Progress Notes (Signed)
PCCM: Thanks for speaking with her. Glad she is doing ok.  Garner Nash, DO [facility] 02/09/2019 2:51 PM

## 2019-02-11 ENCOUNTER — Encounter: Payer: Self-pay | Admitting: *Deleted

## 2019-02-11 NOTE — Progress Notes (Signed)
Oncology Nurse Navigator Documentation  Oncology Nurse Navigator Flowsheets 02/11/2019  Abnormal Finding Date 05/10/2018  Confirmed Diagnosis Date 01/31/2019  Navigator Follow Up Date: 02/13/2019  Navigator Follow Up Reason: Pathology/I followed up on Ashley Parker's treatment plan.  Dr. Julien Nordmann would like foundation one testing to be completed on tissue.  I followed up on the patient portal and do not see her tissue has been sent.  I followed up with the pathology dept to have them send tissue.  Navigator Location [facility]  Navigator Encounter Type Other:  Barriers/Navigation Needs Coordination of Care  Interventions Coordination of Care  Acuity Level 2-Minimal Needs (1-2 Barriers Identified)  Coordination of Care Other  Time Spent with Patient 30

## 2019-02-12 ENCOUNTER — Encounter: Payer: Self-pay | Admitting: *Deleted

## 2019-02-12 DIAGNOSIS — R918 Other nonspecific abnormal finding of lung field: Secondary | ICD-10-CM

## 2019-02-12 NOTE — Progress Notes (Signed)
Oncology Nurse Navigator Documentation  Oncology Nurse Navigator Flowsheets 02/12/2019  Abnormal Finding Date -  Confirmed Diagnosis Date -  Navigator Follow Up Date: -  Navigator Follow Up Reason: -  Navigator Location [facility]  Navigator Encounter Type Molecular Studies;Pathology Review;Other:  Barriers/Navigation Needs Coordination of Care/I received an email from pathology dept that there is not enough tissue for molecular testing.  I updated Dr. Julien Nordmann and he would like patient to get [facility].  Lab ordered.   Interventions Coordination of Care  Acuity Level 2-Minimal Needs (1-2 Barriers Identified)  Coordination of Care -  Time Spent with Patient 30

## 2019-02-13 ENCOUNTER — Ambulatory Visit (HOSPITAL_COMMUNITY)
Admission: RE | Admit: 2019-02-13 | Discharge: 2019-02-13 | Disposition: A | Discharge status: Home / Self Care | Payer: PPO | Source: Ambulatory Visit | Attending: Internal Medicine | Admitting: Internal Medicine

## 2019-02-13 ENCOUNTER — Other Ambulatory Visit: Payer: Self-pay

## 2019-02-13 ENCOUNTER — Telehealth: Payer: Self-pay | Admitting: Internal Medicine

## 2019-02-13 DIAGNOSIS — C3490 Malignant neoplasm of unspecified part of unspecified bronchus or lung: Secondary | ICD-10-CM | POA: Insufficient documentation

## 2019-02-13 MED ORDER — GADOBUTROL 1 MMOL/ML IV SOLN
10.0000 mL | Freq: Once | INTRAVENOUS | Status: AC | PRN
  Administered 2019-02-13: 10 mL via INTRAVENOUS

## 2019-02-13 NOTE — Telephone Encounter (Signed)
Called pt per 12/15 sch message - no answer and no vmail .

## 2019-02-14 ENCOUNTER — Other Ambulatory Visit: Payer: Self-pay

## 2019-02-14 ENCOUNTER — Inpatient Hospital Stay: Payer: PPO

## 2019-02-14 ENCOUNTER — Inpatient Hospital Stay (HOSPITAL_BASED_OUTPATIENT_CLINIC_OR_DEPARTMENT_OTHER): Payer: PPO | Admitting: Internal Medicine

## 2019-02-14 ENCOUNTER — Encounter: Payer: Self-pay | Admitting: Internal Medicine

## 2019-02-14 ENCOUNTER — Ambulatory Visit: Payer: PPO | Admitting: Pulmonary Disease

## 2019-02-14 VITALS — BP 161/64 | HR 78 | Temp 97.8°F | Resp 19 | Ht 65.5 in | Wt 229.3 lb

## 2019-02-14 DIAGNOSIS — Z5111 Encounter for antineoplastic chemotherapy: Secondary | ICD-10-CM | POA: Insufficient documentation

## 2019-02-14 DIAGNOSIS — R918 Other nonspecific abnormal finding of lung field: Secondary | ICD-10-CM

## 2019-02-14 DIAGNOSIS — I10 Essential (primary) hypertension: Secondary | ICD-10-CM | POA: Diagnosis not present

## 2019-02-14 DIAGNOSIS — Z7189 Other specified counseling: Secondary | ICD-10-CM

## 2019-02-14 DIAGNOSIS — C3491 Malignant neoplasm of unspecified part of right bronchus or lung: Secondary | ICD-10-CM | POA: Insufficient documentation

## 2019-02-14 DIAGNOSIS — C3411 Malignant neoplasm of upper lobe, right bronchus or lung: Secondary | ICD-10-CM | POA: Diagnosis not present

## 2019-02-14 MED ORDER — PROCHLORPERAZINE MALEATE 10 MG PO TABS: 10.0000 mg | ORAL_TABLET | Freq: Four times a day (QID) | ORAL | 0 refills | Status: DC | PRN

## 2019-02-14 NOTE — Progress Notes (Signed)
START ON PATHWAY REGIMEN - Non-Small Cell Lung     Administer weekly:     Paclitaxel      Carboplatin   **Always confirm dose/schedule in your pharmacy ordering system**  Patient Characteristics: Stage III - Unresectable, PS = 0, 1 AJCC T Category: T4 Current Disease Status: No Distant Mets or Local Recurrence AJCC N Category: N2 AJCC M Category: M0 AJCC 8 Stage Grouping: IIIB ECOG Performance Status: 1 Intent of Therapy: Curative Intent, Discussed with Patient

## 2019-02-14 NOTE — Progress Notes (Signed)
[facility] [phone]   [phone]  OFFICE PROGRESS NOTE  Prince Solian, MD [place] [address]  DIAGNOSIS: stage IIIc/IV (T4, N2, M0//M1a) lNon-small cell carcinoma, poorly differentiated adenocarcinoma,  presented with large right upper lobe lung mass in addition to mediastinal lymphadenopathy and suspicious left upper lobe nodule diagnosed in December 2020.  PRIOR THERAPY: None.  CURRENT THERAPY: Concurrent chemoradiation with weekly carboplatin for AUC of 2 and paclitaxel 45 mg/M2.  First dose of March 04, 2019  INTERVAL HISTORY: Ashley Parker 85 y.o. female returns to the clinic today for follow-up visit.  The patient is feeling fine today with no concerning complaints except for fatigue.  She is under a lot of stress moving to a different residency.  The patient denied having any current chest pain, shortness of breath, cough or hemoptysis.  She denied having any fever or chills.  She has no nausea, vomiting, diarrhea or constipation.  She denied having any headache or visual changes.  She had MRI of the brain performed recently that showed no evidence of metastatic disease to the brain.  Her final pathology was consistent with poorly differentiated adenocarcinoma but there was insufficient material for molecular studies.  The patient is here today for evaluation and discussion of her treatment options.  MEDICAL HISTORY: Past Medical History:  Diagnosis Date  . Arthritis   . Basal cell carcinoma (BCC) of skin of face 2016   removed in 2016  . COPD (chronic obstructive pulmonary disease) (HCC)    Mild  . Crohn's ileitis ([place])    Mild, mostly asymptomatic, not on therapy  . Fundic gland polyposis of stomach   . GERD (gastroesophageal reflux disease)   . Glaucoma   . Hemorrhoids    Internal and External  . History of hiatal hernia   . Hypertension   . Macular degeneration   . Obesity, unspecified   .  Osteoporosis   . Pelvic fracture ([place]) 2007  . Stricture of esophagus    distal  . Unspecified vitamin D deficiency     ALLERGIES:  is allergic to wasp venom; biaxin [clarithromycin]; and brimonidine.  MEDICATIONS:  Current Outpatient Medications  Medication Sig Dispense Refill  . albuterol (VENTOLIN HFA) 108 (90 Base) MCG/ACT inhaler Inhale 1 puff into the lungs as needed for wheezing or shortness of breath.    . Ascorbic Acid (VITAMIN C) 500 MG CHEW Chew 500 mg by mouth daily.    . B Complex-C (SUPER B COMPLEX PO) Take 1 tablet by mouth daily.    . Calcium Carb-Cholecalciferol (CALCIUM 600+D3 PO) Take 1-2 tablets by mouth See admin instructions. Take 1 tablet in the morning & 2 tablet at night    . calcium elemental as carbonate (BARIATRIC TUMS ULTRA) 400 MG chewable tablet Chew 2,000 mg by mouth at bedtime.    . Cholecalciferol (VITAMIN D3) 2000 units TABS Take 2,000 Units by mouth daily.     . diclofenac Sodium (VOLTAREN) 1 % GEL Apply 1 application topically 4 (four) times daily as needed (pain.).     [name] dorzolamide (TRUSOPT) 2 % ophthalmic solution Place 1 drop into the left eye 2 (two) times daily.     . Flaxseed, Linseed, (FLAXSEED OIL PO) Take 1 tablet by mouth daily.    . fluticasone (FLONASE) 50 MCG/ACT nasal spray Place 1 spray into both nostrils daily.     . Fluticasone Furoate (ARNUITY ELLIPTA) 100 MCG/ACT AEPB Inhale 1 puff into  the lungs daily. Arnuity chamber    . irbesartan (AVAPRO) 300 MG tablet Take 300 mg by mouth daily.     [name] LUMIGAN 0.01 % SOLN Place 1 drop into the right eye at bedtime.   4  . Magnesium 250 MG TABS Take 250 mg by mouth daily.    . Multiple Vitamins-Minerals (PRESERVISION AREDS 2 PO) Take 1 tablet by mouth 2 (two) times daily.    . Omega-3 Fatty Acids (FISH OIL) 1000 MG CAPS Take 1,000 mg by mouth daily.    Vladimir Faster Glycol-Propyl Glycol (SYSTANE) 0.4-0.3 % SOLN Place 1 drop into both eyes 3 (three) times daily as needed (for tired/dry eyes.).       [name] timolol (TIMOPTIC) 0.5 % ophthalmic solution Place 1 drop into the left eye 2 (two) times daily.    . TURMERIC PO Take 1,500 mg by mouth daily.    . vitamin B-12 (CYANOCOBALAMIN) 1000 MCG tablet Take 1,000 mcg by mouth daily.    . vitamin E 400 UNIT capsule Take 400 Units by mouth daily. (180 mg)    . Zinc 30 MG TABS Take 30 mg by mouth daily.    [name] EPINEPHrine 0.3 mg/0.3 mL IJ SOAJ injection Inject 0.3 mg into the muscle as needed (for allergic reaction).     No current facility-administered medications for this visit.    SURGICAL HISTORY:  Past Surgical History:  Procedure Laterality Date  . APPENDECTOMY    . BACK SURGERY    . CATARACT EXTRACTION    . COLONOSCOPY  07/27/2001, 07/08/2011   Multiple  . ESOPHAGEAL DILATION  2013  . ESOPHAGOGASTRODUODENOSCOPY     multiple  . LAPAROSCOPIC APPENDECTOMY N/A 02/19/2014   Procedure: APPENDECTOMY LAPAROSCOPIC;  Surgeon: Erroll Luna, MD;  Location: [facility];  Service: General;  Laterality: N/A;  . MINOR PLACEMENT OF FIDUCIAL  08/23/2017   Procedure: MINOR PLACEMENT OF FIDUCIAL - Left Upper Lobe Lung x2, Right Upper Lobe Lung x3;  Surgeon: Collene Gobble, MD;  Location: [facility];  Service: Thoracic;;  . TONSILLECTOMY AND ADENOIDECTOMY     and adenoids   . TOTAL KNEE ARTHROPLASTY Left 2007  . VIDEO BRONCHOSCOPY WITH ENDOBRONCHIAL NAVIGATION N/A 08/23/2017   Procedure: VIDEO BRONCHOSCOPY WITH ENDOBRONCHIAL NAVIGATION;  Surgeon: Collene Gobble, MD;  Location: [facility];  Service: Thoracic;  Laterality: N/A;  . VIDEO BRONCHOSCOPY WITH ENDOBRONCHIAL NAVIGATION N/A 01/31/2019   Procedure: VIDEO BRONCHOSCOPY WITH ENDOBRONCHIAL NAVIGATION;  Surgeon: Garner Nash, DO;  Location: [facility];  Service: Thoracic;  Laterality: N/A;  . VIDEO BRONCHOSCOPY WITH ENDOBRONCHIAL ULTRASOUND N/A 01/31/2019   Procedure: VIDEO BRONCHOSCOPY WITH ENDOBRONCHIAL ULTRASOUND;  Surgeon: Garner Nash, DO;  Location: [facility];  Service: Thoracic;  Laterality: N/A;  . WRIST FRACTURE  SURGERY Left 2018   with screws    REVIEW OF SYSTEMS:  Constitutional: positive for fatigue Eyes: negative Ears, nose, mouth, throat, and face: negative Respiratory: positive for dyspnea on exertion Cardiovascular: negative Gastrointestinal: negative Genitourinary:negative Integument/breast: negative Hematologic/lymphatic: negative Musculoskeletal:negative Neurological: negative Behavioral/Psych: negative Endocrine: negative Allergic/Immunologic: negative   PHYSICAL EXAMINATION: General appearance: alert, cooperative, fatigued and no distress Head: Normocephalic, without obvious abnormality, atraumatic Neck: no adenopathy, no JVD, supple, symmetrical, trachea midline and thyroid not enlarged, symmetric, no tenderness/mass/nodules Lymph nodes: Cervical, supraclavicular, and axillary nodes normal. Resp: clear to auscultation bilaterally Back: symmetric, no curvature. ROM normal. No CVA tenderness. Cardio: regular rate and rhythm, S1, S2 normal, no murmur, click, rub or gallop GI: soft, non-tender; bowel sounds normal;  no masses,  no organomegaly Extremities: extremities normal, atraumatic, no cyanosis or edema Neurologic: Alert and oriented X 3, normal strength and tone. Normal symmetric reflexes. Normal coordination and gait  ECOG PERFORMANCE STATUS: 1 - Symptomatic but completely ambulatory  Blood pressure (!) 161/64, pulse 78, temperature 97.8 F (36.6 C), temperature source Temporal, resp. rate 19, height 5' 5.5" (1.664 m), weight 229 lb 4.8 oz (104 kg), SpO2 98 %.  LABORATORY DATA: Lab Results  Component Value Date   WBC 14.0 (H) 02/05/2019   HGB 16.5 (H) 02/05/2019   HCT 50.5 (H) 02/05/2019   MCV 88.8 02/05/2019   PLT 457 (H) 02/05/2019      Chemistry      Component Value Date/Time   NA 140 02/05/2019 1336   K 4.5 02/05/2019 1336   CL 104 02/05/2019 1336   CO2 24 02/05/2019 1336   BUN 22 02/05/2019 1336   CREATININE 0.97 02/05/2019 1336      Component Value  Date/Time   CALCIUM 10.0 02/05/2019 1336   ALKPHOS 120 02/05/2019 1336   AST 53 (H) 02/05/2019 1336   ALT 80 (H) 02/05/2019 1336   BILITOT 1.0 02/05/2019 1336       RADIOGRAPHIC STUDIES: MR BRAIN W WO CONTRAST  Result Date: 02/14/2019 CLINICAL DATA:  Non-small cell lung cancer staging. EXAM: MRI HEAD WITHOUT AND WITH CONTRAST TECHNIQUE: Multiplanar, multiecho pulse sequences of the brain and surrounding structures were obtained without and with intravenous contrast. CONTRAST:  42m GADAVIST GADOBUTROL 1 MMOL/ML IV SOLN COMPARISON:  None. FINDINGS: Brain: There is no evidence of acute infarct, intracranial hemorrhage, mass, midline shift, or extra-axial fluid collection. Patchy T2 hyperintensities in the cerebral white matter bilaterally are nonspecific but compatible with chronic small vessel ischemic disease, relatively mild for age. No abnormal enhancement is identified. There is moderate cerebral atrophy. Vascular: Major intracranial vascular flow voids are preserved. Skull and upper cervical spine: Unremarkable bone marrow signal. Sinuses/Orbits: Bilateral cataract extraction. Paranasal sinuses and mastoid air cells are clear. Other: None. IMPRESSION: 1. No evidence of intracranial metastases. 2. Mild chronic small vessel ischemic disease. Electronically Signed   By: ALogan BoresM.D.   On: 02/14/2019 08:20   DG Chest Port 1 View  Result Date: 02/01/2019 CLINICAL DATA:  Lung cancer. EXAM: PORTABLE CHEST 1 VIEW COMPARISON:  01/31/2019 FINDINGS: Stable right upper lobe consolidation/mass with to fiducials in place. Small nodular lesion in the left upper lobe with 2 fiducials in place. No acute overlying pulmonary process is identified. No pneumothorax. No pleural effusions. Moderate-sized hiatal hernia. IMPRESSION: 1. Stable right upper lobe consolidation and fiducials. 2. Small left upper lobe nodule with 2 fiducials. 3. No acute overlying pulmonary process or pneumothorax. Electronically Signed    By: PMarijo SanesM.D.   On: 02/01/2019 07:02   DG CHEST PORT 1 VIEW  Result Date: 01/31/2019 CLINICAL DATA:  Left pneumothorax. EXAM: PORTABLE CHEST 1 VIEW COMPARISON:  Same day. FINDINGS: The heart size and mediastinal contours are within normal limits. No pneumothorax is noted. Stable right upper lobe opacity is noted consistent with mass and associated atelectasis. Stable left upper lobe mass is noted. The visualized skeletal structures are unremarkable. IMPRESSION: No pneumothorax is noted currently. Stable right upper lobe opacity is noted consistent with mass and associated atelectasis. Stable left upper lobe mass is noted. Electronically Signed   By: JMarijo ConceptionM.D.   On: 01/31/2019 19:42   DG CHEST PORT 1 VIEW  Result Date: 01/31/2019 CLINICAL DATA:  Post  bronchoscopy, history COPD, hypertension EXAM: PORTABLE CHEST 1 VIEW COMPARISON:  Portable exam 1703 hours compared to 08/23/2017 and correlated with CT chest of 12/05/2018 FINDINGS: Normal heart size mediastinal contours. Surgical clips/markers are seen within the upper lobes bilaterally. Progressive enlargement of RIGHT upper lobe mass since 2019. Additional small nodular focus is seen within the LEFT upper lobe corresponding to a previously identified smaller mass. Rotated to the LEFT. Mild central peribronchial thickening. Subsegmental atelectasis at RIGHT base. A thin curvilinear line identified at LEFT apex suspicious for a small LEFT apical pneumothorax. No pleural effusion, definite pneumothorax or definite acute infiltrate. Bones demineralized. IMPRESSION: Interval increase in size of RIGHT upper lobe mass since 2019 with relatively stable appearance of previously identified LEFT upper lobe mass. Bronchitic changes with subsegmental atelectasis at RIGHT base. Small probable LEFT apex pneumothorax. Findings called to [facility] in PACU on 01/31/2019 at 1720 hours. Electronically Signed   By: Lavonia Dana [name]   On: 01/31/2019 17:14    DG C-ARM BRONCHOSCOPY  Result Date: 01/31/2019 C-ARM BRONCHOSCOPY: Fluoroscopy was utilized by the requesting physician.  No radiographic interpretation.    ASSESSMENT AND PLAN: This is a very pleasant 85 years old white female with a stage IIIc/IV non-small cell lung cancer, poorly differentiated adenocarcinoma presented with right upper lobe lung mass in addition to mediastinal lymphadenopathy diagnosed in December 2020. There was insufficient material for molecular studies and I will send a blood test to [facility] for molecular studies. I had a lengthy discussion with the patient today about her current condition and treatment options.  I discussed with her the results of the MRI of the brain. I recommended for the patient a course of concurrent chemoradiation with weekly carboplatin for AUC of 2 and paclitaxel 45 mg/M2 for 6-7 weeks followed by consolidation treatment with immunotherapy if the patient has no evidence for disease progression after the induction phase. I discussed with the patient the adverse effect of the chemotherapy including but not limited to alopecia, myelosuppression, nausea and vomiting, peripheral neuropathy, liver or renal dysfunction. She is expected to see Dr. Sondra Come on February 27, 2019 and we will start her concurrent chemoradiation on March 04, 2019. The patient will come back for follow-up visit at that time. For hypertension she was advised to take her blood pressure medication and monitor it closely at home. She was advised to call immediately if she has any concerning symptoms in the interval. The patient voices understanding of current disease status and treatment options and is in agreement with the current care plan.  All questions were answered. The patient knows to call the clinic with any problems, questions or concerns. We can certainly see the patient much sooner if necessary.  I spent 15 minutes counseling the patient face to face. The total  time spent in the appointment was 25 minutes.  Disclaimer: This note was dictated with voice recognition software. Similar sounding words can inadvertently be transcribed and may not be corrected upon review.

## 2019-02-15 ENCOUNTER — Encounter: Payer: Self-pay | Admitting: *Deleted

## 2019-02-15 NOTE — Progress Notes (Signed)
Oncology Nurse Navigator Documentation  Oncology Nurse Navigator Flowsheets 02/15/2019  Abnormal Finding Date -  Confirmed Diagnosis Date -  Diagnosis Status Pending Molecular Studies  Navigator Follow Up Date: -  Navigator Follow Up Reason: -  Navigator Location [facility]  Navigator Encounter Type Clinic/MDC/I met Ms. Langdon yesterday at clinic yesterday.  I help to explain Dx and treatment plan.   Patient and her husband have just move to a dependent living facility where she is getting support for her and her husband.   It was a pleasure meeting Ms. Martorano today and gave her my contact information if she needs.    Patient Visit Type MedOnc  Treatment Phase Pre-Tx/Tx Discussion  Barriers/Navigation Needs Coordination of Care;Education  Education Newly Diagnosed Cancer Education;Other  Interventions Coordination of Care;Education  Acuity Level 2-Minimal Needs (1-2 Barriers Identified)  Coordination of Care Other  Education Method Verbal;Written  Support Groups/Services Other  Time Spent with Patient 44

## 2019-02-19 ENCOUNTER — Telehealth: Payer: Self-pay | Admitting: Internal Medicine

## 2019-02-19 NOTE — Telephone Encounter (Signed)
Scheduled per los. Called. No answer. Mailed printout

## 2019-02-25 ENCOUNTER — Inpatient Hospital Stay: Payer: PPO

## 2019-02-25 ENCOUNTER — Other Ambulatory Visit: Payer: Self-pay

## 2019-02-25 DIAGNOSIS — C3411 Malignant neoplasm of upper lobe, right bronchus or lung: Secondary | ICD-10-CM | POA: Diagnosis not present

## 2019-02-25 NOTE — Progress Notes (Signed)
Radiation Oncology         [phone] ________________________________  Initial Outpatient Consultation  Name: Ashley Parker MRN: [id number]  Date: 02/27/2019  DOB: 04/23/1931  OZ:DGUY, Ravisankar, MD  Prince Solian, MD   REFERRING PHYSICIAN: Prince Solian, MD  DIAGNOSIS: The encounter diagnosis was Adenocarcinoma of right lung, stage 3 ([place]).  Stage IIIC/IV (T4, N2, M0//M1a)Non-small cell carcinoma, poorly differentiated adenocarcinoma,  presented with large right upper lobe lung mass in addition to mediastinal lymphadenopathy and suspicious left upper lobe nodule diagnosed in December 2020.   HISTORY OF PRESENT ILLNESS::Ashley Parker is a 85 y.o. female who is accompanied by no one due to COVID-19 restrictions. The patient has a history of known bilateral pulmonary nodules. This was first found on 11/25/2014 on CT of chest that was performed for evaluation of persistent productive cough. More specifically, PET scan on 12/09/2014 showed a hypermetabolic solid and sub-solid nodule in the posterior aspect of the left upper lobe that was concerning for invasive adenocarcinoma as well as mild metabolic activity associated ground-glass nodule in the right upper lobe. Patient declined bronchoscopy and/or biopsy at that time and opted for surveillance strategy. Since then, patient has been followed closely by Dr. Chase Caller and Dr. Lamonte Sakai and has undergone several routine CT scans of chest to follow-up on lung nodules.  Of note, patient has a history of basal cell carcinoma of the face that was removed in 2016.  CT of chest on 07/04/2017 showed interval development of an 8 x 12 mm irregular soft tissue nodule within the right upper lobe, highly concerning for neoplasm. It also showed the posterior left upper lobe lesion that had progressed in the interval with solid soft tissue nodular component measuring 10 mm, highly concerning for neoplasm.   PET scan on 08/02/2017 showed new solid  right upper lobe nodule that was mildly hypermetabolic and suspicious for adenocarcinoma. It also showed the slowly enlarging solid component within the left upper lobe lesion that was mildly hypermetabolic and also suspicious for adenocarcinoma.   Patient underwent CT of chest and navigational bronchoscopy with biopsy on 08/23/2017. CT of chest showed mixed solid and ground-glass nodules in the upper lobes bilaterally, highly concerning for adenocarcinoma. However, no malignant cells were identified on biopsy of either upper lobes.  CT of chest done on 02/06/2018 showed significant growth of part of the solid right upper lobe nodule with increasingly solid components, highly worrisome for adenocarcinoma. It also showed a new mildly enlarged, nonspecific right paratracheal lymph node. Repeat tissue sampling was recommended. However, the patient declined this as she had many other things going on in her life.  CT of chest done on 05/10/2018 did not show a substantial change in the posterior left upper lobe or the posterior right upper lobe. However, there was some progression of mediastinal lymphadenopathy that ws concerning for metastatic disease.  CT of chest done on 09/22/2018 showed mild increase in size of 2.2 cm solid pulmonary nodule in the posterior right upper lobe, consistent with bronchogenic carcinoma, a new adjacent airspace opacity and thickening of interlobular septi, suspicious for early lymphangitic spread of carcinoma, stable mixed solid and sub-solid nodule in the posterior left upper lobe, consistent with adenocarcinoma, and a mild increase in mediastinal and right hilar lymphadenopathy, high suspicious for metastatic disease. Patient was once again offered bronchoscopy, which she deferred and requested conservative management and repeat CT scan in three months.  CT of chest done on 12/05/2018 showed dramatic progression of disease with enlargement of  the previously noted right upper  lobe nodule, which was now a large mass-like area measuring 9.6 x 6.6. x 5.4 cm with surrounding changes concerning for developing lymphangitic spread of disease. It also showed progressive mediastinal lymphadenopathy. Previously noted left upper lobe was similar to prior study.  Patient underwent video endobronchial ultrasound with transbronchial needle aspirations of mediastinal mass and right upper lobe lung mass and video bronchoscopy with navigation to the left upper lobe nodule on 01/31/2019. Malignant cells were found to be present in the left upper lobe, lymph node 7, and lymph node 4R, consistent with metastatic lung adenocarcinoma.  MRI of brain on 02/13/2019 showed no evidence of intracranial metastasis.  Patient is scheduled to begin concurrent chemoradiation with weekly carboplatin for AUC of 2 and Paclitaxel 45 mg/M2 on 03/04/2019 under the care of Dr. Julien Nordmann.  PREVIOUS RADIATION THERAPY: No  PAST MEDICAL HISTORY:  Past Medical History:  Diagnosis Date  . Arthritis   . Basal cell carcinoma (BCC) of skin of face 2016   removed in 2016  . COPD (chronic obstructive pulmonary disease) (HCC)    Mild  . Crohn's ileitis ([place])    Mild, mostly asymptomatic, not on therapy  . Fundic gland polyposis of stomach   . GERD (gastroesophageal reflux disease)   . Glaucoma   . Hemorrhoids    Internal and External  . History of hiatal hernia   . Hypertension   . Macular degeneration   . Obesity, unspecified   . Osteoporosis   . Pelvic fracture ([place]) 2007  . Stricture of esophagus    distal  . Unspecified vitamin D deficiency     PAST SURGICAL HISTORY: Past Surgical History:  Procedure Laterality Date  . APPENDECTOMY    . BACK SURGERY    . CATARACT EXTRACTION    . COLONOSCOPY  07/27/2001, 07/08/2011   Multiple  . ESOPHAGEAL DILATION  2013  . ESOPHAGOGASTRODUODENOSCOPY     multiple  . LAPAROSCOPIC APPENDECTOMY N/A 02/19/2014   Procedure: APPENDECTOMY LAPAROSCOPIC;  Surgeon:  Erroll Luna, MD;  Location: [facility];  Service: General;  Laterality: N/A;  . MINOR PLACEMENT OF FIDUCIAL  08/23/2017   Procedure: MINOR PLACEMENT OF FIDUCIAL - Left Upper Lobe Lung x2, Right Upper Lobe Lung x3;  Surgeon: Collene Gobble, MD;  Location: [facility];  Service: Thoracic;;  . TONSILLECTOMY AND ADENOIDECTOMY     and adenoids   . TOTAL KNEE ARTHROPLASTY Left 2007  . VIDEO BRONCHOSCOPY WITH ENDOBRONCHIAL NAVIGATION N/A 08/23/2017   Procedure: VIDEO BRONCHOSCOPY WITH ENDOBRONCHIAL NAVIGATION;  Surgeon: Collene Gobble, MD;  Location: [facility];  Service: Thoracic;  Laterality: N/A;  . VIDEO BRONCHOSCOPY WITH ENDOBRONCHIAL NAVIGATION N/A 01/31/2019   Procedure: VIDEO BRONCHOSCOPY WITH ENDOBRONCHIAL NAVIGATION;  Surgeon: Garner Nash, DO;  Location: [facility];  Service: Thoracic;  Laterality: N/A;  . VIDEO BRONCHOSCOPY WITH ENDOBRONCHIAL ULTRASOUND N/A 01/31/2019   Procedure: VIDEO BRONCHOSCOPY WITH ENDOBRONCHIAL ULTRASOUND;  Surgeon: Garner Nash, DO;  Location: [facility];  Service: Thoracic;  Laterality: N/A;  . WRIST FRACTURE SURGERY Left 2018   with screws    FAMILY HISTORY:  Family History  Problem Relation Age of Onset  . Prostate cancer Father   . Breast cancer Mother   . Heart disease Mother   . Uterine cancer Mother   . Colon cancer Neg Hx     SOCIAL HISTORY:  Social History   Tobacco Use  . Smoking status: Former Smoker    Packs/day: 1.00    Years:  32.00    Pack years: 32.00    Types: Cigarettes    Quit date: 02/18/1981    Years since quitting: 38.0  . Smokeless tobacco: Never Used  Substance Use Topics  . Alcohol use: Yes    Alcohol/week: 0.0 standard drinks    Comment: 1 drink daily   . Drug use: No    ALLERGIES:  Allergies  Allergen Reactions  . Wasp Venom Anaphylaxis and Other (See Comments)    Severe pain in lower stomach, sweatiness, and BP drops low   . Biaxin [Clarithromycin] Other (See Comments)    MYALGIAS ACHES  . Brimonidine Dermatitis and Other  (See Comments)    Redness/Peeling of skin    MEDICATIONS:  Current Outpatient Medications  Medication Sig Dispense Refill  . albuterol (VENTOLIN HFA) 108 (90 Base) MCG/ACT inhaler Inhale 1 puff into the lungs as needed for wheezing or shortness of breath.    . Ascorbic Acid (VITAMIN C) 500 MG CHEW Chew 500 mg by mouth daily.    . B Complex-C (SUPER B COMPLEX PO) Take 1 tablet by mouth daily.    . Calcium Carb-Cholecalciferol (CALCIUM 600+D3 PO) Take 1-2 tablets by mouth See admin instructions. Take 1 tablet in the morning & 2 tablet at night    . calcium elemental as carbonate (BARIATRIC TUMS ULTRA) 400 MG chewable tablet Chew 2,000 mg by mouth at bedtime.    . Cholecalciferol (VITAMIN D3) 2000 units TABS Take 2,000 Units by mouth daily.     . diclofenac Sodium (VOLTAREN) 1 % GEL Apply 1 application topically 4 (four) times daily as needed (pain.).     [name] dorzolamide (TRUSOPT) 2 % ophthalmic solution Place 1 drop into the left eye 2 (two) times daily.     [name] EPINEPHrine 0.3 mg/0.3 mL IJ SOAJ injection Inject 0.3 mg into the muscle as needed (for allergic reaction).    . Flaxseed, Linseed, (FLAXSEED OIL PO) Take 1 tablet by mouth daily.    . fluticasone (FLONASE) 50 MCG/ACT nasal spray Place 1 spray into both nostrils daily.     . irbesartan (AVAPRO) 300 MG tablet Take 300 mg by mouth daily.     [name] LUMIGAN 0.01 % SOLN Place 1 drop into the right eye at bedtime.   4  . Magnesium 250 MG TABS Take 250 mg by mouth daily.    . Multiple Vitamins-Minerals (PRESERVISION AREDS 2 PO) Take 1 tablet by mouth 2 (two) times daily.    . Omega-3 Fatty Acids (FISH OIL) 1000 MG CAPS Take 1,000 mg by mouth daily.    Vladimir Faster Glycol-Propyl Glycol (SYSTANE) 0.4-0.3 % SOLN Place 1 drop into both eyes 3 (three) times daily as needed (for tired/dry eyes.).     [name] timolol (TIMOPTIC) 0.5 % ophthalmic solution Place 1 drop into the left eye 2 (two) times daily.    . Tiotropium Bromide-Olodaterol (STIOLTO RESPIMAT)  2.5-2.5 MCG/ACT AERS Inhale 2 puffs into the lungs daily. 2 g 0  . TURMERIC PO Take 1,500 mg by mouth daily.    . vitamin B-12 (CYANOCOBALAMIN) 1000 MCG tablet Take 1,000 mcg by mouth daily.    . vitamin E 400 UNIT capsule Take 400 Units by mouth daily. (180 mg)    . Zinc 30 MG TABS Take 30 mg by mouth daily.    . prochlorperazine (COMPAZINE) 10 MG tablet Take 1 tablet (10 mg total) by mouth every 6 (six) hours as needed for nausea or vomiting. (Patient not taking: Reported on 02/26/2019)  30 tablet 0   No current facility-administered medications for this encounter.    REVIEW OF SYSTEMS:  A 10+ POINT REVIEW OF SYSTEMS WAS OBTAINED including neurology, dermatology, psychiatry, cardiac, respiratory, lymph, extremities, GI, GU, musculoskeletal, constitutional, reproductive, HEENT.  She reports worsening of her hearing as well as vision.  She is unable to drive at this time.  She denies any new headaches or new back pain   PHYSICAL EXAM:  weight is 223 lb 6.4 oz (101.3 kg). Her temperature is 98 F (36.7 C). Her blood pressure is 146/60 (abnormal) and her pulse is 86. Her respiration is 20 and oxygen saturation is 98%.   General: Alert and oriented, in no acute distress HEENT: Head is normocephalic. Extraocular movements are intact. Oropharynx is clear.  Dentures in place. Neck: Neck is supple, no palpable cervical or supraclavicular lymphadenopathy. Heart: Regular in rate and rhythm with no murmurs, rubs, or gallops. Chest: Clear to auscultation bilaterally, with no rhonchi, wheezes, or rales. Abdomen: Soft, nontender, nondistended, with no rigidity or guarding. Extremities: No cyanosis or edema. Lymphatics: see Neck Exam Skin: No concerning lesions. Musculoskeletal: symmetric strength and muscle tone throughout. Neurologic: Cranial nerves II through XII are grossly intact. No obvious focalities. Speech is fluent. Coordination is intact. Psychiatric: Judgment and insight are intact. Affect is  appropriate.   ECOG = 1  0 - Asymptomatic (Fully active, able to carry on all predisease activities without restriction)  1 - Symptomatic but completely ambulatory (Restricted in physically strenuous activity but ambulatory and able to carry out work of a light or sedentary nature. For example, light housework, office work)  2 - Symptomatic, <50% in bed during the day (Ambulatory and capable of all self care but unable to carry out any work activities. Up and about more than 50% of waking hours)  3 - Symptomatic, >50% in bed, but not bedbound (Capable of only limited self-care, confined to bed or chair 50% or more of waking hours)  4 - Bedbound (Completely disabled. Cannot carry on any self-care. Totally confined to bed or chair)  5 - Death   Eustace Pen MM, Creech RH, Tormey DC, et al. [phone]). "Toxicity and response criteria of the [facility]". [place] Oncol. 5 (6): 649-55  LABORATORY DATA:  Lab Results  Component Value Date   WBC 14.0 (H) 02/05/2019   HGB 16.5 (H) 02/05/2019   HCT 50.5 (H) 02/05/2019   MCV 88.8 02/05/2019   PLT 457 (H) 02/05/2019   NEUTROABS 11.3 (H) 02/05/2019   Lab Results  Component Value Date   NA 140 02/05/2019   K 4.5 02/05/2019   CL 104 02/05/2019   CO2 24 02/05/2019   GLUCOSE 196 (H) 02/05/2019   CREATININE 0.97 02/05/2019   CALCIUM 10.0 02/05/2019      RADIOGRAPHY: MR BRAIN W WO CONTRAST  Result Date: 02/14/2019 CLINICAL DATA:  Non-small cell lung cancer staging. EXAM: MRI HEAD WITHOUT AND WITH CONTRAST TECHNIQUE: Multiplanar, multiecho pulse sequences of the brain and surrounding structures were obtained without and with intravenous contrast. CONTRAST:  5m GADAVIST GADOBUTROL 1 MMOL/ML IV SOLN COMPARISON:  None. FINDINGS: Brain: There is no evidence of acute infarct, intracranial hemorrhage, mass, midline shift, or extra-axial fluid collection. Patchy T2 hyperintensities in the cerebral white matter bilaterally are  nonspecific but compatible with chronic small vessel ischemic disease, relatively mild for age. No abnormal enhancement is identified. There is moderate cerebral atrophy. Vascular: Major intracranial vascular flow voids are preserved. Skull and upper cervical  spine: Unremarkable bone marrow signal. Sinuses/Orbits: Bilateral cataract extraction. Paranasal sinuses and mastoid air cells are clear. Other: None. IMPRESSION: 1. No evidence of intracranial metastases. 2. Mild chronic small vessel ischemic disease. Electronically Signed   By: Logan Bores [name]   On: 02/14/2019 08:20   DG Chest Port 1 View  Result Date: 02/01/2019 CLINICAL DATA:  Lung cancer. EXAM: PORTABLE CHEST 1 VIEW COMPARISON:  01/31/2019 FINDINGS: Stable right upper lobe consolidation/mass with to fiducials in place. Small nodular lesion in the left upper lobe with 2 fiducials in place. No acute overlying pulmonary process is identified. No pneumothorax. No pleural effusions. Moderate-sized hiatal hernia. IMPRESSION: 1. Stable right upper lobe consolidation and fiducials. 2. Small left upper lobe nodule with 2 fiducials. 3. No acute overlying pulmonary process or pneumothorax. Electronically Signed   By: Marijo Sanes [name]   On: 02/01/2019 07:02   DG CHEST PORT 1 VIEW  Result Date: 01/31/2019 CLINICAL DATA:  Left pneumothorax. EXAM: PORTABLE CHEST 1 VIEW COMPARISON:  Same day. FINDINGS: The heart size and mediastinal contours are within normal limits. No pneumothorax is noted. Stable right upper lobe opacity is noted consistent with mass and associated atelectasis. Stable left upper lobe mass is noted. The visualized skeletal structures are unremarkable. IMPRESSION: No pneumothorax is noted currently. Stable right upper lobe opacity is noted consistent with mass and associated atelectasis. Stable left upper lobe mass is noted. Electronically Signed   By: Marijo Conception [name]   On: 01/31/2019 19:42   DG CHEST PORT 1 VIEW  Result Date:  01/31/2019 CLINICAL DATA:  Post bronchoscopy, history COPD, hypertension EXAM: PORTABLE CHEST 1 VIEW COMPARISON:  Portable exam 1703 hours compared to 08/23/2017 and correlated with CT chest of 12/05/2018 FINDINGS: Normal heart size mediastinal contours. Surgical clips/markers are seen within the upper lobes bilaterally. Progressive enlargement of RIGHT upper lobe mass since 2019. Additional small nodular focus is seen within the LEFT upper lobe corresponding to a previously identified smaller mass. Rotated to the LEFT. Mild central peribronchial thickening. Subsegmental atelectasis at RIGHT base. A thin curvilinear line identified at LEFT apex suspicious for a small LEFT apical pneumothorax. No pleural effusion, definite pneumothorax or definite acute infiltrate. Bones demineralized. IMPRESSION: Interval increase in size of RIGHT upper lobe mass since 2019 with relatively stable appearance of previously identified LEFT upper lobe mass. Bronchitic changes with subsegmental atelectasis at RIGHT base. Small probable LEFT apex pneumothorax. Findings called to [facility] in PACU on 01/31/2019 at 1720 hours. Electronically Signed   By: Lavonia Dana [name]   On: 01/31/2019 17:14   DG C-ARM BRONCHOSCOPY  Result Date: 01/31/2019 C-ARM BRONCHOSCOPY: Fluoroscopy was utilized by the requesting physician.  No radiographic interpretation.      IMPRESSION: stage IIIc/IV (T4, N2, [name], poorly differentiated adenocarcinoma,  presented with large right upper lobe lung mass in addition to mediastinal lymphadenopathy and  left upper lobe nodule diagnosed in December 2020.  Patient has had significant progression of disease  in the right upper lobe since her most recent imaging indicating conversion to a more aggressive tumor.    she will be a good candidate for a definitive course of radiation therapy along with radiosensitizing chemotherapy.  This will cover the large right upper lung mass as well  as mediastinal adenopathy and the biopsy-proven left upper lung mass adjacent to fiducial markers.  Today, I talked to the patient  about the findings and work-up thus far.  We discussed the natural history of non-small cell  lung cancer and general treatment, highlighting the role of radiotherapy in the management.  We discussed the available radiation techniques, and focused on the details of logistics and delivery.  We reviewed the anticipated acute and late sequelae associated with radiation in this setting.  The patient was encouraged to ask questions that I answered to the best of my ability.  A patient consent form was discussed and signed.  We retained a copy for our records.  The patient would like to proceed with radiation and will be scheduled for CT simulation.  PLAN: Patient is to begin concurrent chemoradiation with weekly carboplatin for AUC of 2 and paclitaxel 45 mg/M2 on 03/04/2019 under the care of Dr. Julien Nordmann.  CT simulation scheduled for tomorrow at 10 AM.  Radiation therapy to start next week.  Anticipate 6 weeks of radiation therapy.    ------------------------------------------------  Blair Promise, PhD, MD  This document serves as a record of services personally performed by Gery Pray, MD. It was created on his behalf by Clerance Lav, a trained medical scribe. The creation of this record is based on the scribe's personal observations and the provider's statements to them. This document has been checked and approved by the attending provider.

## 2019-02-26 ENCOUNTER — Encounter: Payer: Self-pay | Admitting: Emergency Medicine

## 2019-02-26 ENCOUNTER — Ambulatory Visit (INDEPENDENT_AMBULATORY_CARE_PROVIDER_SITE_OTHER): Payer: PPO | Admitting: Emergency Medicine

## 2019-02-26 DIAGNOSIS — J449 Chronic obstructive pulmonary disease, unspecified: Secondary | ICD-10-CM

## 2019-02-26 MED ORDER — STIOLTO RESPIMAT 2.5-2.5 MCG/ACT IN AERS: 2.0000 | INHALATION_SPRAY | Freq: Every day | RESPIRATORY_TRACT | 0 refills | Status: DC

## 2019-02-26 NOTE — Addendum Note (Signed)
Addended by: Len Blalock on: 02/26/2019 11:34 AM   Modules accepted: Orders

## 2019-02-26 NOTE — Progress Notes (Signed)
Virtual Visit via Telephone Note  I connected with Ashley Parker on 02/26/19 at 11:00 AM EST by telephone and verified that I am speaking with the correct person using two identifiers.  Location: Patient: Home Provider: Office   I discussed the limitations, risks, security and privacy concerns of performing an evaluation and management service by telephone and the availability of in person appointments. I also discussed with the patient that there may be a patient responsible charge related to this service. The patient expressed understanding and agreed to proceed.   History of Present Illness: Ashley Parker is 55, former smoker with COPD and hypertension.  She had progression of right upper lobe nodular mass and persistent mediastinal adenopathy following nondiagnostic navigational bronchoscopy in June 2019.  After cardiac clearance I have plan to arrange for repeat ENB.  This was ultimately done by Dr. Valeta Harms on 01/31/2019.  The right upper lobe biopsies were consistent with stage IIIc or stage IV poorly differentiated adenocarcinoma.  She has been referred to Dr. Earlie Server   Observations/Objective: Patient set up for concurrent chemoradiation. She is about to start XRT. She is having exertional SOB, has used a wheelchair sometimes. She has Arnuity but not sure that it does much for her. She has albuterol, uses about once a day.   Assessment and Plan: Doubt that she is getting any benefit from the ICS, she needs scheduled BD therapy. Will change her regimen.   Will stop Arnuity.  Will do a trial of Stiolto for a month to see if she benefits Will confirm that she is supposed to keep her XRT Consult tomorrow w Dr Sondra Come. Will call if if not.   Follow Up Instructions: 1 month   I discussed the assessment and treatment plan with the patient. The patient was provided an opportunity to ask questions and all were answered. The patient agreed with the plan and demonstrated an understanding of the  instructions.   The patient was advised to call back or seek an in-person evaluation if the symptoms worsen or if the condition fails to improve as anticipated.  I provided 15 minutes of non-face-to-face time during this encounter.   Collene Gobble, MD

## 2019-02-26 NOTE — Addendum Note (Signed)
Addended by: Tery Sanfilippo R on: 02/26/2019 11:44 AM   Modules accepted: Orders

## 2019-02-27 ENCOUNTER — Other Ambulatory Visit: Payer: Self-pay

## 2019-02-27 ENCOUNTER — Encounter: Payer: Self-pay | Admitting: Radiation Oncology

## 2019-02-27 ENCOUNTER — Ambulatory Visit
Admission: RE | Admit: 2019-02-27 | Discharge: 2019-02-27 | Disposition: A | Discharge status: Home / Self Care | Payer: PPO | Source: Ambulatory Visit | Attending: Radiation Oncology | Admitting: Radiation Oncology

## 2019-02-27 VITALS — BP 146/60 | HR 86 | Temp 98.0°F | Resp 20 | Wt 223.4 lb

## 2019-02-27 DIAGNOSIS — M129 Arthropathy, unspecified: Secondary | ICD-10-CM | POA: Diagnosis not present

## 2019-02-27 DIAGNOSIS — Z8042 Family history of malignant neoplasm of prostate: Secondary | ICD-10-CM | POA: Insufficient documentation

## 2019-02-27 DIAGNOSIS — C3411 Malignant neoplasm of upper lobe, right bronchus or lung: Secondary | ICD-10-CM | POA: Insufficient documentation

## 2019-02-27 DIAGNOSIS — K449 Diaphragmatic hernia without obstruction or gangrene: Secondary | ICD-10-CM | POA: Diagnosis not present

## 2019-02-27 DIAGNOSIS — M810 Age-related osteoporosis without current pathological fracture: Secondary | ICD-10-CM | POA: Diagnosis not present

## 2019-02-27 DIAGNOSIS — Z85828 Personal history of other malignant neoplasm of skin: Secondary | ICD-10-CM | POA: Diagnosis not present

## 2019-02-27 DIAGNOSIS — Z803 Family history of malignant neoplasm of breast: Secondary | ICD-10-CM | POA: Insufficient documentation

## 2019-02-27 DIAGNOSIS — Z87891 Personal history of nicotine dependence: Secondary | ICD-10-CM | POA: Diagnosis not present

## 2019-02-27 DIAGNOSIS — C3491 Malignant neoplasm of unspecified part of right bronchus or lung: Secondary | ICD-10-CM

## 2019-02-27 DIAGNOSIS — K219 Gastro-esophageal reflux disease without esophagitis: Secondary | ICD-10-CM | POA: Diagnosis not present

## 2019-02-27 DIAGNOSIS — C781 Secondary malignant neoplasm of mediastinum: Secondary | ICD-10-CM | POA: Diagnosis not present

## 2019-02-27 DIAGNOSIS — R918 Other nonspecific abnormal finding of lung field: Secondary | ICD-10-CM | POA: Diagnosis not present

## 2019-02-27 DIAGNOSIS — Z79899 Other long term (current) drug therapy: Secondary | ICD-10-CM | POA: Insufficient documentation

## 2019-02-27 DIAGNOSIS — C3412 Malignant neoplasm of upper lobe, left bronchus or lung: Secondary | ICD-10-CM | POA: Diagnosis not present

## 2019-02-27 DIAGNOSIS — J449 Chronic obstructive pulmonary disease, unspecified: Secondary | ICD-10-CM | POA: Diagnosis not present

## 2019-02-27 DIAGNOSIS — E669 Obesity, unspecified: Secondary | ICD-10-CM | POA: Diagnosis not present

## 2019-02-27 DIAGNOSIS — I10 Essential (primary) hypertension: Secondary | ICD-10-CM | POA: Insufficient documentation

## 2019-02-27 NOTE — Progress Notes (Signed)
Thoracic Location of Tumor / Histology: DIAGNOSIS: stage IIIc/IV (T4, N2, [name], poorly differentiated adenocarcinoma,  presented with large right upper lobe lung mass in addition to mediastinal lymphadenopathy and suspicious left upper lobe nodule diagnosed in December 2020.  Patient presented with symptoms of: The patient is followed by Dr. Lamonte Sakai for her history of COPD and was noted on previous CT scan of the chest on Jul 04, 2017 to have development of 0.8 x 1.2 cm irregular soft tissue nodule within the right upper lobe groundglass nodule that was seen previously concerning for neoplasm.  There was also a posterior left upper lobe lesion that was also progressed in the interval.  The patient had a PET scan on August 02, 2017 and the new solid right upper lobe nodule was mildly hypermetabolic suspicious for adenocarcinoma.  The slowly enlarging solid component within the part solid left upper lobe lesion is also mildly hypermetabolic with similar intensity to previous PET scan in 2016.  These were suspicious for adenocarcinoma.  No evidence of metastatic disease outside the chest.  On August 23, 2017 the patient underwent bronchoscopy under the care of Dr. Lamonte Sakai and the final cytology was not conclusive for malignancy.  The patient was followed by observation and repeat CT scan of the chest on February 06, 2018 showed significant growth of the part solid right upper lobe nodule over the last 6 months with increasingly solid component highly worrisome for adenocarcinoma and repeat tissue sampling was recommended.  The part solid left upper lobe nodule had not significantly changed in the interval.  There was also a new mildly enlarged right paratracheal lymph node.  She was followed closely with repeat imaging studies under the care of Dr. Lamonte Sakai and repeat CT scan of the chest on December 05, 2018 showed dramatic progression of disease with enlargement of the previously noted right upper  lobe nodule which now is a large masslike area measuring 9.6 x 6.6 x 5.4 cm with surrounding changes concerning for developing lymphangitic spread of disease.  There was also associated progressive mediastinal lymphadenopathy.  The previously noted left upper lobe nodule is very similar to the prior study. On January 31, 2019 the patient underwent repeat bronchoscopy with endobronchial ultrasound and biopsies under the care of Dr. Valeta Harms.  The final pathology is still pending.  Biopsies revealed: Her final pathology was consistent with poorly differentiated adenocarcinoma but there was insufficient material for molecular studies.  Tobacco/Marijuana/Snuff/ETOH use:  Tobacco Use  . Smoking status: Former Smoker    Packs/day: 1.00    Years: 32.00    Pack years: 32.00    Types: Cigarettes    Quit date: 02/18/1981    Years since quitting: 37.9  . Smokeless tobacco: Never Used  Substance Use Topics  . Alcohol use: Yes    Alcohol/week: 0.0 standard drinks    Comment: 1 drink daily   . Drug use: No      Past/Anticipated interventions by medical oncology, if any: Per Dr. Julien Nordmann 02/14/19:  ASSESSMENT AND PLAN: This is a very pleasant 85 years old white female with a stage IIIc/IV non-small cell lung cancer, poorly differentiated adenocarcinoma presented with right upper lobe lung mass in addition to mediastinal lymphadenopathy diagnosed in December 2020. There was insufficient material for molecular studies and I will send a blood test to [facility] for molecular studies. I had a lengthy discussion with the patient today about her current condition and treatment options.  I discussed with her the results of  the MRI of the brain. I recommended for the patient a course of concurrent chemoradiation with weekly carboplatin for AUC of 2 and paclitaxel 45 mg/M2 for 6-7 weeks followed by consolidation treatment with immunotherapy if the patient has no evidence for disease progression  after the induction phase. I discussed with the patient the adverse effect of the chemotherapy including but not limited to alopecia, myelosuppression, nausea and vomiting, peripheral neuropathy, liver or renal dysfunction. She is expected to see Dr. Sondra Come on February 27, 2019 and we will start her concurrent chemoradiation on March 04, 2019. The patient will come back for follow-up visit at that time. For hypertension she was advised to take her blood pressure medication and monitor it closely at home.  Signs/Symptoms The patient is feeling fine today with no concerning complaints except for fatigue.  She is under a lot of stress moving to a different residency.  She denied having any fever or chills.  She has no nausea, vomiting, diarrhea or constipation.  She denied having any headache or visual changes.  She had MRI of the brain performed recently that showed no evidence of metastatic disease to the brain.  Pt reports occasional cough "tickle in the throat" with occasional clear phlegm. Pt denies hemoptysis. Pt reports SOB with walking.   Pain issues, if any:  Pt denies c/o pain.  SAFETY ISSUES:  Prior radiation? No  Pacemaker/ICD? No   Possible current pregnancy?No  Is the patient on methotrexate? No  Current Complaints / other details:  Pt presents today for initial consult with Dr. Sondra Come for Radiation Oncology.  BP (!) 146/60 (BP Location: Right Arm, Patient Position: Sitting, Cuff Size: Large)   Pulse 86   Temp 98 F (36.7 C)   Resp 20   Wt 223 lb 6.4 oz (101.3 kg)   SpO2 98%   BMI 36.61 kg/m   Wt Readings from Last 3 Encounters:  02/27/19 223 lb 6.4 oz (101.3 kg)  02/14/19 229 lb 4.8 oz (104 kg)  02/05/19 228 lb 9.6 oz (103.7 kg)   Loma Sousa, RN BSN

## 2019-02-27 NOTE — Patient Instructions (Signed)
Coronavirus (COVID-19) Are you at risk?  Are you at risk for the Coronavirus (COVID-19)?  To be considered HIGH RISK for Coronavirus (COVID-19), you have to meet the following criteria:  . Traveled to [place], [place], [place], [place] or [place]; or in the [place] to [place], [place], [place], or [place]; and have fever, cough, and shortness of breath within the last 2 weeks of travel OR . Been in close contact with a person diagnosed with COVID-19 within the last 2 weeks and have fever, cough, and shortness of breath . IF YOU DO NOT MEET THESE CRITERIA, YOU ARE CONSIDERED LOW RISK FOR COVID-19.  What to do if you are HIGH RISK for COVID-19?  [name] If you are having a medical emergency, call 911. . Seek medical care right away. Before you go to a doctor's office, urgent care or emergency department, call ahead and tell them about your recent travel, contact with someone diagnosed with COVID-19, and your symptoms. You should receive instructions from your physician's office regarding next steps of care.  . When you arrive at healthcare provider, tell the healthcare staff immediately you have returned from visiting [place], [place], [place], [place] or [place]; or traveled in the [place] to [place], [place], [place], or [place]; in the last two weeks or you have been in close contact with a person diagnosed with COVID-19 in the last 2 weeks.   . Tell the health care staff about your symptoms: fever, cough and shortness of breath. . After you have been seen by a medical provider, you will be either: o Tested for (COVID-19) and discharged home on quarantine except to seek medical care if symptoms worsen, and asked to  - Stay home and avoid contact with others until you get your results (4-5 days)  - Avoid travel on public transportation if possible (such as bus, train, or airplane) or o Sent to the Emergency Department by EMS for evaluation, COVID-19 testing, and possible  admission depending on your condition and test results.  What to do if you are LOW RISK for COVID-19?  Reduce your risk of any infection by using the same precautions used for avoiding the common cold or flu:  [name] Wash your hands often with soap and warm water for at least 20 seconds.  If soap and water are not readily available, use an alcohol-based hand sanitizer with at least 60% alcohol.  . If coughing or sneezing, cover your mouth and nose by coughing or sneezing into the elbow areas of your shirt or coat, into a tissue or into your sleeve (not your hands). . Avoid shaking hands with others and consider head nods or verbal greetings only. . Avoid touching your eyes, nose, or mouth with unwashed hands.  . Avoid close contact with people who are sick. . Avoid places or events with large numbers of people in one location, like concerts or sporting events. . Carefully consider travel plans you have or are making. . If you are planning any travel outside or inside the [place], visit the CDC's Travelers' Health webpage for the latest health notices. . If you have some symptoms but not all symptoms, continue to monitor at home and seek medical attention if your symptoms worsen. . If you are having a medical emergency, call 911.   [place] / e-Visit: [email or web address]         [facility]: [place]  [facility]: [facility]: [phone]

## 2019-02-28 ENCOUNTER — Other Ambulatory Visit: Payer: Self-pay

## 2019-02-28 ENCOUNTER — Ambulatory Visit
Admission: RE | Admit: 2019-02-28 | Discharge: 2019-02-28 | Disposition: A | Discharge status: Home / Self Care | Payer: PPO | Source: Ambulatory Visit | Attending: Radiation Oncology | Admitting: Radiation Oncology

## 2019-02-28 DIAGNOSIS — C3491 Malignant neoplasm of unspecified part of right bronchus or lung: Secondary | ICD-10-CM

## 2019-02-28 DIAGNOSIS — R590 Localized enlarged lymph nodes: Secondary | ICD-10-CM | POA: Diagnosis not present

## 2019-02-28 DIAGNOSIS — R911 Solitary pulmonary nodule: Secondary | ICD-10-CM | POA: Insufficient documentation

## 2019-02-28 DIAGNOSIS — C3411 Malignant neoplasm of upper lobe, right bronchus or lung: Secondary | ICD-10-CM | POA: Insufficient documentation

## 2019-02-28 NOTE — Progress Notes (Signed)
  Radiation Oncology         [phone] ________________________________  Name: Ashley Parker MRN: [id number]  Date: 02/28/2019  DOB: 1931/05/27  SIMULATION AND TREATMENT PLANNING NOTE   DIAGNOSIS:  Stage IIIC/IV (T4, N2, M0//M1a)Non-small cell carcinoma, poorly differentiated adenocarcinoma,presented with large right upper lobe lung mass in addition to mediastinal lymphadenopathy and  left upper lobe nodulediagnosed in December 2020.   NARRATIVE:  The patient was brought to the [facility].  Identity was confirmed.  All relevant records and images related to the planned course of therapy were reviewed.  The patient freely provided informed written consent to proceed with treatment after reviewing the details related to the planned course of therapy. The consent form was witnessed and verified by the simulation staff.  Then, the patient was set-up in a stable reproducible  supine position for radiation therapy.  CT images were obtained.  Surface markings were placed.  The CT images were loaded into the planning software.  Then the target and avoidance structures were contoured.  Treatment planning then occurred.  The radiation prescription was entered and confirmed.  Then, I designed and supervised the construction of a total of 4 medically necessary complex treatment devices.  I have requested : Intensity Modulated Radiotherapy (IMRT) is medically necessary for this case for the following reason:  Dose homogeneity and coverage of contralateral lung nodule. I have ordered:dose calc.  PLAN:  The patient will receive 60 Gy in 30 fractions.    Special Treatment Procedure Note: The patient will be receiving radiosensitizing chemotherapy. Given the potential of increased toxicities related to combined therapy and the necessity for close monitoring of the patient and blood work, this constitutes a special treatment procedure.   -----------------------------------  Blair Promise, PhD, MD  This document serves as a record of services personally performed by Gery Pray, MD. It was created on his behalf by Wilburn Mylar, a trained medical scribe. The creation of this record is based on the scribe's personal observations and the provider's statements to them. This document has been checked and approved by the attending provider.

## 2019-03-04 ENCOUNTER — Other Ambulatory Visit: Payer: Self-pay

## 2019-03-04 ENCOUNTER — Encounter: Payer: Self-pay | Admitting: Internal Medicine

## 2019-03-04 ENCOUNTER — Inpatient Hospital Stay: Payer: PPO | Attending: Internal Medicine | Admitting: Internal Medicine

## 2019-03-04 ENCOUNTER — Inpatient Hospital Stay: Payer: PPO

## 2019-03-04 VITALS — BP 128/61 | HR 78 | Temp 98.0°F | Resp 17

## 2019-03-04 VITALS — BP 153/68 | HR 92 | Temp 98.8°F | Resp 19 | Ht 65.0 in | Wt 222.3 lb

## 2019-03-04 DIAGNOSIS — M1990 Unspecified osteoarthritis, unspecified site: Secondary | ICD-10-CM | POA: Diagnosis not present

## 2019-03-04 DIAGNOSIS — Z5111 Encounter for antineoplastic chemotherapy: Secondary | ICD-10-CM | POA: Diagnosis not present

## 2019-03-04 DIAGNOSIS — R197 Diarrhea, unspecified: Secondary | ICD-10-CM | POA: Diagnosis not present

## 2019-03-04 DIAGNOSIS — C3411 Malignant neoplasm of upper lobe, right bronchus or lung: Secondary | ICD-10-CM | POA: Insufficient documentation

## 2019-03-04 DIAGNOSIS — I10 Essential (primary) hypertension: Secondary | ICD-10-CM | POA: Insufficient documentation

## 2019-03-04 DIAGNOSIS — R05 Cough: Secondary | ICD-10-CM | POA: Insufficient documentation

## 2019-03-04 DIAGNOSIS — R590 Localized enlarged lymph nodes: Secondary | ICD-10-CM | POA: Diagnosis not present

## 2019-03-04 DIAGNOSIS — R110 Nausea: Secondary | ICD-10-CM | POA: Diagnosis not present

## 2019-03-04 DIAGNOSIS — Z79899 Other long term (current) drug therapy: Secondary | ICD-10-CM | POA: Insufficient documentation

## 2019-03-04 DIAGNOSIS — C3491 Malignant neoplasm of unspecified part of right bronchus or lung: Secondary | ICD-10-CM | POA: Diagnosis not present

## 2019-03-04 DIAGNOSIS — Z8719 Personal history of other diseases of the digestive system: Secondary | ICD-10-CM | POA: Insufficient documentation

## 2019-03-04 DIAGNOSIS — I998 Other disorder of circulatory system: Secondary | ICD-10-CM | POA: Insufficient documentation

## 2019-03-04 LAB — CBC WITH DIFFERENTIAL (CANCER CENTER ONLY)
Abs Immature Granulocytes: 0.06 10*3/uL (ref 0.00–0.07)
Basophils Absolute: 0.1 10*3/uL (ref 0.0–0.1)
Basophils Relative: 1 %
Eosinophils Absolute: 1.1 10*3/uL — ABNORMAL HIGH (ref 0.0–0.5)
Eosinophils Relative: 8 %
HCT: 52.7 % — ABNORMAL HIGH (ref 36.0–46.0)
Hemoglobin: 16.9 g/dL — ABNORMAL HIGH (ref 12.0–15.0)
Immature Granulocytes: 1 %
Lymphocytes Relative: 10 %
Lymphs Abs: 1.3 10*3/uL (ref 0.7–4.0)
MCH: 28.7 pg (ref 26.0–34.0)
MCHC: 32.1 g/dL (ref 30.0–36.0)
MCV: 89.6 fL (ref 80.0–100.0)
Monocytes Absolute: 0.8 10*3/uL (ref 0.1–1.0)
Monocytes Relative: 6 %
Neutro Abs: 9.8 10*3/uL — ABNORMAL HIGH (ref 1.7–7.7)
Neutrophils Relative %: 74 %
Platelet Count: 323 10*3/uL (ref 150–400)
RBC: 5.88 MIL/uL — ABNORMAL HIGH (ref 3.87–5.11)
RDW: 13.9 % (ref 11.5–15.5)
WBC Count: 13.1 10*3/uL — ABNORMAL HIGH (ref 4.0–10.5)
nRBC: 0 % (ref 0.0–0.2)

## 2019-03-04 LAB — FUNGUS CULTURE WITH STAIN

## 2019-03-04 LAB — CMP (CANCER CENTER ONLY)
ALT: 43 U/L (ref 0–44)
AST: 32 U/L (ref 15–41)
Albumin: 4.1 g/dL (ref 3.5–5.0)
Alkaline Phosphatase: 131 U/L — ABNORMAL HIGH (ref 38–126)
Anion gap: 11 (ref 5–15)
BUN: 17 mg/dL (ref 8–23)
CO2: 23 mmol/L (ref 22–32)
Calcium: 9.4 mg/dL (ref 8.9–10.3)
Chloride: 105 mmol/L (ref 98–111)
Creatinine: 0.79 mg/dL (ref 0.44–1.00)
GFR, Est AFR Am: >60 mL/min (ref >60)
GFR, Estimated: >60 mL/min (ref >60)
Glucose, Bld: 128 mg/dL — ABNORMAL HIGH (ref 70–99)
Potassium: 4.2 mmol/L (ref 3.5–5.1)
Sodium: 139 mmol/L (ref 135–145)
Total Bilirubin: 0.9 mg/dL (ref 0.3–1.2)
Total Protein: 7.5 g/dL (ref 6.5–8.1)

## 2019-03-04 LAB — FUNGAL ORGANISM REFLEX

## 2019-03-04 LAB — FUNGUS CULTURE RESULT

## 2019-03-04 MED ORDER — FAMOTIDINE IN NACL 20-0.9 MG/50ML-% IV SOLN
20.0000 mg | Freq: Once | INTRAVENOUS | Status: AC
  Administered 2019-03-04: 20 mg via INTRAVENOUS

## 2019-03-04 MED ORDER — FAMOTIDINE IN NACL 20-0.9 MG/50ML-% IV SOLN
INTRAVENOUS | Status: AC
  Filled 2019-03-04: qty 50

## 2019-03-04 MED ORDER — DIPHENHYDRAMINE HCL 50 MG/ML IJ SOLN
INTRAMUSCULAR | Status: AC
  Filled 2019-03-04: qty 1

## 2019-03-04 MED ORDER — SODIUM CHLORIDE 0.9 % IV SOLN
45.0000 mg/m2 | Freq: Once | INTRAVENOUS | Status: AC
  Administered 2019-03-04: 96 mg via INTRAVENOUS
  Filled 2019-03-04: qty 16

## 2019-03-04 MED ORDER — SODIUM CHLORIDE 0.9 % IV SOLN
190.0000 mg | Freq: Once | INTRAVENOUS | Status: AC
  Administered 2019-03-04: 190 mg via INTRAVENOUS
  Filled 2019-03-04: qty 19

## 2019-03-04 MED ORDER — SODIUM CHLORIDE 0.9 % IV SOLN
Freq: Once | INTRAVENOUS | Status: AC
  Filled 2019-03-04: qty 250

## 2019-03-04 MED ORDER — DIPHENHYDRAMINE HCL 50 MG/ML IJ SOLN
50.0000 mg | Freq: Once | INTRAMUSCULAR | Status: AC
  Administered 2019-03-04: 15:00:00 50 mg via INTRAVENOUS

## 2019-03-04 MED ORDER — PALONOSETRON HCL INJECTION 0.25 MG/5ML
INTRAVENOUS | Status: AC
  Filled 2019-03-04: qty 5

## 2019-03-04 MED ORDER — SODIUM CHLORIDE 0.9 % IV SOLN: 10.0000 mg | Freq: Once | INTRAVENOUS | Status: DC

## 2019-03-04 MED ORDER — DEXAMETHASONE SODIUM PHOSPHATE 10 MG/ML IJ SOLN
INTRAMUSCULAR | Status: AC
  Filled 2019-03-04: qty 1

## 2019-03-04 MED ORDER — DEXAMETHASONE SODIUM PHOSPHATE 10 MG/ML IJ SOLN
10.0000 mg | Freq: Once | INTRAMUSCULAR | Status: AC
  Administered 2019-03-04: 10 mg via INTRAVENOUS

## 2019-03-04 MED ORDER — PALONOSETRON HCL INJECTION 0.25 MG/5ML
0.2500 mg | Freq: Once | INTRAVENOUS | Status: AC
  Administered 2019-03-04: 0.25 mg via INTRAVENOUS

## 2019-03-04 NOTE — Progress Notes (Signed)
OK to treat with lower BP per Dr Julien Nordmann

## 2019-03-04 NOTE — Progress Notes (Signed)
[facility] [phone]   [phone]  OFFICE PROGRESS NOTE  Prince Solian, MD [place] [address]  DIAGNOSIS: stage IIIc/IV (T4, N2, M0//M1a) lNon-small cell carcinoma, poorly differentiated adenocarcinoma,  presented with large right upper lobe lung mass in addition to mediastinal lymphadenopathy and suspicious left upper lobe nodule diagnosed in December 2020.  Molecular studies by [facility]:  [id number] 0.2% Binimetinib  [id number] 4.8% None (VUS) No (VUS)  [id number] 0.2% None (VUS) No (VUS)  PRIOR THERAPY: None.  CURRENT THERAPY: Concurrent chemoradiation with weekly carboplatin for AUC of 2 and paclitaxel 45 mg/M2.  First dose of March 04, 2019  INTERVAL HISTORY: Ashley Parker 86 y.o. female returns to the clinic today for follow-up visit.  The patient is feeling fine today with no concerning complaints.  She is a little bit anxious about starting her treatment today.  Her blood pressure was elevated today.  The patient denied having any chest pain, shortness of breath, cough or hemoptysis.  She denied having any fever or chills.  She has no nausea, vomiting, diarrhea or constipation.  She is here today to start the first dose of her concurrent chemoradiation.  MEDICAL HISTORY: Past Medical History:  Diagnosis Date  . Arthritis   . Basal cell carcinoma (BCC) of skin of face 2016   removed in 2016  . COPD (chronic obstructive pulmonary disease) (HCC)    Mild  . Crohn's ileitis ([facility])    Mild, mostly asymptomatic, not on therapy  . Fundic gland polyposis of stomach   . GERD (gastroesophageal reflux disease)   . Glaucoma   . Hemorrhoids    Internal and External  . History of hiatal hernia   . Hypertension   . Macular degeneration   . Obesity, unspecified   . Osteoporosis   . Pelvic fracture ([place] [facility]) 2007  . Stricture of esophagus    distal  . Unspecified vitamin D deficiency     ALLERGIES:  is  allergic to wasp venom; biaxin [clarithromycin]; and brimonidine.  MEDICATIONS:  Current Outpatient Medications  Medication Sig Dispense Refill  . albuterol (VENTOLIN HFA) 108 (90 Base) MCG/ACT inhaler Inhale 1 puff into the lungs as needed for wheezing or shortness of breath.    . Ascorbic Acid (VITAMIN C) 500 MG CHEW Chew 500 mg by mouth daily.    . B Complex-C (SUPER B COMPLEX PO) Take 1 tablet by mouth daily.    . Calcium Carb-Cholecalciferol (CALCIUM 600+D3 PO) Take 1-2 tablets by mouth See admin instructions. Take 1 tablet in the morning & 2 tablet at night    . calcium elemental as carbonate (BARIATRIC TUMS ULTRA) 400 MG chewable tablet Chew 2,000 mg by mouth at bedtime.    . Cholecalciferol (VITAMIN D3) 2000 units TABS Take 2,000 Units by mouth daily.     . diclofenac Sodium (VOLTAREN) 1 % GEL Apply 1 application topically 4 (four) times daily as needed (pain.).     [name] dorzolamide (TRUSOPT) 2 % ophthalmic solution Place 1 drop into the left eye 2 (two) times daily.     [name] EPINEPHrine 0.3 mg/0.3 mL IJ SOAJ injection Inject 0.3 mg into the muscle as needed (for allergic reaction).    . Flaxseed, Linseed, (FLAXSEED OIL PO) Take 1 tablet by mouth daily.    . fluticasone (FLONASE) 50 MCG/ACT nasal spray Place 1 spray into both nostrils daily.     . irbesartan (AVAPRO) 300 MG tablet Take 300 mg by  mouth daily.     [name] LUMIGAN 0.01 % SOLN Place 1 drop into the right eye at bedtime.   4  . Magnesium 250 MG TABS Take 250 mg by mouth daily.    . Multiple Vitamins-Minerals (PRESERVISION AREDS 2 PO) Take 1 tablet by mouth 2 (two) times daily.    . Omega-3 Fatty Acids (FISH OIL) 1000 MG CAPS Take 1,000 mg by mouth daily.    Vladimir Faster Glycol-Propyl Glycol (SYSTANE) 0.4-0.3 % SOLN Place 1 drop into both eyes 3 (three) times daily as needed (for tired/dry eyes.).     [name] timolol (TIMOPTIC) 0.5 % ophthalmic solution Place 1 drop into the left eye 2 (two) times daily.    . Tiotropium Bromide-Olodaterol  (STIOLTO RESPIMAT) 2.5-2.5 MCG/ACT AERS Inhale 2 puffs into the lungs daily. 2 g 0  . TURMERIC PO Take 1,500 mg by mouth daily.    . vitamin B-12 (CYANOCOBALAMIN) 1000 MCG tablet Take 1,000 mcg by mouth daily.    . vitamin E 400 UNIT capsule Take 400 Units by mouth daily. (180 mg)    . Zinc 30 MG TABS Take 30 mg by mouth daily.    . prochlorperazine (COMPAZINE) 10 MG tablet Take 1 tablet (10 mg total) by mouth every 6 (six) hours as needed for nausea or vomiting. (Patient not taking: Reported on 02/26/2019) 30 tablet 0   No current facility-administered medications for this visit.    SURGICAL HISTORY:  Past Surgical History:  Procedure Laterality Date  . APPENDECTOMY    . BACK SURGERY    . CATARACT EXTRACTION    . COLONOSCOPY  07/27/2001, 07/08/2011   Multiple  . ESOPHAGEAL DILATION  2013  . ESOPHAGOGASTRODUODENOSCOPY     multiple  . LAPAROSCOPIC APPENDECTOMY N/A 02/19/2014   Procedure: APPENDECTOMY LAPAROSCOPIC;  Surgeon: Erroll Luna, MD;  Location: [facility];  Service: General;  Laterality: N/A;  . MINOR PLACEMENT OF FIDUCIAL  08/23/2017   Procedure: MINOR PLACEMENT OF FIDUCIAL - Left Upper Lobe Lung x2, Right Upper Lobe Lung x3;  Surgeon: Collene Gobble, MD;  Location: [facility];  Service: Thoracic;;  . TONSILLECTOMY AND ADENOIDECTOMY     and adenoids   . TOTAL KNEE ARTHROPLASTY Left 2007  . VIDEO BRONCHOSCOPY WITH ENDOBRONCHIAL NAVIGATION N/A 08/23/2017   Procedure: VIDEO BRONCHOSCOPY WITH ENDOBRONCHIAL NAVIGATION;  Surgeon: Collene Gobble, MD;  Location: [facility];  Service: Thoracic;  Laterality: N/A;  . VIDEO BRONCHOSCOPY WITH ENDOBRONCHIAL NAVIGATION N/A 01/31/2019   Procedure: VIDEO BRONCHOSCOPY WITH ENDOBRONCHIAL NAVIGATION;  Surgeon: Garner Nash, DO;  Location: [facility];  Service: Thoracic;  Laterality: N/A;  . VIDEO BRONCHOSCOPY WITH ENDOBRONCHIAL ULTRASOUND N/A 01/31/2019   Procedure: VIDEO BRONCHOSCOPY WITH ENDOBRONCHIAL ULTRASOUND;  Surgeon: Garner Nash, DO;  Location: [facility];  Service: Thoracic;  Laterality: N/A;  . WRIST FRACTURE SURGERY Left 2018   with screws    REVIEW OF SYSTEMS:  A comprehensive review of systems was negative except for: Behavioral/Psych: positive for anxiety   PHYSICAL EXAMINATION: General appearance: alert, cooperative and no distress Head: Normocephalic, without obvious abnormality, atraumatic Neck: no adenopathy, no JVD, supple, symmetrical, trachea midline and thyroid not enlarged, symmetric, no tenderness/mass/nodules Lymph nodes: Cervical, supraclavicular, and axillary nodes normal. Resp: clear to auscultation bilaterally Back: symmetric, no curvature. ROM normal. No CVA tenderness. Cardio: regular rate and rhythm, S1, S2 normal, no murmur, click, rub or gallop GI: soft, non-tender; bowel sounds normal; no masses,  no organomegaly Extremities: extremities normal, atraumatic, no cyanosis or edema  ECOG PERFORMANCE STATUS: 1 - Symptomatic but completely ambulatory  Blood pressure (!) 153/68, pulse 92, temperature 98.8 F (37.1 C), temperature source Temporal, resp. rate 19, height 5' 5"  (1.651 m), weight 222 lb 4.8 oz (100.8 kg), SpO2 97 %.  LABORATORY DATA: Lab Results  Component Value Date   WBC 13.1 (H) 03/04/2019   HGB 16.9 (H) 03/04/2019   HCT 52.7 (H) 03/04/2019   MCV 89.6 03/04/2019   PLT 323 03/04/2019      Chemistry      Component Value Date/Time   NA 140 02/05/2019 1336   K 4.5 02/05/2019 1336   CL 104 02/05/2019 1336   CO2 24 02/05/2019 1336   BUN 22 02/05/2019 1336   CREATININE 0.97 02/05/2019 1336      Component Value Date/Time   CALCIUM 10.0 02/05/2019 1336   ALKPHOS 120 02/05/2019 1336   AST 53 (H) 02/05/2019 1336   ALT 80 (H) 02/05/2019 1336   BILITOT 1.0 02/05/2019 1336       RADIOGRAPHIC STUDIES: MR BRAIN W WO CONTRAST  Result Date: 02/14/2019 CLINICAL DATA:  Non-small cell lung cancer staging. EXAM: MRI HEAD WITHOUT AND WITH CONTRAST TECHNIQUE: Multiplanar, multiecho pulse sequences  of the brain and surrounding structures were obtained without and with intravenous contrast. CONTRAST:  15m GADAVIST GADOBUTROL 1 MMOL/ML IV SOLN COMPARISON:  None. FINDINGS: Brain: There is no evidence of acute infarct, intracranial hemorrhage, mass, midline shift, or extra-axial fluid collection. Patchy T2 hyperintensities in the cerebral white matter bilaterally are nonspecific but compatible with chronic small vessel ischemic disease, relatively mild for age. No abnormal enhancement is identified. There is moderate cerebral atrophy. Vascular: Major intracranial vascular flow voids are preserved. Skull and upper cervical spine: Unremarkable bone marrow signal. Sinuses/Orbits: Bilateral cataract extraction. Paranasal sinuses and mastoid air cells are clear. Other: None. IMPRESSION: 1. No evidence of intracranial metastases. 2. Mild chronic small vessel ischemic disease. Electronically Signed   By: ALogan BoresM.D.   On: 02/14/2019 08:20    ASSESSMENT AND PLAN: This is a very pleasant 86years old white female with a stage IIIc/IV non-small cell lung cancer, poorly differentiated adenocarcinoma presented with right upper lobe lung mass in addition to mediastinal lymphadenopathy diagnosed in December 2020. Molecular studies by [facility] showed no actionable mutations. The patient is here today to start the first cycle of concurrent chemoradiation with weekly carboplatin and paclitaxel.  She is feeling fine and I recommended for her to proceed with the treatment today as planned. For hypertension she is very anxious about starting the treatment today.  I recommended for her to take her blood pressure medications as prescribed and to monitor it closely at home. The patient will come back for follow-up visit in 2 weeks for evaluation and management of any adverse effect of her treatment. She was advised to call immediately if she has any concerning symptoms in the interval. The patient voices  understanding of current disease status and treatment options and is in agreement with the current care plan.  All questions were answered. The patient knows to call the clinic with any problems, questions or concerns. We can certainly see the patient much sooner if necessary. The total time spent in the appointment was 15 minutes.  Disclaimer: This note was dictated with voice recognition software. Similar sounding words can inadvertently be transcribed and may not be corrected upon review.

## 2019-03-04 NOTE — Patient Instructions (Signed)
[facility] Discharge Instructions for Patients Receiving Chemotherapy  Today you received the following chemotherapy agents: Taxol and Carboplatin.  To help prevent nausea and vomiting after your treatment, we encourage you to take your nausea medication as directed.   If you develop nausea and vomiting that is not controlled by your nausea medication, call the clinic.   BELOW ARE SYMPTOMS THAT SHOULD BE REPORTED IMMEDIATELY:  *FEVER GREATER THAN 100.5 F  *CHILLS WITH OR WITHOUT FEVER  NAUSEA AND VOMITING THAT IS NOT CONTROLLED WITH YOUR NAUSEA MEDICATION  *UNUSUAL SHORTNESS OF BREATH  *UNUSUAL BRUISING OR BLEEDING  TENDERNESS IN MOUTH AND THROAT WITH OR WITHOUT PRESENCE OF ULCERS  *URINARY PROBLEMS  *BOWEL PROBLEMS  UNUSUAL RASH Items with * indicate a potential emergency and should be followed up as soon as possible.  Feel free to call the clinic should you have any questions or concerns. The clinic phone number is [phone].  Please show the [place] at check-in to the Emergency Department and triage nurse.  Paclitaxel injection What is this medicine? PACLITAXEL (PAK li TAX el) is a chemotherapy drug. It targets fast dividing cells, like cancer cells, and causes these cells to die. This medicine is used to treat ovarian cancer, breast cancer, lung cancer, Kaposi's sarcoma, and other cancers. This medicine may be used for other purposes; ask your health care provider or pharmacist if you have questions. COMMON BRAND NAME(S): Onxol, Taxol What should I tell my health care provider before I take this medicine? They need to know if you have any of these conditions:  history of irregular heartbeat  liver disease  low blood counts, like low white cell, platelet, or red cell counts  lung or breathing disease, like asthma  tingling of the fingers or toes, or other nerve disorder  an unusual or allergic reaction to paclitaxel, alcohol,  polyoxyethylated castor oil, other chemotherapy, other medicines, foods, dyes, or preservatives  pregnant or trying to get pregnant  breast-feeding How should I use this medicine? This drug is given as an infusion into a vein. It is administered in a hospital or clinic by a specially trained health care professional. Talk to your pediatrician regarding the use of this medicine in children. Special care may be needed. Overdosage: If you think you have taken too much of this medicine contact a poison control center or emergency room at once. NOTE: This medicine is only for you. Do not share this medicine with others. What if I miss a dose? It is important not to miss your dose. Call your doctor or health care professional if you are unable to keep an appointment. What may interact with this medicine? Do not take this medicine with any of the following medications:  disulfiram  metronidazole This medicine may also interact with the following medications:  antiviral medicines for hepatitis, HIV or AIDS  certain antibiotics like erythromycin and clarithromycin  certain medicines for fungal infections like ketoconazole and itraconazole  certain medicines for seizures like carbamazepine, phenobarbital, phenytoin  gemfibrozil  nefazodone  rifampin  St. John's wort This list may not describe all possible interactions. Give your health care provider a list of all the medicines, herbs, non-prescription drugs, or dietary supplements you use. Also tell them if you smoke, drink alcohol, or use illegal drugs. Some items may interact with your medicine. What should I watch for while using this medicine? Your condition will be monitored carefully while you are receiving this medicine. You will need important  blood work done while you are taking this medicine. This medicine can cause serious allergic reactions. To reduce your risk you will need to take other medicine(s) before treatment with this  medicine. If you experience allergic reactions like skin rash, itching or hives, swelling of the face, lips, or tongue, tell your doctor or health care professional right away. In some cases, you may be given additional medicines to help with side effects. Follow all directions for their use. This drug may make you feel generally unwell. This is not uncommon, as chemotherapy can affect healthy cells as well as cancer cells. Report any side effects. Continue your course of treatment even though you feel ill unless your doctor tells you to stop. Call your doctor or health care professional for advice if you get a fever, chills or sore throat, or other symptoms of a cold or flu. Do not treat yourself. This drug decreases your body's ability to fight infections. Try to avoid being around people who are sick. This medicine may increase your risk to bruise or bleed. Call your doctor or health care professional if you notice any unusual bleeding. Be careful brushing and flossing your teeth or using a toothpick because you may get an infection or bleed more easily. If you have any dental work done, tell your dentist you are receiving this medicine. Avoid taking products that contain aspirin, acetaminophen, ibuprofen, naproxen, or ketoprofen unless instructed by your doctor. These medicines may hide a fever. Do not become pregnant while taking this medicine. Women should inform their doctor if they wish to become pregnant or think they might be pregnant. There is a potential for serious side effects to an unborn child. Talk to your health care professional or pharmacist for more information. Do not breast-feed an infant while taking this medicine. Men are advised not to father a child while receiving this medicine. This product may contain alcohol. Ask your pharmacist or healthcare provider if this medicine contains alcohol. Be sure to tell all healthcare providers you are taking this medicine. Certain medicines,  like metronidazole and disulfiram, can cause an unpleasant reaction when taken with alcohol. The reaction includes flushing, headache, nausea, vomiting, sweating, and increased thirst. The reaction can last from 30 minutes to several hours. What side effects may I notice from receiving this medicine? Side effects that you should report to your doctor or health care professional as soon as possible:  allergic reactions like skin rash, itching or hives, swelling of the face, lips, or tongue  breathing problems  changes in vision  fast, irregular heartbeat  high or low blood pressure  mouth sores  pain, tingling, numbness in the hands or feet  signs of decreased platelets or bleeding - bruising, pinpoint red spots on the skin, black, tarry stools, blood in the urine  signs of decreased red blood cells - unusually weak or tired, feeling faint or lightheaded, falls  signs of infection - fever or chills, cough, sore throat, pain or difficulty passing urine  signs and symptoms of liver injury like dark yellow or brown urine; general ill feeling or flu-like symptoms; light-colored stools; loss of appetite; nausea; right upper belly pain; unusually weak or tired; yellowing of the eyes or skin  swelling of the ankles, feet, hands  unusually slow heartbeat Side effects that usually do not require medical attention (report to your doctor or health care professional if they continue or are bothersome):  diarrhea  hair loss  loss of appetite  muscle or joint  pain  nausea, vomiting  pain, redness, or irritation at site where injected  tiredness This list may not describe all possible side effects. Call your doctor for medical advice about side effects. You may report side effects to FDA at 1-800-FDA-1088. Where should I keep my medicine? This drug is given in a hospital or clinic and will not be stored at home. NOTE: This sheet is a summary. It may not cover all possible information.  If you have questions about this medicine, talk to your doctor, pharmacist, or health care provider.  2020 Elsevier/Gold Standard (2016-10-18 13:14:55) Carboplatin injection What is this medicine? CARBOPLATIN (KAR boe pla tin) is a chemotherapy drug. It targets fast dividing cells, like cancer cells, and causes these cells to die. This medicine is used to treat ovarian cancer and many other cancers. This medicine may be used for other purposes; ask your health care provider or pharmacist if you have questions. COMMON BRAND NAME(S): Paraplatin What should I tell my health care provider before I take this medicine? They need to know if you have any of these conditions:  blood disorders  hearing problems  kidney disease  recent or ongoing radiation therapy  an unusual or allergic reaction to carboplatin, cisplatin, other chemotherapy, other medicines, foods, dyes, or preservatives  pregnant or trying to get pregnant  breast-feeding How should I use this medicine? This drug is usually given as an infusion into a vein. It is administered in a hospital or clinic by a specially trained health care professional. Talk to your pediatrician regarding the use of this medicine in children. Special care may be needed. Overdosage: If you think you have taken too much of this medicine contact a poison control center or emergency room at once. NOTE: This medicine is only for you. Do not share this medicine with others. What if I miss a dose? It is important not to miss a dose. Call your doctor or health care professional if you are unable to keep an appointment. What may interact with this medicine?  medicines for seizures  medicines to increase blood counts like filgrastim, pegfilgrastim, sargramostim  some antibiotics like amikacin, gentamicin, neomycin, streptomycin, tobramycin  vaccines Talk to your doctor or health care professional before taking any of these  medicines:  acetaminophen  aspirin  ibuprofen  ketoprofen  naproxen This list may not describe all possible interactions. Give your health care provider a list of all the medicines, herbs, non-prescription drugs, or dietary supplements you use. Also tell them if you smoke, drink alcohol, or use illegal drugs. Some items may interact with your medicine. What should I watch for while using this medicine? Your condition will be monitored carefully while you are receiving this medicine. You will need important blood work done while you are taking this medicine. This drug may make you feel generally unwell. This is not uncommon, as chemotherapy can affect healthy cells as well as cancer cells. Report any side effects. Continue your course of treatment even though you feel ill unless your doctor tells you to stop. In some cases, you may be given additional medicines to help with side effects. Follow all directions for their use. Call your doctor or health care professional for advice if you get a fever, chills or sore throat, or other symptoms of a cold or flu. Do not treat yourself. This drug decreases your body's ability to fight infections. Try to avoid being around people who are sick. This medicine may increase your risk to bruise or  bleed. Call your doctor or health care professional if you notice any unusual bleeding. Be careful brushing and flossing your teeth or using a toothpick because you may get an infection or bleed more easily. If you have any dental work done, tell your dentist you are receiving this medicine. Avoid taking products that contain aspirin, acetaminophen, ibuprofen, naproxen, or ketoprofen unless instructed by your doctor. These medicines may hide a fever. Do not become pregnant while taking this medicine. Women should inform their doctor if they wish to become pregnant or think they might be pregnant. There is a potential for serious side effects to an unborn child. Talk  to your health care professional or pharmacist for more information. Do not breast-feed an infant while taking this medicine. What side effects may I notice from receiving this medicine? Side effects that you should report to your doctor or health care professional as soon as possible:  allergic reactions like skin rash, itching or hives, swelling of the face, lips, or tongue  signs of infection - fever or chills, cough, sore throat, pain or difficulty passing urine  signs of decreased platelets or bleeding - bruising, pinpoint red spots on the skin, black, tarry stools, nosebleeds  signs of decreased red blood cells - unusually weak or tired, fainting spells, lightheadedness  breathing problems  changes in hearing  changes in vision  chest pain  high blood pressure  low blood counts - This drug may decrease the number of white blood cells, red blood cells and platelets. You may be at increased risk for infections and bleeding.  nausea and vomiting  pain, swelling, redness or irritation at the injection site  pain, tingling, numbness in the hands or feet  problems with balance, talking, walking  trouble passing urine or change in the amount of urine Side effects that usually do not require medical attention (report to your doctor or health care professional if they continue or are bothersome):  hair loss  loss of appetite  metallic taste in the mouth or changes in taste This list may not describe all possible side effects. Call your doctor for medical advice about side effects. You may report side effects to FDA at 1-800-FDA-1088. Where should I keep my medicine? This drug is given in a hospital or clinic and will not be stored at home. NOTE: This sheet is a summary. It may not cover all possible information. If you have questions about this medicine, talk to your doctor, pharmacist, or health care provider.  2020 Elsevier/Gold Standard (2007-05-22 14:38:05)

## 2019-03-05 ENCOUNTER — Telehealth: Payer: Self-pay | Admitting: *Deleted

## 2019-03-05 DIAGNOSIS — R590 Localized enlarged lymph nodes: Secondary | ICD-10-CM | POA: Diagnosis not present

## 2019-03-05 DIAGNOSIS — C3491 Malignant neoplasm of unspecified part of right bronchus or lung: Secondary | ICD-10-CM | POA: Diagnosis not present

## 2019-03-05 DIAGNOSIS — C3411 Malignant neoplasm of upper lobe, right bronchus or lung: Secondary | ICD-10-CM | POA: Insufficient documentation

## 2019-03-05 DIAGNOSIS — R911 Solitary pulmonary nodule: Secondary | ICD-10-CM | POA: Insufficient documentation

## 2019-03-05 NOTE — Telephone Encounter (Signed)
Attempted to call pt at home & mobile # & no answer.  Left message to call back to see how she did with her treatment yest.

## 2019-03-05 NOTE — Telephone Encounter (Signed)
-----   Message from Zola Button, RN sent at 03/04/2019  6:06 PM EST ----- Regarding: Dr. Julien Nordmann; First Time F/U Call Patient received first time Taxol/Carboplatin on 03/03/18. BP dropped from Benadryl but came back up, otherwise tolerated well. Stating radiation treatment later this week. Thank you!

## 2019-03-05 NOTE — Telephone Encounter (Signed)
Pt called back & states she was feeling fine.  She had heart burn last night b/c she forgot to take her tums but got up & took & felt better & slept.  She denies any other symptoms & knows how to reach [place] if she needs to.

## 2019-03-06 ENCOUNTER — Other Ambulatory Visit: Payer: Self-pay

## 2019-03-06 ENCOUNTER — Ambulatory Visit
Admission: RE | Admit: 2019-03-06 | Discharge: 2019-03-06 | Disposition: A | Discharge status: Home / Self Care | Payer: PPO | Source: Ambulatory Visit | Attending: Radiation Oncology | Admitting: Radiation Oncology

## 2019-03-06 ENCOUNTER — Telehealth: Payer: Self-pay | Admitting: Internal Medicine

## 2019-03-06 DIAGNOSIS — C3491 Malignant neoplasm of unspecified part of right bronchus or lung: Secondary | ICD-10-CM

## 2019-03-06 DIAGNOSIS — C3411 Malignant neoplasm of upper lobe, right bronchus or lung: Secondary | ICD-10-CM | POA: Diagnosis not present

## 2019-03-06 NOTE — Telephone Encounter (Signed)
Scheduled per los. Called and left msg.maied printout

## 2019-03-07 ENCOUNTER — Telehealth: Payer: Self-pay

## 2019-03-07 ENCOUNTER — Ambulatory Visit
Admission: RE | Admit: 2019-03-07 | Discharge: 2019-03-07 | Disposition: A | Discharge status: Home / Self Care | Payer: PPO | Source: Ambulatory Visit | Attending: Radiation Oncology | Admitting: Radiation Oncology

## 2019-03-07 ENCOUNTER — Other Ambulatory Visit: Payer: Self-pay

## 2019-03-07 DIAGNOSIS — C3491 Malignant neoplasm of unspecified part of right bronchus or lung: Secondary | ICD-10-CM | POA: Diagnosis not present

## 2019-03-07 DIAGNOSIS — C3411 Malignant neoplasm of upper lobe, right bronchus or lung: Secondary | ICD-10-CM | POA: Diagnosis not present

## 2019-03-07 NOTE — Telephone Encounter (Signed)
Contacted pt to convey that transportation would not be provided tomorrow due to inclement weather. Inquired if pt would like to cancel treatment for tomorrow or find her own transportation. Pt opted to cancel. Dr. Sondra Come and [place] [facility] aware. Loma Sousa, RN BSN

## 2019-03-08 ENCOUNTER — Ambulatory Visit: Payer: PPO

## 2019-03-11 ENCOUNTER — Ambulatory Visit
Admission: RE | Admit: 2019-03-11 | Discharge: 2019-03-11 | Disposition: A | Discharge status: Home / Self Care | Payer: PPO | Source: Ambulatory Visit | Attending: Radiation Oncology | Admitting: Radiation Oncology

## 2019-03-11 ENCOUNTER — Inpatient Hospital Stay: Payer: PPO

## 2019-03-11 ENCOUNTER — Inpatient Hospital Stay: Payer: PPO | Admitting: Nutrition

## 2019-03-11 ENCOUNTER — Other Ambulatory Visit: Payer: Self-pay

## 2019-03-11 VITALS — BP 132/67 | HR 86 | Temp 97.9°F | Resp 18 | Wt 223.0 lb

## 2019-03-11 DIAGNOSIS — C3491 Malignant neoplasm of unspecified part of right bronchus or lung: Secondary | ICD-10-CM | POA: Diagnosis not present

## 2019-03-11 DIAGNOSIS — Z5111 Encounter for antineoplastic chemotherapy: Secondary | ICD-10-CM | POA: Diagnosis not present

## 2019-03-11 DIAGNOSIS — C3411 Malignant neoplasm of upper lobe, right bronchus or lung: Secondary | ICD-10-CM | POA: Diagnosis not present

## 2019-03-11 LAB — CMP (CANCER CENTER ONLY)
ALT: 45 U/L — ABNORMAL HIGH (ref 0–44)
AST: 32 U/L (ref 15–41)
Albumin: 3.6 g/dL (ref 3.5–5.0)
Alkaline Phosphatase: 137 U/L — ABNORMAL HIGH (ref 38–126)
Anion gap: 12 (ref 5–15)
BUN: 20 mg/dL (ref 8–23)
CO2: 22 mmol/L (ref 22–32)
Calcium: 9.5 mg/dL (ref 8.9–10.3)
Chloride: 104 mmol/L (ref 98–111)
Creatinine: 0.76 mg/dL (ref 0.44–1.00)
GFR, Est AFR Am: >60 mL/min (ref >60)
GFR, Estimated: >60 mL/min (ref >60)
Glucose, Bld: 135 mg/dL — ABNORMAL HIGH (ref 70–99)
Potassium: 4.5 mmol/L (ref 3.5–5.1)
Sodium: 138 mmol/L (ref 135–145)
Total Bilirubin: 1 mg/dL (ref 0.3–1.2)
Total Protein: 7 g/dL (ref 6.5–8.1)

## 2019-03-11 LAB — CBC WITH DIFFERENTIAL (CANCER CENTER ONLY)
Abs Immature Granulocytes: 0.12 10*3/uL — ABNORMAL HIGH (ref 0.00–0.07)
Basophils Absolute: 0.1 10*3/uL (ref 0.0–0.1)
Basophils Relative: 1 %
Eosinophils Absolute: 0.5 10*3/uL (ref 0.0–0.5)
Eosinophils Relative: 6 %
HCT: 47.9 % — ABNORMAL HIGH (ref 36.0–46.0)
Hemoglobin: 15.7 g/dL — ABNORMAL HIGH (ref 12.0–15.0)
Immature Granulocytes: 1 %
Lymphocytes Relative: 9 %
Lymphs Abs: 0.8 10*3/uL (ref 0.7–4.0)
MCH: 28.5 pg (ref 26.0–34.0)
MCHC: 32.8 g/dL (ref 30.0–36.0)
MCV: 86.9 fL (ref 80.0–100.0)
Monocytes Absolute: 0.4 10*3/uL (ref 0.1–1.0)
Monocytes Relative: 4 %
Neutro Abs: 6.9 10*3/uL (ref 1.7–7.7)
Neutrophils Relative %: 79 %
Platelet Count: 390 10*3/uL (ref 150–400)
RBC: 5.51 MIL/uL — ABNORMAL HIGH (ref 3.87–5.11)
RDW: 13.5 % (ref 11.5–15.5)
WBC Count: 8.8 10*3/uL (ref 4.0–10.5)
nRBC: 0 % (ref 0.0–0.2)

## 2019-03-11 MED ORDER — DEXAMETHASONE SODIUM PHOSPHATE 10 MG/ML IJ SOLN
10.0000 mg | Freq: Once | INTRAMUSCULAR | Status: AC
  Administered 2019-03-11: 10 mg via INTRAVENOUS

## 2019-03-11 MED ORDER — PALONOSETRON HCL INJECTION 0.25 MG/5ML
INTRAVENOUS | Status: AC
  Filled 2019-03-11: qty 5

## 2019-03-11 MED ORDER — SODIUM CHLORIDE 0.9 % IV SOLN
185.0000 mg | Freq: Once | INTRAVENOUS | Status: AC
  Administered 2019-03-11: 190 mg via INTRAVENOUS
  Filled 2019-03-11: qty 19

## 2019-03-11 MED ORDER — DIPHENHYDRAMINE HCL 50 MG/ML IJ SOLN
25.0000 mg | Freq: Once | INTRAMUSCULAR | Status: AC
  Administered 2019-03-11: 25 mg via INTRAVENOUS

## 2019-03-11 MED ORDER — FAMOTIDINE IN NACL 20-0.9 MG/50ML-% IV SOLN
20.0000 mg | Freq: Once | INTRAVENOUS | Status: AC
  Administered 2019-03-11: 20 mg via INTRAVENOUS

## 2019-03-11 MED ORDER — SODIUM CHLORIDE 0.9 % IV SOLN
45.0000 mg/m2 | Freq: Once | INTRAVENOUS | Status: AC
  Administered 2019-03-11: 96 mg via INTRAVENOUS
  Filled 2019-03-11: qty 16

## 2019-03-11 MED ORDER — FAMOTIDINE IN NACL 20-0.9 MG/50ML-% IV SOLN
INTRAVENOUS | Status: AC
  Filled 2019-03-11: qty 50

## 2019-03-11 MED ORDER — PALONOSETRON HCL INJECTION 0.25 MG/5ML
0.2500 mg | Freq: Once | INTRAVENOUS | Status: AC
  Administered 2019-03-11: 0.25 mg via INTRAVENOUS

## 2019-03-11 MED ORDER — SODIUM CHLORIDE 0.9 % IV SOLN
Freq: Once | INTRAVENOUS | Status: AC
  Filled 2019-03-11: qty 250

## 2019-03-11 MED ORDER — DEXAMETHASONE SODIUM PHOSPHATE 10 MG/ML IJ SOLN
INTRAMUSCULAR | Status: AC
  Filled 2019-03-11: qty 1

## 2019-03-11 MED ORDER — DIPHENHYDRAMINE HCL 50 MG/ML IJ SOLN
INTRAMUSCULAR | Status: AC
  Filled 2019-03-11: qty 1

## 2019-03-11 NOTE — Progress Notes (Signed)
Patient was identified on the MST to be at risk for malnutrition secondary to weight loss and poor appetite.  86 year old female diagnosed with lung cancer receiving concurrent chemoradiation therapy.  She is a patient of Dr. Julien Nordmann.  Past medical history includes COPD, Crohn's disease, GERD, hypertension, obesity, stricture of the esophagus, and vitamin D deficiency.  Medications include vitamin C, B complex, calcium with vitamin D, vitamin D3, flaxseed oil, magnesium, multivitamin, vitamin B12, vitamin D, zinc, and Compazine.  Labs were reviewed.  Height: 5 feet 5 inches. Weight: 223 pounds January 11. Usual body weight: 232 pounds in December. BMI: 37.11.  Patient reports she is pleased with weight loss even though she did not try to lose weight. She had some vomiting this morning.  She denies nausea. If she is feeling nauseated she does take her nausea medication. She has not consumed any type of oral nutrition supplements. She denies other nutrition impact symptoms.  Nutrition diagnosis: Inadequate oral intake related to lung cancer and associated treatments as evidenced by 4% weight loss over 1 month.  Intervention: Patient was educated to consume small frequent meals and snacks with adequate calories and protein for weight maintenance. Discouraged patient from trying to lose weight.  Explained role of macronutrients for immune function and muscle maintenance. Brief strategies provided.  Fact sheets were given.  Contact information provided.  Questions were answered.  Monitoring, evaluation, goals: Patient will tolerate adequate calories and protein to minimize weight loss.  Next visit: Patient will contact me for questions or concerns.

## 2019-03-11 NOTE — Patient Instructions (Signed)
   [facility] Discharge Instructions for Patients Receiving Chemotherapy  Today you received the following chemotherapy agents Taxol and Carboplatin   To help prevent nausea and vomiting after your treatment, we encourage you to take your nausea medication as directed.    If you develop nausea and vomiting that is not controlled by your nausea medication, call the clinic.   BELOW ARE SYMPTOMS THAT SHOULD BE REPORTED IMMEDIATELY:  *FEVER GREATER THAN 100.5 F  *CHILLS WITH OR WITHOUT FEVER  NAUSEA AND VOMITING THAT IS NOT CONTROLLED WITH YOUR NAUSEA MEDICATION  *UNUSUAL SHORTNESS OF BREATH  *UNUSUAL BRUISING OR BLEEDING  TENDERNESS IN MOUTH AND THROAT WITH OR WITHOUT PRESENCE OF ULCERS  *URINARY PROBLEMS  *BOWEL PROBLEMS  UNUSUAL RASH Items with * indicate a potential emergency and should be followed up as soon as possible.  Feel free to call the clinic should you have any questions or concerns. The clinic phone number is [phone].  Please show the CHEMO ALERT CARD at check-in to the Emergency Department and triage nurse.   

## 2019-03-12 ENCOUNTER — Other Ambulatory Visit: Payer: Self-pay

## 2019-03-12 ENCOUNTER — Ambulatory Visit
Admission: RE | Admit: 2019-03-12 | Discharge: 2019-03-12 | Disposition: A | Discharge status: Home / Self Care | Payer: PPO | Source: Ambulatory Visit | Attending: Radiation Oncology | Admitting: Radiation Oncology

## 2019-03-12 DIAGNOSIS — C3411 Malignant neoplasm of upper lobe, right bronchus or lung: Secondary | ICD-10-CM | POA: Diagnosis not present

## 2019-03-12 DIAGNOSIS — C3491 Malignant neoplasm of unspecified part of right bronchus or lung: Secondary | ICD-10-CM | POA: Diagnosis not present

## 2019-03-12 MED ORDER — SONAFINE EX EMUL
1.0000 "application " | Freq: Two times a day (BID) | CUTANEOUS | Status: DC
  Administered 2019-03-12: 1 via TOPICAL

## 2019-03-13 ENCOUNTER — Ambulatory Visit
Admission: RE | Admit: 2019-03-13 | Discharge: 2019-03-13 | Disposition: A | Discharge status: Home / Self Care | Payer: PPO | Source: Ambulatory Visit | Attending: Radiation Oncology | Admitting: Radiation Oncology

## 2019-03-13 ENCOUNTER — Telehealth: Payer: Self-pay | Admitting: *Deleted

## 2019-03-13 ENCOUNTER — Other Ambulatory Visit: Payer: Self-pay

## 2019-03-13 DIAGNOSIS — C3491 Malignant neoplasm of unspecified part of right bronchus or lung: Secondary | ICD-10-CM | POA: Diagnosis not present

## 2019-03-13 DIAGNOSIS — C3411 Malignant neoplasm of upper lobe, right bronchus or lung: Secondary | ICD-10-CM | POA: Diagnosis not present

## 2019-03-13 NOTE — Telephone Encounter (Signed)
Oncology Nurse Navigator Documentation  Oncology Nurse Navigator Flowsheets 03/13/2019  Abnormal Finding Date 09/21/2018  Confirmed Diagnosis Date 01/31/2019  Diagnosis Status Confirmed Diagnosis Complete  Planned Course of Treatment Chemo/Radiation Concurrent  Phase of Treatment Chemo/Radiation Concurrent  Chemo/Radiation Concurrent Actual Start Date: 03/04/2019  Navigator Follow Up Date: 03/18/2019  Navigator Follow Up Reason: Follow-up Appointment  Navigator Location [facility]  Navigator Encounter Type Telephone  Telephone Outgoing Call/I called patient to follow up on her treatment plan and see if she had questions.  I was unable to reach but did leave vm message with my name and phone number to call.   Treatment Initiated Date 03/04/2019  Patient Visit Type -  Treatment Phase Treatment  Barriers/Navigation Needs Education  Education Other  Interventions Education  Acuity Level 2-Minimal Needs (1-2 Barriers Identified)  Coordination of Care -  Education Method Verbal  Support Groups/Services -  Time Spent with Patient 15

## 2019-03-14 ENCOUNTER — Ambulatory Visit
Admission: RE | Admit: 2019-03-14 | Discharge: 2019-03-14 | Disposition: A | Discharge status: Home / Self Care | Payer: PPO | Source: Ambulatory Visit | Attending: Radiation Oncology | Admitting: Radiation Oncology

## 2019-03-14 ENCOUNTER — Other Ambulatory Visit: Payer: Self-pay

## 2019-03-14 DIAGNOSIS — C3491 Malignant neoplasm of unspecified part of right bronchus or lung: Secondary | ICD-10-CM | POA: Diagnosis not present

## 2019-03-14 DIAGNOSIS — C3411 Malignant neoplasm of upper lobe, right bronchus or lung: Secondary | ICD-10-CM | POA: Diagnosis not present

## 2019-03-15 ENCOUNTER — Ambulatory Visit
Admission: RE | Admit: 2019-03-15 | Discharge: 2019-03-15 | Disposition: A | Discharge status: Home / Self Care | Payer: PPO | Source: Ambulatory Visit | Attending: Radiation Oncology | Admitting: Radiation Oncology

## 2019-03-15 ENCOUNTER — Other Ambulatory Visit: Payer: Self-pay

## 2019-03-15 DIAGNOSIS — C3411 Malignant neoplasm of upper lobe, right bronchus or lung: Secondary | ICD-10-CM | POA: Diagnosis not present

## 2019-03-15 DIAGNOSIS — C3491 Malignant neoplasm of unspecified part of right bronchus or lung: Secondary | ICD-10-CM | POA: Diagnosis not present

## 2019-03-18 ENCOUNTER — Other Ambulatory Visit: Payer: Self-pay

## 2019-03-18 ENCOUNTER — Encounter: Payer: Self-pay | Admitting: Internal Medicine

## 2019-03-18 ENCOUNTER — Inpatient Hospital Stay: Payer: PPO

## 2019-03-18 ENCOUNTER — Inpatient Hospital Stay (HOSPITAL_BASED_OUTPATIENT_CLINIC_OR_DEPARTMENT_OTHER): Payer: PPO | Admitting: Internal Medicine

## 2019-03-18 ENCOUNTER — Ambulatory Visit
Admission: RE | Admit: 2019-03-18 | Discharge: 2019-03-18 | Disposition: A | Discharge status: Home / Self Care | Payer: PPO | Source: Ambulatory Visit | Attending: Radiation Oncology | Admitting: Radiation Oncology

## 2019-03-18 VITALS — BP 128/59 | HR 90 | Resp 18 | Ht 65.0 in | Wt 220.8 lb

## 2019-03-18 DIAGNOSIS — C3491 Malignant neoplasm of unspecified part of right bronchus or lung: Secondary | ICD-10-CM

## 2019-03-18 DIAGNOSIS — Z5111 Encounter for antineoplastic chemotherapy: Secondary | ICD-10-CM | POA: Diagnosis not present

## 2019-03-18 DIAGNOSIS — C3411 Malignant neoplasm of upper lobe, right bronchus or lung: Secondary | ICD-10-CM | POA: Diagnosis not present

## 2019-03-18 DIAGNOSIS — I10 Essential (primary) hypertension: Secondary | ICD-10-CM | POA: Diagnosis not present

## 2019-03-18 LAB — CMP (CANCER CENTER ONLY)
ALT: 44 U/L (ref 0–44)
AST: 25 U/L (ref 15–41)
Albumin: 3.6 g/dL (ref 3.5–5.0)
Alkaline Phosphatase: 129 U/L — ABNORMAL HIGH (ref 38–126)
Anion gap: 10 (ref 5–15)
BUN: 18 mg/dL (ref 8–23)
CO2: 25 mmol/L (ref 22–32)
Calcium: 9.1 mg/dL (ref 8.9–10.3)
Chloride: 107 mmol/L (ref 98–111)
Creatinine: 0.89 mg/dL (ref 0.44–1.00)
GFR, Est AFR Am: >60 mL/min (ref >60)
GFR, Estimated: 59 mL/min — ABNORMAL LOW (ref >60)
Glucose, Bld: 154 mg/dL — ABNORMAL HIGH (ref 70–99)
Potassium: 4 mmol/L (ref 3.5–5.1)
Sodium: 142 mmol/L (ref 135–145)
Total Bilirubin: 0.7 mg/dL (ref 0.3–1.2)
Total Protein: 7.1 g/dL (ref 6.5–8.1)

## 2019-03-18 LAB — ACID FAST CULTURE WITH REFLEXED SENSITIVITIES (MYCOBACTERIA): Acid Fast Culture: NEGATIVE

## 2019-03-18 LAB — CBC WITH DIFFERENTIAL (CANCER CENTER ONLY)
Abs Immature Granulocytes: 0.03 10*3/uL (ref 0.00–0.07)
Basophils Absolute: 0 10*3/uL (ref 0.0–0.1)
Basophils Relative: 1 %
Eosinophils Absolute: 0.3 10*3/uL (ref 0.0–0.5)
Eosinophils Relative: 5 %
HCT: 48.5 % — ABNORMAL HIGH (ref 36.0–46.0)
Hemoglobin: 15.5 g/dL — ABNORMAL HIGH (ref 12.0–15.0)
Immature Granulocytes: 1 %
Lymphocytes Relative: 7 %
Lymphs Abs: 0.3 10*3/uL — ABNORMAL LOW (ref 0.7–4.0)
MCH: 28.9 pg (ref 26.0–34.0)
MCHC: 32 g/dL (ref 30.0–36.0)
MCV: 90.5 fL (ref 80.0–100.0)
Monocytes Absolute: 0.2 10*3/uL (ref 0.1–1.0)
Monocytes Relative: 5 %
Neutro Abs: 3.9 10*3/uL (ref 1.7–7.7)
Neutrophils Relative %: 81 %
Platelet Count: 322 10*3/uL (ref 150–400)
RBC: 5.36 MIL/uL — ABNORMAL HIGH (ref 3.87–5.11)
RDW: 13.9 % (ref 11.5–15.5)
WBC Count: 4.8 10*3/uL (ref 4.0–10.5)
nRBC: 0 % (ref 0.0–0.2)

## 2019-03-18 MED ORDER — PALONOSETRON HCL INJECTION 0.25 MG/5ML
INTRAVENOUS | Status: AC
  Filled 2019-03-18: qty 5

## 2019-03-18 MED ORDER — SODIUM CHLORIDE 0.9 % IV SOLN
45.0000 mg/m2 | Freq: Once | INTRAVENOUS | Status: AC
  Administered 2019-03-18: 96 mg via INTRAVENOUS
  Filled 2019-03-18: qty 16

## 2019-03-18 MED ORDER — FAMOTIDINE IN NACL 20-0.9 MG/50ML-% IV SOLN
INTRAVENOUS | Status: AC
  Filled 2019-03-18: qty 50

## 2019-03-18 MED ORDER — FAMOTIDINE IN NACL 20-0.9 MG/50ML-% IV SOLN
20.0000 mg | Freq: Once | INTRAVENOUS | Status: AC
  Administered 2019-03-18: 20 mg via INTRAVENOUS

## 2019-03-18 MED ORDER — PALONOSETRON HCL INJECTION 0.25 MG/5ML
0.2500 mg | Freq: Once | INTRAVENOUS | Status: AC
  Administered 2019-03-18: 13:00:00 0.25 mg via INTRAVENOUS

## 2019-03-18 MED ORDER — DIPHENHYDRAMINE HCL 50 MG/ML IJ SOLN
INTRAMUSCULAR | Status: AC
  Filled 2019-03-18: qty 1

## 2019-03-18 MED ORDER — SODIUM CHLORIDE 0.9 % IV SOLN
Freq: Once | INTRAVENOUS | Status: AC
  Filled 2019-03-18: qty 250

## 2019-03-18 MED ORDER — DIPHENHYDRAMINE HCL 50 MG/ML IJ SOLN
25.0000 mg | Freq: Once | INTRAMUSCULAR | Status: AC
  Administered 2019-03-18: 25 mg via INTRAVENOUS

## 2019-03-18 MED ORDER — SODIUM CHLORIDE 0.9 % IV SOLN
185.0000 mg | Freq: Once | INTRAVENOUS | Status: AC
  Administered 2019-03-18: 190 mg via INTRAVENOUS
  Filled 2019-03-18: qty 19

## 2019-03-18 MED ORDER — DEXAMETHASONE SODIUM PHOSPHATE 10 MG/ML IJ SOLN
INTRAMUSCULAR | Status: AC
  Filled 2019-03-18: qty 1

## 2019-03-18 MED ORDER — DEXAMETHASONE SODIUM PHOSPHATE 10 MG/ML IJ SOLN
10.0000 mg | Freq: Once | INTRAMUSCULAR | Status: AC
  Administered 2019-03-18: 10 mg via INTRAVENOUS

## 2019-03-18 NOTE — Patient Instructions (Signed)
[facility] Discharge Instructions for Patients Receiving Chemotherapy  Today you received the following chemotherapy agents: Taxol, Carboplatin  To help prevent nausea and vomiting after your treatment, we encourage you to take your nausea medication as directed.   If you develop nausea and vomiting that is not controlled by your nausea medication, call the clinic.   BELOW ARE SYMPTOMS THAT SHOULD BE REPORTED IMMEDIATELY:  *FEVER GREATER THAN 100.5 F  *CHILLS WITH OR WITHOUT FEVER  NAUSEA AND VOMITING THAT IS NOT CONTROLLED WITH YOUR NAUSEA MEDICATION  *UNUSUAL SHORTNESS OF BREATH  *UNUSUAL BRUISING OR BLEEDING  TENDERNESS IN MOUTH AND THROAT WITH OR WITHOUT PRESENCE OF ULCERS  *URINARY PROBLEMS  *BOWEL PROBLEMS  UNUSUAL RASH Items with * indicate a potential emergency and should be followed up as soon as possible.  Feel free to call the clinic should you have any questions or concerns. The clinic phone number is [phone].  Please show the [place] at check-in to the Emergency Department and triage nurse.

## 2019-03-18 NOTE — Progress Notes (Signed)
[name]   [phone]  OFFICE PROGRESS NOTE  Prince Solian, MD [place] [address]  DIAGNOSIS: stage IIIc/IV (T4, N2, M0//M1a) lNon-small cell carcinoma, poorly differentiated adenocarcinoma,  presented with large right upper lobe lung mass in addition to mediastinal lymphadenopathy and suspicious left upper lobe nodule diagnosed in December 2020.  Molecular studies by [employer]:  KRASG12D 0.2% Binimetinib  [id number] 4.8% None (VUS) No (VUS)  [id number] 0.2% None (VUS) No (VUS)  PRIOR THERAPY: None.  CURRENT THERAPY: Concurrent chemoradiation with weekly carboplatin for AUC of 2 and paclitaxel 45 mg/M2.  First dose of March 04, 2019.  Status post 2 cycles.  INTERVAL HISTORY: Ashley Parker 86 y.o. female returns to the clinic today for follow-up visit.  The patient is feeling fine today with no concerning complaints except for mild nausea and one episode of diarrhea.  She also has dry cough but no significant chest pain, shortness of breath or hemoptysis.  She denied having any recent weight loss or night sweats.  She has no headache or visual changes.  She has no fever or chills.  She denied having any current nausea, vomiting, diarrhea or constipation.  She continues to tolerate her course of concurrent chemoradiation fairly well.  She is here today for evaluation before starting cycle #3 of her treatment.  MEDICAL HISTORY: Past Medical History:  Diagnosis Date  . Arthritis   . Basal cell carcinoma (BCC) of skin of face 2016   removed in 2016  . COPD (chronic obstructive pulmonary disease) (HCC)    Mild  . Crohn's ileitis ([facility])    Mild, mostly asymptomatic, not on therapy  . Fundic gland polyposis of stomach   . GERD (gastroesophageal reflux disease)   . Glaucoma   . Hemorrhoids    Internal and External  . History of hiatal hernia   . Hypertension   . Macular degeneration   . Obesity,  unspecified   . Osteoporosis   . Pelvic fracture ([facility]) 2007  . Stricture of esophagus    distal  . Unspecified vitamin D deficiency     ALLERGIES:  is allergic to wasp venom; biaxin [clarithromycin]; and brimonidine.  MEDICATIONS:  Current Outpatient Medications  Medication Sig Dispense Refill  . albuterol (VENTOLIN HFA) 108 (90 Base) MCG/ACT inhaler Inhale 1 puff into the lungs as needed for wheezing or shortness of breath.    . Ascorbic Acid (VITAMIN C) 500 MG CHEW Chew 500 mg by mouth daily.    . B Complex-C (SUPER B COMPLEX PO) Take 1 tablet by mouth daily.    . Calcium Carb-Cholecalciferol (CALCIUM 600+D3 PO) Take 1-2 tablets by mouth See admin instructions. Take 1 tablet in the morning & 2 tablet at night    . calcium elemental as carbonate (BARIATRIC TUMS ULTRA) 400 MG chewable tablet Chew 2,000 mg by mouth at bedtime.    . Cholecalciferol (VITAMIN D3) 2000 units TABS Take 2,000 Units by mouth daily.     . diclofenac Sodium (VOLTAREN) 1 % GEL Apply 1 application topically 4 (four) times daily as needed (pain.).     [name] dorzolamide (TRUSOPT) 2 % ophthalmic solution Place 1 drop into the left eye 2 (two) times daily.     [name] EPINEPHrine 0.3 mg/0.3 mL IJ SOAJ injection Inject 0.3 mg into the muscle as needed (for allergic reaction).    . Flaxseed, Linseed, (FLAXSEED OIL PO) Take 1 tablet by mouth daily.    [name]  fluticasone (FLONASE) 50 MCG/ACT nasal spray Place 1 spray into both nostrils daily.     . irbesartan (AVAPRO) 300 MG tablet Take 300 mg by mouth daily.     [name] LUMIGAN 0.01 % SOLN Place 1 drop into the right eye at bedtime.   4  . Magnesium 250 MG TABS Take 250 mg by mouth daily.    . Multiple Vitamins-Minerals (PRESERVISION AREDS 2 PO) Take 1 tablet by mouth 2 (two) times daily.    . Omega-3 Fatty Acids (FISH OIL) 1000 MG CAPS Take 1,000 mg by mouth daily.    Vladimir Faster Glycol-Propyl Glycol (SYSTANE) 0.4-0.3 % SOLN Place 1 drop into both eyes 3 (three) times daily as needed (for  tired/dry eyes.).     [name] prochlorperazine (COMPAZINE) 10 MG tablet Take 1 tablet (10 mg total) by mouth every 6 (six) hours as needed for nausea or vomiting. (Patient not taking: Reported on 02/26/2019) 30 tablet 0  . timolol (TIMOPTIC) 0.5 % ophthalmic solution Place 1 drop into the left eye 2 (two) times daily.    . Tiotropium Bromide-Olodaterol (STIOLTO RESPIMAT) 2.5-2.5 MCG/ACT AERS Inhale 2 puffs into the lungs daily. 2 g 0  . TURMERIC PO Take 1,500 mg by mouth daily.    . vitamin B-12 (CYANOCOBALAMIN) 1000 MCG tablet Take 1,000 mcg by mouth daily.    . vitamin E 400 UNIT capsule Take 400 Units by mouth daily. (180 mg)    . Zinc 30 MG TABS Take 30 mg by mouth daily.     No current facility-administered medications for this visit.    SURGICAL HISTORY:  Past Surgical History:  Procedure Laterality Date  . APPENDECTOMY    . BACK SURGERY    . CATARACT EXTRACTION    . COLONOSCOPY  07/27/2001, 07/08/2011   Multiple  . ESOPHAGEAL DILATION  2013  . ESOPHAGOGASTRODUODENOSCOPY     multiple  . LAPAROSCOPIC APPENDECTOMY N/A 02/19/2014   Procedure: APPENDECTOMY LAPAROSCOPIC;  Surgeon: Erroll Luna, MD;  Location: [facility];  Service: General;  Laterality: N/A;  . MINOR PLACEMENT OF FIDUCIAL  08/23/2017   Procedure: MINOR PLACEMENT OF FIDUCIAL - Left Upper Lobe Lung x2, Right Upper Lobe Lung x3;  Surgeon: Collene Gobble, MD;  Location: [facility];  Service: Thoracic;;  . TONSILLECTOMY AND ADENOIDECTOMY     and adenoids   . TOTAL KNEE ARTHROPLASTY Left 2007  . VIDEO BRONCHOSCOPY WITH ENDOBRONCHIAL NAVIGATION N/A 08/23/2017   Procedure: VIDEO BRONCHOSCOPY WITH ENDOBRONCHIAL NAVIGATION;  Surgeon: Collene Gobble, MD;  Location: [facility];  Service: Thoracic;  Laterality: N/A;  . VIDEO BRONCHOSCOPY WITH ENDOBRONCHIAL NAVIGATION N/A 01/31/2019   Procedure: VIDEO BRONCHOSCOPY WITH ENDOBRONCHIAL NAVIGATION;  Surgeon: Garner Nash, DO;  Location: [facility];  Service: Thoracic;  Laterality: N/A;  . VIDEO  BRONCHOSCOPY WITH ENDOBRONCHIAL ULTRASOUND N/A 01/31/2019   Procedure: VIDEO BRONCHOSCOPY WITH ENDOBRONCHIAL ULTRASOUND;  Surgeon: Garner Nash, DO;  Location: [facility];  Service: Thoracic;  Laterality: N/A;  . WRIST FRACTURE SURGERY Left 2018   with screws    REVIEW OF SYSTEMS:  A comprehensive review of systems was negative except for: Respiratory: positive for cough Gastrointestinal: positive for diarrhea and nausea   PHYSICAL EXAMINATION: General appearance: alert, cooperative and no distress Head: Normocephalic, without obvious abnormality, atraumatic Neck: no adenopathy, no JVD, supple, symmetrical, trachea midline and thyroid not enlarged, symmetric, no tenderness/mass/nodules Lymph nodes: Cervical, supraclavicular, and axillary nodes normal. Resp: clear to auscultation bilaterally Back: symmetric, no curvature. ROM normal. No CVA tenderness.  Cardio: regular rate and rhythm, S1, S2 normal, no murmur, click, rub or gallop GI: soft, non-tender; bowel sounds normal; no masses,  no organomegaly Extremities: extremities normal, atraumatic, no cyanosis or edema  ECOG PERFORMANCE STATUS: 1 - Symptomatic but completely ambulatory  Blood pressure (!) 128/59, pulse 90, resp. rate 18, height 5' 5"  (1.651 m), weight 220 lb 12.8 oz (100.2 kg), SpO2 94 %.  LABORATORY DATA: Lab Results  Component Value Date   WBC 4.8 03/18/2019   HGB 15.5 (H) 03/18/2019   HCT 48.5 (H) 03/18/2019   MCV 90.5 03/18/2019   PLT 322 03/18/2019      Chemistry      Component Value Date/Time   NA 138 03/11/2019 1411   K 4.5 03/11/2019 1411   CL 104 03/11/2019 1411   CO2 22 03/11/2019 1411   BUN 20 03/11/2019 1411   CREATININE 0.76 03/11/2019 1411      Component Value Date/Time   CALCIUM 9.5 03/11/2019 1411   ALKPHOS 137 (H) 03/11/2019 1411   AST 32 03/11/2019 1411   ALT 45 (H) 03/11/2019 1411   BILITOT 1.0 03/11/2019 1411       RADIOGRAPHIC STUDIES: No results found.  ASSESSMENT AND PLAN: This  is a very pleasant 86 years old white female with a stage IIIc/IV non-small cell lung cancer, poorly differentiated adenocarcinoma presented with right upper lobe lung mass in addition to mediastinal lymphadenopathy diagnosed in December 2020. Molecular studies by [facility] showed no actionable mutations. She is currently undergoing a course of concurrent chemoradiation with weekly carboplatin and paclitaxel status post 2 cycles.  The patient is tolerating her treatment well with no concerning adverse effects. I recommended for her to proceed with cycle #3 today as planned. She will come back for follow-up visit in 2 weeks for evaluation before starting cycle #5. The patient was advised to call immediately if she has any concerning symptoms in the interval. The patient voices understanding of current disease status and treatment options and is in agreement with the current care plan. All questions were answered. The patient knows to call the clinic with any problems, questions or concerns. We can certainly see the patient much sooner if necessary. The total time spent in the appointment was 15 minutes.  Disclaimer: This note was dictated with voice recognition software. Similar sounding words can inadvertently be transcribed and may not be corrected upon review.

## 2019-03-19 ENCOUNTER — Other Ambulatory Visit: Payer: Self-pay

## 2019-03-19 ENCOUNTER — Ambulatory Visit
Admission: RE | Admit: 2019-03-19 | Discharge: 2019-03-19 | Disposition: A | Discharge status: Home / Self Care | Payer: PPO | Source: Ambulatory Visit | Attending: Radiation Oncology | Admitting: Radiation Oncology

## 2019-03-19 ENCOUNTER — Telehealth: Payer: Self-pay | Admitting: Internal Medicine

## 2019-03-19 DIAGNOSIS — C3491 Malignant neoplasm of unspecified part of right bronchus or lung: Secondary | ICD-10-CM | POA: Diagnosis not present

## 2019-03-19 DIAGNOSIS — C3411 Malignant neoplasm of upper lobe, right bronchus or lung: Secondary | ICD-10-CM | POA: Diagnosis not present

## 2019-03-19 NOTE — Telephone Encounter (Signed)
Scheduled per los. Called and left msg. Mailed printout  °

## 2019-03-20 ENCOUNTER — Ambulatory Visit
Admission: RE | Admit: 2019-03-20 | Discharge: 2019-03-20 | Disposition: A | Discharge status: Home / Self Care | Payer: PPO | Source: Ambulatory Visit | Attending: Radiation Oncology | Admitting: Radiation Oncology

## 2019-03-20 ENCOUNTER — Other Ambulatory Visit: Payer: Self-pay

## 2019-03-20 ENCOUNTER — Ambulatory Visit: Payer: PPO | Admitting: Emergency Medicine

## 2019-03-20 DIAGNOSIS — C3491 Malignant neoplasm of unspecified part of right bronchus or lung: Secondary | ICD-10-CM | POA: Diagnosis not present

## 2019-03-20 DIAGNOSIS — C3411 Malignant neoplasm of upper lobe, right bronchus or lung: Secondary | ICD-10-CM | POA: Diagnosis not present

## 2019-03-21 ENCOUNTER — Ambulatory Visit
Admission: RE | Admit: 2019-03-21 | Discharge: 2019-03-21 | Disposition: A | Discharge status: Home / Self Care | Payer: PPO | Source: Ambulatory Visit | Attending: Radiation Oncology | Admitting: Radiation Oncology

## 2019-03-21 ENCOUNTER — Other Ambulatory Visit: Payer: Self-pay

## 2019-03-21 DIAGNOSIS — C3411 Malignant neoplasm of upper lobe, right bronchus or lung: Secondary | ICD-10-CM | POA: Diagnosis not present

## 2019-03-21 DIAGNOSIS — C3491 Malignant neoplasm of unspecified part of right bronchus or lung: Secondary | ICD-10-CM | POA: Diagnosis not present

## 2019-03-22 ENCOUNTER — Ambulatory Visit
Admission: RE | Admit: 2019-03-22 | Discharge: 2019-03-22 | Disposition: A | Discharge status: Home / Self Care | Payer: PPO | Source: Ambulatory Visit | Attending: Radiation Oncology | Admitting: Radiation Oncology

## 2019-03-22 ENCOUNTER — Other Ambulatory Visit: Payer: Self-pay

## 2019-03-22 DIAGNOSIS — C3491 Malignant neoplasm of unspecified part of right bronchus or lung: Secondary | ICD-10-CM | POA: Diagnosis not present

## 2019-03-22 DIAGNOSIS — C3411 Malignant neoplasm of upper lobe, right bronchus or lung: Secondary | ICD-10-CM | POA: Diagnosis not present

## 2019-03-25 ENCOUNTER — Inpatient Hospital Stay: Payer: PPO

## 2019-03-25 ENCOUNTER — Ambulatory Visit
Admission: RE | Admit: 2019-03-25 | Discharge: 2019-03-25 | Disposition: A | Discharge status: Home / Self Care | Payer: PPO | Source: Ambulatory Visit | Attending: Radiation Oncology | Admitting: Radiation Oncology

## 2019-03-25 ENCOUNTER — Other Ambulatory Visit: Payer: Self-pay

## 2019-03-25 VITALS — BP 130/61 | HR 100 | Temp 97.8°F | Resp 18

## 2019-03-25 DIAGNOSIS — C3491 Malignant neoplasm of unspecified part of right bronchus or lung: Secondary | ICD-10-CM

## 2019-03-25 DIAGNOSIS — Z5111 Encounter for antineoplastic chemotherapy: Secondary | ICD-10-CM | POA: Diagnosis not present

## 2019-03-25 DIAGNOSIS — C3411 Malignant neoplasm of upper lobe, right bronchus or lung: Secondary | ICD-10-CM | POA: Diagnosis not present

## 2019-03-25 LAB — CBC WITH DIFFERENTIAL (CANCER CENTER ONLY)
Abs Immature Granulocytes: 0.04 10*3/uL (ref 0.00–0.07)
Basophils Absolute: 0 10*3/uL (ref 0.0–0.1)
Basophils Relative: 1 %
Eosinophils Absolute: 0.1 10*3/uL (ref 0.0–0.5)
Eosinophils Relative: 3 %
HCT: 45.1 % (ref 36.0–46.0)
Hemoglobin: 14.8 g/dL (ref 12.0–15.0)
Immature Granulocytes: 1 %
Lymphocytes Relative: 8 %
Lymphs Abs: 0.3 10*3/uL — ABNORMAL LOW (ref 0.7–4.0)
MCH: 29 pg (ref 26.0–34.0)
MCHC: 32.8 g/dL (ref 30.0–36.0)
MCV: 88.4 fL (ref 80.0–100.0)
Monocytes Absolute: 0.3 10*3/uL (ref 0.1–1.0)
Monocytes Relative: 7 %
Neutro Abs: 3.4 10*3/uL (ref 1.7–7.7)
Neutrophils Relative %: 80 %
Platelet Count: 290 10*3/uL (ref 150–400)
RBC: 5.1 MIL/uL (ref 3.87–5.11)
RDW: 14 % (ref 11.5–15.5)
WBC Count: 4.3 10*3/uL (ref 4.0–10.5)
nRBC: 0 % (ref 0.0–0.2)

## 2019-03-25 LAB — CMP (CANCER CENTER ONLY)
ALT: 40 U/L (ref 0–44)
AST: 24 U/L (ref 15–41)
Albumin: 3.5 g/dL (ref 3.5–5.0)
Alkaline Phosphatase: 141 U/L — ABNORMAL HIGH (ref 38–126)
Anion gap: 11 (ref 5–15)
BUN: 14 mg/dL (ref 8–23)
CO2: 21 mmol/L — ABNORMAL LOW (ref 22–32)
Calcium: 9.2 mg/dL (ref 8.9–10.3)
Chloride: 107 mmol/L (ref 98–111)
Creatinine: 0.71 mg/dL (ref 0.44–1.00)
GFR, Est AFR Am: >60 mL/min (ref >60)
GFR, Estimated: >60 mL/min (ref >60)
Glucose, Bld: 149 mg/dL — ABNORMAL HIGH (ref 70–99)
Potassium: 3.8 mmol/L (ref 3.5–5.1)
Sodium: 139 mmol/L (ref 135–145)
Total Bilirubin: 0.7 mg/dL (ref 0.3–1.2)
Total Protein: 6.9 g/dL (ref 6.5–8.1)

## 2019-03-25 MED ORDER — FAMOTIDINE IN NACL 20-0.9 MG/50ML-% IV SOLN
20.0000 mg | Freq: Once | INTRAVENOUS | Status: AC
  Administered 2019-03-25: 20 mg via INTRAVENOUS

## 2019-03-25 MED ORDER — PALONOSETRON HCL INJECTION 0.25 MG/5ML
0.2500 mg | Freq: Once | INTRAVENOUS | Status: AC
  Administered 2019-03-25: 0.25 mg via INTRAVENOUS

## 2019-03-25 MED ORDER — DIPHENHYDRAMINE HCL 50 MG/ML IJ SOLN
INTRAMUSCULAR | Status: AC
  Filled 2019-03-25: qty 1

## 2019-03-25 MED ORDER — SODIUM CHLORIDE 0.9 % IV SOLN
45.0000 mg/m2 | Freq: Once | INTRAVENOUS | Status: AC
  Administered 2019-03-25: 96 mg via INTRAVENOUS
  Filled 2019-03-25: qty 16

## 2019-03-25 MED ORDER — DEXAMETHASONE SODIUM PHOSPHATE 10 MG/ML IJ SOLN
INTRAMUSCULAR | Status: AC
  Filled 2019-03-25: qty 1

## 2019-03-25 MED ORDER — HEPARIN SOD (PORK) LOCK FLUSH 100 UNIT/ML IV SOLN
500.0000 [IU] | Freq: Once | INTRAVENOUS | Status: DC | PRN
  Filled 2019-03-25: qty 5

## 2019-03-25 MED ORDER — SODIUM CHLORIDE 0.9% FLUSH
10.0000 mL | INTRAVENOUS | Status: DC | PRN
  Filled 2019-03-25: qty 10

## 2019-03-25 MED ORDER — PALONOSETRON HCL INJECTION 0.25 MG/5ML
INTRAVENOUS | Status: AC
  Filled 2019-03-25: qty 5

## 2019-03-25 MED ORDER — SODIUM CHLORIDE 0.9 % IV SOLN
Freq: Once | INTRAVENOUS | Status: AC
  Filled 2019-03-25: qty 250

## 2019-03-25 MED ORDER — DIPHENHYDRAMINE HCL 50 MG/ML IJ SOLN
25.0000 mg | Freq: Once | INTRAMUSCULAR | Status: AC
  Administered 2019-03-25: 25 mg via INTRAVENOUS

## 2019-03-25 MED ORDER — SODIUM CHLORIDE 0.9 % IV SOLN
185.0000 mg | Freq: Once | INTRAVENOUS | Status: AC
  Administered 2019-03-25: 190 mg via INTRAVENOUS
  Filled 2019-03-25: qty 19

## 2019-03-25 MED ORDER — DEXAMETHASONE SODIUM PHOSPHATE 10 MG/ML IJ SOLN
10.0000 mg | Freq: Once | INTRAMUSCULAR | Status: AC
  Administered 2019-03-25: 10 mg via INTRAVENOUS

## 2019-03-25 MED ORDER — FAMOTIDINE IN NACL 20-0.9 MG/50ML-% IV SOLN
INTRAVENOUS | Status: AC
  Filled 2019-03-25: qty 50

## 2019-03-25 NOTE — Patient Instructions (Signed)
Ashley Parker Discharge Instructions for Patients Receiving Chemotherapy  Today you received the following chemotherapy agents Taxol and Carboplatin.   To help prevent nausea and vomiting after your treatment, we encourage you to take your nausea medication as directed BUT NO ZOFRAN FOR 3 DAYS AFTER CHEMO.   If you develop nausea and vomiting that is not controlled by your nausea medication, call the clinic.   BELOW ARE SYMPTOMS THAT SHOULD BE REPORTED IMMEDIATELY:  *FEVER GREATER THAN 100.5 F  *CHILLS WITH OR WITHOUT FEVER  NAUSEA AND VOMITING THAT IS NOT CONTROLLED WITH YOUR NAUSEA MEDICATION  *UNUSUAL SHORTNESS OF BREATH  *UNUSUAL BRUISING OR BLEEDING  TENDERNESS IN MOUTH AND THROAT WITH OR WITHOUT PRESENCE OF ULCERS  *URINARY PROBLEMS  *BOWEL PROBLEMS  UNUSUAL RASH Items with * indicate a potential emergency and should be followed up as soon as possible.  Feel free to call the clinic you have any questions or concerns. The clinic phone number is [phone].  Please show the [name] at check-in to the Emergency Department and triage nurse.

## 2019-03-26 ENCOUNTER — Ambulatory Visit
Admission: RE | Admit: 2019-03-26 | Discharge: 2019-03-26 | Disposition: A | Discharge status: Home / Self Care | Payer: PPO | Source: Ambulatory Visit | Attending: Radiation Oncology | Admitting: Radiation Oncology

## 2019-03-26 ENCOUNTER — Telehealth: Payer: Self-pay | Admitting: Medical Oncology

## 2019-03-26 ENCOUNTER — Other Ambulatory Visit: Payer: Self-pay | Admitting: Medical Oncology

## 2019-03-26 ENCOUNTER — Other Ambulatory Visit: Payer: Self-pay | Admitting: Radiation Oncology

## 2019-03-26 ENCOUNTER — Other Ambulatory Visit: Payer: Self-pay

## 2019-03-26 DIAGNOSIS — C3411 Malignant neoplasm of upper lobe, right bronchus or lung: Secondary | ICD-10-CM | POA: Diagnosis not present

## 2019-03-26 DIAGNOSIS — C3491 Malignant neoplasm of unspecified part of right bronchus or lung: Secondary | ICD-10-CM

## 2019-03-26 MED ORDER — SUCRALFATE 1 G PO TABS: 1.0000 g | ORAL_TABLET | Freq: Three times a day (TID) | ORAL | 1 refills | Status: DC

## 2019-03-26 NOTE — Telephone Encounter (Signed)
I left a voice message on pt home phone re new appts.   Wed 1/27 -COVID test ( pre thoracentesis) at [facility] - arrive at 1:00pm-  Friday -1/29-thoracentesis - therapeutic and diagnostic -WL arrive at [facility].  I asked pt to return my call to confirm appts.

## 2019-03-27 ENCOUNTER — Other Ambulatory Visit (HOSPITAL_COMMUNITY)
Admission: RE | Admit: 2019-03-27 | Discharge: 2019-03-27 | Disposition: A | Discharge status: Home / Self Care | Payer: PPO | Source: Ambulatory Visit | Attending: Internal Medicine | Admitting: Internal Medicine

## 2019-03-27 ENCOUNTER — Other Ambulatory Visit: Payer: Self-pay

## 2019-03-27 ENCOUNTER — Ambulatory Visit
Admission: RE | Admit: 2019-03-27 | Discharge: 2019-03-27 | Disposition: A | Discharge status: Home / Self Care | Payer: PPO | Source: Ambulatory Visit | Attending: Radiation Oncology | Admitting: Radiation Oncology

## 2019-03-27 DIAGNOSIS — Z20822 Contact with and (suspected) exposure to covid-19: Secondary | ICD-10-CM | POA: Diagnosis not present

## 2019-03-27 DIAGNOSIS — Z01812 Encounter for preprocedural laboratory examination: Secondary | ICD-10-CM | POA: Diagnosis not present

## 2019-03-27 DIAGNOSIS — C3411 Malignant neoplasm of upper lobe, right bronchus or lung: Secondary | ICD-10-CM | POA: Diagnosis not present

## 2019-03-27 DIAGNOSIS — C3491 Malignant neoplasm of unspecified part of right bronchus or lung: Secondary | ICD-10-CM | POA: Diagnosis not present

## 2019-03-27 LAB — SARS CORONAVIRUS 2 (TAT 6-24 HRS): SARS Coronavirus 2: NEGATIVE

## 2019-03-27 LAB — GUARDANT 360

## 2019-03-27 NOTE — Telephone Encounter (Signed)
Sravya confirmed ,covid test and thoracentesis appts.

## 2019-03-28 ENCOUNTER — Ambulatory Visit
Admission: RE | Admit: 2019-03-28 | Discharge: 2019-03-28 | Disposition: A | Discharge status: Home / Self Care | Payer: PPO | Source: Ambulatory Visit | Attending: Radiation Oncology | Admitting: Radiation Oncology

## 2019-03-28 ENCOUNTER — Ambulatory Visit: Payer: PPO | Admitting: Emergency Medicine

## 2019-03-28 ENCOUNTER — Encounter: Payer: Self-pay | Admitting: Emergency Medicine

## 2019-03-28 ENCOUNTER — Other Ambulatory Visit: Payer: Self-pay

## 2019-03-28 DIAGNOSIS — J90 Pleural effusion, not elsewhere classified: Secondary | ICD-10-CM

## 2019-03-28 DIAGNOSIS — C3491 Malignant neoplasm of unspecified part of right bronchus or lung: Secondary | ICD-10-CM | POA: Diagnosis not present

## 2019-03-28 DIAGNOSIS — J45991 Cough variant asthma: Secondary | ICD-10-CM | POA: Diagnosis not present

## 2019-03-28 DIAGNOSIS — C3411 Malignant neoplasm of upper lobe, right bronchus or lung: Secondary | ICD-10-CM | POA: Diagnosis not present

## 2019-03-28 NOTE — Assessment & Plan Note (Signed)
  Continue to follow with Dr. Sondra Come for your radiation, with Dr. Julien Nordmann for your chemotherapy. Repeat imaging and CT scan as per oncology plans and timing Congratulations on getting your first COVID-19 vaccination, get your second as scheduled

## 2019-03-28 NOTE — Assessment & Plan Note (Signed)
She apparently has an effusion, although I don't have any imaging - she says it has been identified at [facility] where she is of course imaged frequently. Plan is for a thora on 1/29.

## 2019-03-28 NOTE — Addendum Note (Signed)
Addended by: Valerie Salts on: 03/28/2019 11:43 AM   Modules accepted: Orders

## 2019-03-28 NOTE — Progress Notes (Signed)
   Subjective:    Patient ID: Ashley Parker, female    DOB: 07-01-1931, 86 y.o.   MRN: [id number]  HPI  ROV 03/28/2019 --follow-up visit 86 year old woman with mild COPD and hypertension, newly diagnosed poorly differentiated adenocarcinoma.  She is following with Dr. Julien Nordmann and radiation oncology.  At her last visit I stopped her inhaled steroid and did a trial of Stiolto.  She reports today that she hasn't really noticed any change on Stiolto. She is doing XRT. Dr Sondra Come has been following her imaging and she is planning for a R thoracentesis tomorrow. She has had the 1st COVID shot.    Review of Systems As above.      Objective:   Physical Exam Vitals:   03/28/19 1052  BP: 122/78  Pulse: (!) 104  Temp: 98.7 F (37.1 C)  TempSrc: Temporal  SpO2: 94%  Weight: 222 lb (100.7 kg)  Height: 5' 5"  (1.651 m)   Gen: Pleasant, obese, in no distress,  normal affect  ENT: No lesions,  mouth clear,  oropharynx clear, no postnasal drip  Neck: No JVD, no stridor  Lungs: No use of accessory muscles, decreased BS on the R, esp at base.   Cardiovascular: RRR, heart sounds normal, no murmur or gallops, no peripheral edema  Musculoskeletal: No deformities, no cyanosis or clubbing  Neuro: alert, non focal  Skin: Warm, no lesions or rash      Assessment & Plan:  Cough variant asthma Mild obstruction by PFT 2016.  I suspect that her dyspnea is being driven more by her malignancy, effusion than obstructive lung disease.  She did not respond to [employer], plan to stop for now.  Continue albuterol as needed.  May decide to repeat her pulmonary function testing later this year after her initial cancer treatment has been completed.  Adenocarcinoma of right lung, stage 3 (HCC)  Continue to follow with Dr. Sondra Come for your radiation, with Dr. Julien Nordmann for your chemotherapy. Repeat imaging and CT scan as per oncology plans and timing Congratulations on getting your first COVID-19 vaccination,  get your second as scheduled  Pleural effusion She apparently has an effusion, although I don't have any imaging - she says it has been identified at [facility] where she is of course imaged frequently. Plan is for a thora on 1/29.   Baltazar Apo, MD, PhD 03/28/2019, 11:28 AM [facility] [phone] or if no answer [phone]

## 2019-03-28 NOTE — Patient Instructions (Addendum)
We will stop Stiolto for now. Please keep your albuterol available to use 2 puffs if needed for shortness of breath, chest tightness, wheezing. We may decide to repeat your pulmonary function testing later this year after your initial cancer treatment has been completed Agree with thoracentesis as planned by Dr. Sondra Come Continue to follow with Dr. Sondra Come  for your radiation, with Dr. Julien Nordmann for your chemotherapy. Repeat imaging and CT scan as per oncology plans and timing Congratulations on getting your first COVID-19 vaccination, get your second as scheduled Follow with Dr Lamonte Sakai in 6 months or sooner if you have any problems

## 2019-03-28 NOTE — Assessment & Plan Note (Signed)
Mild obstruction by PFT 2016.  I suspect that her dyspnea is being driven more by her malignancy, effusion than obstructive lung disease.  She did not respond to [employer], plan to stop for now.  Continue albuterol as needed.  May decide to repeat her pulmonary function testing later this year after her initial cancer treatment has been completed.

## 2019-03-29 ENCOUNTER — Ambulatory Visit (HOSPITAL_COMMUNITY)
Admission: RE | Admit: 2019-03-29 | Discharge: 2019-03-29 | Disposition: A | Discharge status: Home / Self Care | Payer: PPO | Source: Ambulatory Visit | Attending: Radiology | Admitting: Radiology

## 2019-03-29 ENCOUNTER — Ambulatory Visit: Payer: PPO

## 2019-03-29 ENCOUNTER — Ambulatory Visit (HOSPITAL_COMMUNITY)
Admission: RE | Admit: 2019-03-29 | Discharge: 2019-03-29 | Disposition: A | Discharge status: Home / Self Care | Payer: PPO | Source: Ambulatory Visit | Attending: Internal Medicine | Admitting: Internal Medicine

## 2019-03-29 DIAGNOSIS — C3491 Malignant neoplasm of unspecified part of right bronchus or lung: Secondary | ICD-10-CM | POA: Diagnosis not present

## 2019-03-29 DIAGNOSIS — J90 Pleural effusion, not elsewhere classified: Secondary | ICD-10-CM | POA: Insufficient documentation

## 2019-03-29 DIAGNOSIS — Z9889 Other specified postprocedural states: Secondary | ICD-10-CM

## 2019-03-29 DIAGNOSIS — R911 Solitary pulmonary nodule: Secondary | ICD-10-CM | POA: Insufficient documentation

## 2019-03-29 DIAGNOSIS — J948 Other specified pleural conditions: Secondary | ICD-10-CM | POA: Diagnosis not present

## 2019-03-29 MED ORDER — LIDOCAINE HCL 1 % IJ SOLN
INTRAMUSCULAR | Status: AC
  Filled 2019-03-29: qty 20

## 2019-03-29 NOTE — Procedures (Signed)
Ultrasound-guided diagnostic and therapeutic right  thoracentesis performed yielding  1.8 liters of yellow fluid. No immediate complications. Follow-up chest x-ray pending. EBL < 2 cc. Due to persistent pt coughing only the above amount of fluid was removed today.

## 2019-04-01 ENCOUNTER — Inpatient Hospital Stay: Payer: PPO

## 2019-04-01 ENCOUNTER — Other Ambulatory Visit: Payer: Self-pay | Admitting: Lab

## 2019-04-01 ENCOUNTER — Ambulatory Visit
Admission: RE | Admit: 2019-04-01 | Discharge: 2019-04-01 | Disposition: A | Discharge status: Home / Self Care | Payer: PPO | Source: Ambulatory Visit | Attending: Radiation Oncology | Admitting: Radiation Oncology

## 2019-04-01 ENCOUNTER — Inpatient Hospital Stay: Payer: PPO | Attending: Internal Medicine | Admitting: Internal Medicine

## 2019-04-01 ENCOUNTER — Other Ambulatory Visit: Payer: Self-pay

## 2019-04-01 ENCOUNTER — Encounter: Payer: Self-pay | Admitting: Internal Medicine

## 2019-04-01 VITALS — BP 141/72 | HR 88 | Temp 98.2°F | Resp 18 | Ht 65.0 in | Wt 215.3 lb

## 2019-04-01 DIAGNOSIS — R590 Localized enlarged lymph nodes: Secondary | ICD-10-CM | POA: Insufficient documentation

## 2019-04-01 DIAGNOSIS — I10 Essential (primary) hypertension: Secondary | ICD-10-CM | POA: Insufficient documentation

## 2019-04-01 DIAGNOSIS — R911 Solitary pulmonary nodule: Secondary | ICD-10-CM | POA: Diagnosis not present

## 2019-04-01 DIAGNOSIS — R0609 Other forms of dyspnea: Secondary | ICD-10-CM | POA: Diagnosis not present

## 2019-04-01 DIAGNOSIS — J948 Other specified pleural conditions: Secondary | ICD-10-CM | POA: Insufficient documentation

## 2019-04-01 DIAGNOSIS — Z79899 Other long term (current) drug therapy: Secondary | ICD-10-CM | POA: Insufficient documentation

## 2019-04-01 DIAGNOSIS — C3411 Malignant neoplasm of upper lobe, right bronchus or lung: Secondary | ICD-10-CM | POA: Insufficient documentation

## 2019-04-01 DIAGNOSIS — C3491 Malignant neoplasm of unspecified part of right bronchus or lung: Secondary | ICD-10-CM

## 2019-04-01 DIAGNOSIS — R5383 Other fatigue: Secondary | ICD-10-CM | POA: Diagnosis not present

## 2019-04-01 DIAGNOSIS — M1990 Unspecified osteoarthritis, unspecified site: Secondary | ICD-10-CM | POA: Insufficient documentation

## 2019-04-01 DIAGNOSIS — J90 Pleural effusion, not elsewhere classified: Secondary | ICD-10-CM | POA: Diagnosis not present

## 2019-04-01 DIAGNOSIS — Z5111 Encounter for antineoplastic chemotherapy: Secondary | ICD-10-CM

## 2019-04-01 DIAGNOSIS — Z8719 Personal history of other diseases of the digestive system: Secondary | ICD-10-CM | POA: Insufficient documentation

## 2019-04-01 DIAGNOSIS — R1310 Dysphagia, unspecified: Secondary | ICD-10-CM | POA: Insufficient documentation

## 2019-04-01 LAB — CMP (CANCER CENTER ONLY)
ALT: 41 U/L (ref 0–44)
AST: 30 U/L (ref 15–41)
Albumin: 3.4 g/dL — ABNORMAL LOW (ref 3.5–5.0)
Alkaline Phosphatase: 142 U/L — ABNORMAL HIGH (ref 38–126)
Anion gap: 11 (ref 5–15)
BUN: 16 mg/dL (ref 8–23)
CO2: 23 mmol/L (ref 22–32)
Calcium: 9.1 mg/dL (ref 8.9–10.3)
Chloride: 106 mmol/L (ref 98–111)
Creatinine: 0.72 mg/dL (ref 0.44–1.00)
GFR, Est AFR Am: >60 mL/min (ref >60)
GFR, Estimated: >60 mL/min (ref >60)
Glucose, Bld: 98 mg/dL (ref 70–99)
Potassium: 4 mmol/L (ref 3.5–5.1)
Sodium: 140 mmol/L (ref 135–145)
Total Bilirubin: 0.8 mg/dL (ref 0.3–1.2)
Total Protein: 6.9 g/dL (ref 6.5–8.1)

## 2019-04-01 LAB — CBC WITH DIFFERENTIAL (CANCER CENTER ONLY)
Abs Immature Granulocytes: 0.04 10*3/uL (ref 0.00–0.07)
Basophils Absolute: 0 10*3/uL (ref 0.0–0.1)
Basophils Relative: 1 %
Eosinophils Absolute: 0.2 10*3/uL (ref 0.0–0.5)
Eosinophils Relative: 4 %
HCT: 46.3 % — ABNORMAL HIGH (ref 36.0–46.0)
Hemoglobin: 14.9 g/dL (ref 12.0–15.0)
Immature Granulocytes: 1 %
Lymphocytes Relative: 6 %
Lymphs Abs: 0.3 10*3/uL — ABNORMAL LOW (ref 0.7–4.0)
MCH: 28.8 pg (ref 26.0–34.0)
MCHC: 32.2 g/dL (ref 30.0–36.0)
MCV: 89.6 fL (ref 80.0–100.0)
Monocytes Absolute: 0.3 10*3/uL (ref 0.1–1.0)
Monocytes Relative: 7 %
Neutro Abs: 3.6 10*3/uL (ref 1.7–7.7)
Neutrophils Relative %: 81 %
Platelet Count: 211 10*3/uL (ref 150–400)
RBC: 5.17 MIL/uL — ABNORMAL HIGH (ref 3.87–5.11)
RDW: 14.5 % (ref 11.5–15.5)
WBC Count: 4.5 10*3/uL (ref 4.0–10.5)
nRBC: 0 % (ref 0.0–0.2)

## 2019-04-01 MED ORDER — FAMOTIDINE IN NACL 20-0.9 MG/50ML-% IV SOLN
INTRAVENOUS | Status: AC
  Filled 2019-04-01: qty 50

## 2019-04-01 MED ORDER — DIPHENHYDRAMINE HCL 50 MG/ML IJ SOLN
25.0000 mg | Freq: Once | INTRAMUSCULAR | Status: AC
  Administered 2019-04-01: 25 mg via INTRAVENOUS

## 2019-04-01 MED ORDER — SODIUM CHLORIDE 0.9 % IV SOLN
190.0000 mg | Freq: Once | INTRAVENOUS | Status: AC
  Administered 2019-04-01: 190 mg via INTRAVENOUS
  Filled 2019-04-01: qty 19

## 2019-04-01 MED ORDER — DIPHENHYDRAMINE HCL 50 MG/ML IJ SOLN
INTRAMUSCULAR | Status: AC
  Filled 2019-04-01: qty 1

## 2019-04-01 MED ORDER — DEXAMETHASONE SODIUM PHOSPHATE 10 MG/ML IJ SOLN
INTRAMUSCULAR | Status: AC
  Filled 2019-04-01: qty 1

## 2019-04-01 MED ORDER — PALONOSETRON HCL INJECTION 0.25 MG/5ML
INTRAVENOUS | Status: AC
  Filled 2019-04-01: qty 5

## 2019-04-01 MED ORDER — FAMOTIDINE IN NACL 20-0.9 MG/50ML-% IV SOLN
20.0000 mg | Freq: Once | INTRAVENOUS | Status: AC
  Administered 2019-04-01: 20 mg via INTRAVENOUS

## 2019-04-01 MED ORDER — SODIUM CHLORIDE 0.9 % IV SOLN
45.0000 mg/m2 | Freq: Once | INTRAVENOUS | Status: AC
  Administered 2019-04-01: 96 mg via INTRAVENOUS
  Filled 2019-04-01: qty 16

## 2019-04-01 MED ORDER — SODIUM CHLORIDE 0.9 % IV SOLN
Freq: Once | INTRAVENOUS | Status: AC
  Filled 2019-04-01: qty 250

## 2019-04-01 MED ORDER — PALONOSETRON HCL INJECTION 0.25 MG/5ML
0.2500 mg | Freq: Once | INTRAVENOUS | Status: AC
  Administered 2019-04-01: 0.25 mg via INTRAVENOUS

## 2019-04-01 MED ORDER — DEXAMETHASONE SODIUM PHOSPHATE 10 MG/ML IJ SOLN
10.0000 mg | Freq: Once | INTRAMUSCULAR | Status: AC
  Administered 2019-04-01: 10 mg via INTRAVENOUS

## 2019-04-01 NOTE — Progress Notes (Signed)
Per dr. Julien Nordmann, patient is OK to treat with lab from 03/25/2019

## 2019-04-01 NOTE — Progress Notes (Signed)
[facility] [phone]   [phone]  OFFICE PROGRESS NOTE  Prince Solian, MD [place] [address]  DIAGNOSIS: stage IIIc/IV (T4, N2, M0//M1a) lNon-small cell carcinoma, poorly differentiated adenocarcinoma,  presented with large right upper lobe lung mass in addition to mediastinal lymphadenopathy and suspicious left upper lobe nodule diagnosed in December 2020.  Molecular studies by [facility]:  [id number] 0.2% Binimetinib  [id number] 4.8% None (VUS) No (VUS)  [id number] 0.2% None (VUS) No (VUS)  PRIOR THERAPY: None.  CURRENT THERAPY: Concurrent chemoradiation with weekly carboplatin for AUC of 2 and paclitaxel 45 mg/M2.  First dose of March 04, 2019.  Status post 4 cycles.  INTERVAL HISTORY: Ashley Parker 86 y.o. female returns to the clinic today for follow-up visit.  The patient is feeling fine today with no concerning complaints except for mild fatigue.  She underwent ultrasound-guided right thoracentesis with drainage of 1.8 L of pleural fluid.  The cytology is still pending.  Her breathing is much better.  She denied having any chest pain, cough or hemoptysis.  She denied having any fever or chills.  She has no nausea, vomiting, diarrhea or constipation.  She denied having any recent weight loss or night sweats.  She continues to tolerate her concurrent chemoradiation fairly well.  She is here today for evaluation before starting cycle #5 of her chemotherapy.  MEDICAL HISTORY: Past Medical History:  Diagnosis Date  . Arthritis   . Basal cell carcinoma (BCC) of skin of face 2016   removed in 2016  . COPD (chronic obstructive pulmonary disease) (HCC)    Mild  . Crohn's ileitis ([facility])    Mild, mostly asymptomatic, not on therapy  . Fundic gland polyposis of stomach   . GERD (gastroesophageal reflux disease)   . Glaucoma   . Hemorrhoids    Internal and External  . History of hiatal hernia   . Hypertension    . Macular degeneration   . Obesity, unspecified   . Osteoporosis   . Pelvic fracture ([facility]) 2007  . Stricture of esophagus    distal  . Unspecified vitamin D deficiency     ALLERGIES:  is allergic to wasp venom; biaxin [clarithromycin]; and brimonidine.  MEDICATIONS:  Current Outpatient Medications  Medication Sig Dispense Refill  . albuterol (VENTOLIN HFA) 108 (90 Base) MCG/ACT inhaler Inhale 1 puff into the lungs as needed for wheezing or shortness of breath.    . Ascorbic Acid (VITAMIN C) 500 MG CHEW Chew 500 mg by mouth daily.    . B Complex-C (SUPER B COMPLEX PO) Take 1 tablet by mouth daily.    . Calcium Carb-Cholecalciferol (CALCIUM 600+D3 PO) Take 1-2 tablets by mouth See admin instructions. Take 1 tablet in the morning & 2 tablet at night    . calcium elemental as carbonate (BARIATRIC TUMS ULTRA) 400 MG chewable tablet Chew 2,000 mg by mouth at bedtime.    . Cholecalciferol (VITAMIN D3) 2000 units TABS Take 2,000 Units by mouth daily.     . diclofenac Sodium (VOLTAREN) 1 % GEL Apply 1 application topically 4 (four) times daily as needed (pain.).     [name] dorzolamide (TRUSOPT) 2 % ophthalmic solution Place 1 drop into the left eye 2 (two) times daily.     [name] EPINEPHrine 0.3 mg/0.3 mL IJ SOAJ injection Inject 0.3 mg into the muscle as needed (for allergic reaction).    . Flaxseed, Linseed, (FLAXSEED OIL PO) Take 1 tablet by  mouth daily.    . fluticasone (FLONASE) 50 MCG/ACT nasal spray Place 1 spray into both nostrils daily.     . irbesartan (AVAPRO) 300 MG tablet Take 300 mg by mouth daily.     [name] LUMIGAN 0.01 % SOLN Place 1 drop into the right eye at bedtime.   4  . Magnesium 250 MG TABS Take 250 mg by mouth daily.    . Multiple Vitamins-Minerals (PRESERVISION AREDS 2 PO) Take 1 tablet by mouth 2 (two) times daily.    . Omega-3 Fatty Acids (FISH OIL) 1000 MG CAPS Take 1,000 mg by mouth daily.    Vladimir Faster Glycol-Propyl Glycol (SYSTANE) 0.4-0.3 % SOLN Place 1 drop into both eyes  3 (three) times daily as needed (for tired/dry eyes.).     [name] prochlorperazine (COMPAZINE) 10 MG tablet Take 1 tablet (10 mg total) by mouth every 6 (six) hours as needed for nausea or vomiting. 30 tablet 0  . sucralfate (CARAFATE) 1 g tablet Take 1 tablet (1 g total) by mouth 4 (four) times daily -  with meals and at bedtime. Dissolve in 10 mL of warm water prior to swallowing 120 tablet 1  . timolol (TIMOPTIC) 0.5 % ophthalmic solution Place 1 drop into the left eye 2 (two) times daily.    . TURMERIC PO Take 1,500 mg by mouth daily.    . vitamin B-12 (CYANOCOBALAMIN) 1000 MCG tablet Take 1,000 mcg by mouth daily.    . vitamin E 400 UNIT capsule Take 400 Units by mouth daily. (180 mg)    . Zinc 30 MG TABS Take 30 mg by mouth daily.     No current facility-administered medications for this visit.    SURGICAL HISTORY:  Past Surgical History:  Procedure Laterality Date  . APPENDECTOMY    . BACK SURGERY    . CATARACT EXTRACTION    . COLONOSCOPY  07/27/2001, 07/08/2011   Multiple  . ESOPHAGEAL DILATION  2013  . ESOPHAGOGASTRODUODENOSCOPY     multiple  . LAPAROSCOPIC APPENDECTOMY N/A 02/19/2014   Procedure: APPENDECTOMY LAPAROSCOPIC;  Surgeon: Erroll Luna, MD;  Location: [facility];  Service: General;  Laterality: N/A;  . MINOR PLACEMENT OF FIDUCIAL  08/23/2017   Procedure: MINOR PLACEMENT OF FIDUCIAL - Left Upper Lobe Lung x2, Right Upper Lobe Lung x3;  Surgeon: Collene Gobble, MD;  Location: [facility];  Service: Thoracic;;  . TONSILLECTOMY AND ADENOIDECTOMY     and adenoids   . TOTAL KNEE ARTHROPLASTY Left 2007  . VIDEO BRONCHOSCOPY WITH ENDOBRONCHIAL NAVIGATION N/A 08/23/2017   Procedure: VIDEO BRONCHOSCOPY WITH ENDOBRONCHIAL NAVIGATION;  Surgeon: Collene Gobble, MD;  Location: [facility];  Service: Thoracic;  Laterality: N/A;  . VIDEO BRONCHOSCOPY WITH ENDOBRONCHIAL NAVIGATION N/A 01/31/2019   Procedure: VIDEO BRONCHOSCOPY WITH ENDOBRONCHIAL NAVIGATION;  Surgeon: Garner Nash, DO;  Location: [facility];  Service: Thoracic;  Laterality: N/A;  . VIDEO BRONCHOSCOPY WITH ENDOBRONCHIAL ULTRASOUND N/A 01/31/2019   Procedure: VIDEO BRONCHOSCOPY WITH ENDOBRONCHIAL ULTRASOUND;  Surgeon: Garner Nash, DO;  Location: [facility];  Service: Thoracic;  Laterality: N/A;  . WRIST FRACTURE SURGERY Left 2018   with screws    REVIEW OF SYSTEMS:  A comprehensive review of systems was negative except for: Constitutional: positive for fatigue Respiratory: positive for dyspnea on exertion   PHYSICAL EXAMINATION: General appearance: alert, cooperative and no distress Head: Normocephalic, without obvious abnormality, atraumatic Neck: no adenopathy, no JVD, supple, symmetrical, trachea midline and thyroid not enlarged, symmetric, no tenderness/mass/nodules Lymph nodes:  Cervical, supraclavicular, and axillary nodes normal. Resp: clear to auscultation bilaterally Back: symmetric, no curvature. ROM normal. No CVA tenderness. Cardio: regular rate and rhythm, S1, S2 normal, no murmur, click, rub or gallop GI: soft, non-tender; bowel sounds normal; no masses,  no organomegaly Extremities: extremities normal, atraumatic, no cyanosis or edema  ECOG PERFORMANCE STATUS: 1 - Symptomatic but completely ambulatory  Blood pressure (!) 141/72, pulse 88, temperature 98.2 F (36.8 C), temperature source Temporal, resp. rate 18, height 5' 5"  (1.651 m), weight 215 lb 4.8 oz (97.7 kg), SpO2 94 %.  LABORATORY DATA: Lab Results  Component Value Date   WBC 4.3 03/25/2019   HGB 14.8 03/25/2019   HCT 45.1 03/25/2019   MCV 88.4 03/25/2019   PLT 290 03/25/2019      Chemistry      Component Value Date/Time   NA 139 03/25/2019 1356   K 3.8 03/25/2019 1356   CL 107 03/25/2019 1356   CO2 21 (L) 03/25/2019 1356   BUN 14 03/25/2019 1356   CREATININE 0.71 03/25/2019 1356      Component Value Date/Time   CALCIUM 9.2 03/25/2019 1356   ALKPHOS 141 (H) 03/25/2019 1356   AST 24 03/25/2019 1356   ALT 40 03/25/2019 1356   BILITOT  0.7 03/25/2019 1356       RADIOGRAPHIC STUDIES: DG Chest 1 View  Result Date: 03/29/2019 CLINICAL DATA:  Status post thoracentesis. EXAM: CHEST  1 VIEW COMPARISON:  Chest x-ray dated February 01, 2019. FINDINGS: The heart size and mediastinal contours are within normal limits. Normal pulmonary vascularity. Trace right pleural effusion. No pneumothorax. Unchanged right upper lobe mass/consolidation with fiducial markers in place. Unchanged small nodule in the left upper lobe with fiducial markers in place. No acute osseous abnormality. IMPRESSION: 1. Trace right pleural effusion.  No pneumothorax. 2. Unchanged right upper lobe mass/consolidation and left upper lobe nodule. Electronically Signed   By: Titus Dubin [name]   On: 03/29/2019 11:22   US Thoracentesis Asp Pleural space w/IMG guide  Result Date: 03/29/2019 INDICATION: Patient with history of right lung adenocarcinoma, dyspnea, right pleural effusion. Request made for diagnostic and therapeutic right thoracentesis. EXAM: ULTRASOUND GUIDED DIAGNOSTIC AND THERAPEUTIC RIGHT THORACENTESIS MEDICATIONS: None COMPLICATIONS: None immediate. PROCEDURE: An ultrasound guided thoracentesis was thoroughly discussed with the patient and questions answered. The benefits, risks, alternatives and complications were also discussed. The patient understands and wishes to proceed with the procedure. Written consent was obtained. Ultrasound was performed to localize and mark an adequate pocket of fluid in the right chest. The area was then prepped and draped in the normal sterile fashion. 1% Lidocaine was used for local anesthesia. Under ultrasound guidance a 6 Fr Safe-T-Centesis catheter was introduced. Thoracentesis was performed. The catheter was removed and a dressing applied. FINDINGS: A total of approximately 1.8 liters of yellow fluid was removed. Samples were sent to the laboratory as requested by the clinical team. IMPRESSION: Successful ultrasound guided  diagnostic and therapeutic right thoracentesis yielding 1.8 liters of pleural fluid. Read by: Rowe Robert, PA-C Electronically Signed   By: Jerilynn Mages.  Shick [name]   On: 03/29/2019 12:17    ASSESSMENT AND PLAN: This is a very pleasant 86 years old white female with a stage IIIc/IV non-small cell lung cancer, poorly differentiated adenocarcinoma presented with right upper lobe lung mass in addition to mediastinal lymphadenopathy diagnosed in December 2020. Molecular studies by [facility] showed no actionable mutations. She is currently undergoing a course of concurrent chemoradiation with weekly carboplatin and  paclitaxel status post 4 cycles.   The patient has been tolerating this treatment well with no concerning adverse effects except for mild fatigue. I recommended for her to proceed with cycle #5 today as planned. I will see the patient back for follow-up visit in 2 weeks for evaluation before starting cycle #7. For hypertension she will continue with her current blood pressure medication and monitor it closely at home. The patient was advised to call immediately if she has any concerning symptoms in the interval. The patient voices understanding of current disease status and treatment options and is in agreement with the current care plan. All questions were answered. The patient knows to call the clinic with any problems, questions or concerns. We can certainly see the patient much sooner if necessary.   Disclaimer: This note was dictated with voice recognition software. Similar sounding words can inadvertently be transcribed and may not be corrected upon review.

## 2019-04-01 NOTE — Patient Instructions (Signed)
[facility] Discharge Instructions for Patients Receiving Chemotherapy  Today you received the following chemotherapy agents Taxol and Carboplatin.   To help prevent nausea and vomiting after your treatment, we encourage you to take your nausea medication as directed BUT NO ZOFRAN FOR 3 DAYS AFTER CHEMO.   If you develop nausea and vomiting that is not controlled by your nausea medication, call the clinic.   BELOW ARE SYMPTOMS THAT SHOULD BE REPORTED IMMEDIATELY:  *FEVER GREATER THAN 100.5 F  *CHILLS WITH OR WITHOUT FEVER  NAUSEA AND VOMITING THAT IS NOT CONTROLLED WITH YOUR NAUSEA MEDICATION  *UNUSUAL SHORTNESS OF BREATH  *UNUSUAL BRUISING OR BLEEDING  TENDERNESS IN MOUTH AND THROAT WITH OR WITHOUT PRESENCE OF ULCERS  *URINARY PROBLEMS  *BOWEL PROBLEMS  UNUSUAL RASH Items with * indicate a potential emergency and should be followed up as soon as possible.  Feel free to call the clinic you have any questions or concerns. The clinic phone number is [phone].  Please show the [facility] at check-in to the Emergency Department and triage nurse.

## 2019-04-02 ENCOUNTER — Other Ambulatory Visit: Payer: Self-pay

## 2019-04-02 ENCOUNTER — Ambulatory Visit
Admission: RE | Admit: 2019-04-02 | Discharge: 2019-04-02 | Disposition: A | Discharge status: Home / Self Care | Payer: PPO | Source: Ambulatory Visit | Attending: Radiation Oncology | Admitting: Radiation Oncology

## 2019-04-02 DIAGNOSIS — C3411 Malignant neoplasm of upper lobe, right bronchus or lung: Secondary | ICD-10-CM | POA: Diagnosis not present

## 2019-04-02 DIAGNOSIS — C3491 Malignant neoplasm of unspecified part of right bronchus or lung: Secondary | ICD-10-CM | POA: Diagnosis not present

## 2019-04-03 ENCOUNTER — Ambulatory Visit
Admission: RE | Admit: 2019-04-03 | Discharge: 2019-04-03 | Disposition: A | Discharge status: Home / Self Care | Payer: PPO | Source: Ambulatory Visit | Attending: Radiation Oncology | Admitting: Radiation Oncology

## 2019-04-03 ENCOUNTER — Other Ambulatory Visit: Payer: Self-pay

## 2019-04-03 DIAGNOSIS — C3491 Malignant neoplasm of unspecified part of right bronchus or lung: Secondary | ICD-10-CM | POA: Diagnosis not present

## 2019-04-03 DIAGNOSIS — C3411 Malignant neoplasm of upper lobe, right bronchus or lung: Secondary | ICD-10-CM | POA: Diagnosis not present

## 2019-04-03 LAB — CYTOLOGY - NON PAP

## 2019-04-04 ENCOUNTER — Other Ambulatory Visit: Payer: Self-pay

## 2019-04-04 ENCOUNTER — Ambulatory Visit
Admission: RE | Admit: 2019-04-04 | Discharge: 2019-04-04 | Disposition: A | Discharge status: Home / Self Care | Payer: PPO | Source: Ambulatory Visit | Attending: Radiation Oncology | Admitting: Radiation Oncology

## 2019-04-04 DIAGNOSIS — C3491 Malignant neoplasm of unspecified part of right bronchus or lung: Secondary | ICD-10-CM | POA: Diagnosis not present

## 2019-04-04 DIAGNOSIS — C3411 Malignant neoplasm of upper lobe, right bronchus or lung: Secondary | ICD-10-CM | POA: Diagnosis not present

## 2019-04-05 ENCOUNTER — Ambulatory Visit
Admission: RE | Admit: 2019-04-05 | Discharge: 2019-04-05 | Disposition: A | Discharge status: Home / Self Care | Payer: PPO | Source: Ambulatory Visit | Attending: Radiation Oncology | Admitting: Radiation Oncology

## 2019-04-05 ENCOUNTER — Other Ambulatory Visit: Payer: Self-pay

## 2019-04-05 DIAGNOSIS — C3411 Malignant neoplasm of upper lobe, right bronchus or lung: Secondary | ICD-10-CM | POA: Diagnosis not present

## 2019-04-05 DIAGNOSIS — C3491 Malignant neoplasm of unspecified part of right bronchus or lung: Secondary | ICD-10-CM | POA: Diagnosis not present

## 2019-04-08 ENCOUNTER — Other Ambulatory Visit: Payer: Self-pay

## 2019-04-08 ENCOUNTER — Inpatient Hospital Stay: Payer: PPO

## 2019-04-08 ENCOUNTER — Ambulatory Visit
Admission: RE | Admit: 2019-04-08 | Discharge: 2019-04-08 | Disposition: A | Discharge status: Home / Self Care | Payer: PPO | Source: Ambulatory Visit | Attending: Radiation Oncology | Admitting: Radiation Oncology

## 2019-04-08 VITALS — BP 121/64 | HR 92 | Temp 98.8°F | Resp 18

## 2019-04-08 DIAGNOSIS — C3491 Malignant neoplasm of unspecified part of right bronchus or lung: Secondary | ICD-10-CM | POA: Diagnosis not present

## 2019-04-08 DIAGNOSIS — Z5111 Encounter for antineoplastic chemotherapy: Secondary | ICD-10-CM | POA: Diagnosis not present

## 2019-04-08 DIAGNOSIS — C3411 Malignant neoplasm of upper lobe, right bronchus or lung: Secondary | ICD-10-CM | POA: Diagnosis not present

## 2019-04-08 LAB — CBC WITH DIFFERENTIAL (CANCER CENTER ONLY)
Abs Immature Granulocytes: 0.01 10*3/uL (ref 0.00–0.07)
Basophils Absolute: 0 10*3/uL (ref 0.0–0.1)
Basophils Relative: 1 %
Eosinophils Absolute: 0.2 10*3/uL (ref 0.0–0.5)
Eosinophils Relative: 4 %
HCT: 41.2 % (ref 36.0–46.0)
Hemoglobin: 13.9 g/dL (ref 12.0–15.0)
Immature Granulocytes: 0 %
Lymphocytes Relative: 5 %
Lymphs Abs: 0.2 10*3/uL — ABNORMAL LOW (ref 0.7–4.0)
MCH: 29.7 pg (ref 26.0–34.0)
MCHC: 33.7 g/dL (ref 30.0–36.0)
MCV: 88 fL (ref 80.0–100.0)
Monocytes Absolute: 0.2 10*3/uL (ref 0.1–1.0)
Monocytes Relative: 6 %
Neutro Abs: 3.2 10*3/uL (ref 1.7–7.7)
Neutrophils Relative %: 84 %
Platelet Count: 186 10*3/uL (ref 150–400)
RBC: 4.68 MIL/uL (ref 3.87–5.11)
RDW: 14.7 % (ref 11.5–15.5)
WBC Count: 3.8 10*3/uL — ABNORMAL LOW (ref 4.0–10.5)
nRBC: 0 % (ref 0.0–0.2)

## 2019-04-08 LAB — CMP (CANCER CENTER ONLY)
ALT: 54 U/L — ABNORMAL HIGH (ref 0–44)
AST: 40 U/L (ref 15–41)
Albumin: 3.4 g/dL — ABNORMAL LOW (ref 3.5–5.0)
Alkaline Phosphatase: 169 U/L — ABNORMAL HIGH (ref 38–126)
Anion gap: 11 (ref 5–15)
BUN: 12 mg/dL (ref 8–23)
CO2: 22 mmol/L (ref 22–32)
Calcium: 8.7 mg/dL — ABNORMAL LOW (ref 8.9–10.3)
Chloride: 106 mmol/L (ref 98–111)
Creatinine: 0.66 mg/dL (ref 0.44–1.00)
GFR, Est AFR Am: >60 mL/min (ref >60)
GFR, Estimated: >60 mL/min (ref >60)
Glucose, Bld: 116 mg/dL — ABNORMAL HIGH (ref 70–99)
Potassium: 4 mmol/L (ref 3.5–5.1)
Sodium: 139 mmol/L (ref 135–145)
Total Bilirubin: 1 mg/dL (ref 0.3–1.2)
Total Protein: 6.8 g/dL (ref 6.5–8.1)

## 2019-04-08 MED ORDER — DEXAMETHASONE SODIUM PHOSPHATE 10 MG/ML IJ SOLN
INTRAMUSCULAR | Status: AC
  Filled 2019-04-08: qty 1

## 2019-04-08 MED ORDER — SODIUM CHLORIDE 0.9 % IV SOLN
Freq: Once | INTRAVENOUS | Status: AC
  Filled 2019-04-08: qty 250

## 2019-04-08 MED ORDER — PALONOSETRON HCL INJECTION 0.25 MG/5ML
INTRAVENOUS | Status: AC
  Filled 2019-04-08: qty 5

## 2019-04-08 MED ORDER — FAMOTIDINE IN NACL 20-0.9 MG/50ML-% IV SOLN
INTRAVENOUS | Status: AC
  Filled 2019-04-08: qty 50

## 2019-04-08 MED ORDER — PALONOSETRON HCL INJECTION 0.25 MG/5ML
0.2500 mg | Freq: Once | INTRAVENOUS | Status: AC
  Administered 2019-04-08: 15:00:00 0.25 mg via INTRAVENOUS

## 2019-04-08 MED ORDER — SODIUM CHLORIDE 0.9 % IV SOLN
185.0000 mg | Freq: Once | INTRAVENOUS | Status: AC
  Administered 2019-04-08: 190 mg via INTRAVENOUS
  Filled 2019-04-08: qty 19

## 2019-04-08 MED ORDER — DEXAMETHASONE SODIUM PHOSPHATE 10 MG/ML IJ SOLN
10.0000 mg | Freq: Once | INTRAMUSCULAR | Status: AC
  Administered 2019-04-08: 10 mg via INTRAVENOUS

## 2019-04-08 MED ORDER — DIPHENHYDRAMINE HCL 50 MG/ML IJ SOLN
INTRAMUSCULAR | Status: AC
  Filled 2019-04-08: qty 1

## 2019-04-08 MED ORDER — SODIUM CHLORIDE 0.9 % IV SOLN
45.0000 mg/m2 | Freq: Once | INTRAVENOUS | Status: AC
  Administered 2019-04-08: 96 mg via INTRAVENOUS
  Filled 2019-04-08: qty 16

## 2019-04-08 MED ORDER — DIPHENHYDRAMINE HCL 50 MG/ML IJ SOLN
25.0000 mg | Freq: Once | INTRAMUSCULAR | Status: AC
  Administered 2019-04-08: 25 mg via INTRAVENOUS

## 2019-04-08 MED ORDER — FAMOTIDINE IN NACL 20-0.9 MG/50ML-% IV SOLN
20.0000 mg | Freq: Once | INTRAVENOUS | Status: AC
  Administered 2019-04-08: 20 mg via INTRAVENOUS

## 2019-04-08 NOTE — Patient Instructions (Signed)
[facility] Discharge Instructions for Patients Receiving Chemotherapy  Today you received the following chemotherapy agents Taxol and Carboplatin.   To help prevent nausea and vomiting after your treatment, we encourage you to take your nausea medication as directed BUT NO ZOFRAN FOR 3 DAYS AFTER CHEMO.   If you develop nausea and vomiting that is not controlled by your nausea medication, call the clinic.   BELOW ARE SYMPTOMS THAT SHOULD BE REPORTED IMMEDIATELY:  *FEVER GREATER THAN 100.5 F  *CHILLS WITH OR WITHOUT FEVER  NAUSEA AND VOMITING THAT IS NOT CONTROLLED WITH YOUR NAUSEA MEDICATION  *UNUSUAL SHORTNESS OF BREATH  *UNUSUAL BRUISING OR BLEEDING  TENDERNESS IN MOUTH AND THROAT WITH OR WITHOUT PRESENCE OF ULCERS  *URINARY PROBLEMS  *BOWEL PROBLEMS  UNUSUAL RASH Items with * indicate a potential emergency and should be followed up as soon as possible.  Feel free to call the clinic you have any questions or concerns. The clinic phone number is [phone].  Please show the [facility] at check-in to the Emergency Department and triage nurse.

## 2019-04-09 ENCOUNTER — Other Ambulatory Visit: Payer: Self-pay

## 2019-04-09 ENCOUNTER — Ambulatory Visit
Admission: RE | Admit: 2019-04-09 | Discharge: 2019-04-09 | Disposition: A | Discharge status: Home / Self Care | Payer: PPO | Source: Ambulatory Visit | Attending: Radiation Oncology | Admitting: Radiation Oncology

## 2019-04-09 DIAGNOSIS — C3411 Malignant neoplasm of upper lobe, right bronchus or lung: Secondary | ICD-10-CM | POA: Diagnosis not present

## 2019-04-09 DIAGNOSIS — C3491 Malignant neoplasm of unspecified part of right bronchus or lung: Secondary | ICD-10-CM | POA: Diagnosis not present

## 2019-04-09 LAB — NOCARDIA SUSCEPTIBILITY BROTH
Ciprofloxacin: >4
Moxifloxacin: 4
Tobramycin: 16

## 2019-04-09 LAB — AFB ORGANISM ID BY DNA PROBE
M avium complex: NEGATIVE
M tuberculosis complex: NEGATIVE

## 2019-04-09 LAB — FUNGAL ORGANISM REFLEX

## 2019-04-09 LAB — ACID FAST CULTURE WITH REFLEXED SENSITIVITIES (MYCOBACTERIA): Acid Fast Culture: POSITIVE — AB

## 2019-04-09 LAB — FUNGUS CULTURE WITH STAIN

## 2019-04-09 LAB — ACID FAST SMEAR (AFB, MYCOBACTERIA): Acid Fast Smear: NEGATIVE

## 2019-04-09 LAB — FUNGUS CULTURE RESULT

## 2019-04-09 LAB — ORG ID BY SEQUENCING RFLX AST

## 2019-04-10 ENCOUNTER — Other Ambulatory Visit: Payer: Self-pay

## 2019-04-10 ENCOUNTER — Ambulatory Visit
Admission: RE | Admit: 2019-04-10 | Discharge: 2019-04-10 | Disposition: A | Discharge status: Home / Self Care | Payer: PPO | Source: Ambulatory Visit | Attending: Radiation Oncology | Admitting: Radiation Oncology

## 2019-04-10 DIAGNOSIS — C3411 Malignant neoplasm of upper lobe, right bronchus or lung: Secondary | ICD-10-CM | POA: Diagnosis not present

## 2019-04-10 DIAGNOSIS — C3491 Malignant neoplasm of unspecified part of right bronchus or lung: Secondary | ICD-10-CM | POA: Diagnosis not present

## 2019-04-10 LAB — MISC LABCORP TEST (SEND OUT): Labcorp test code: 183802

## 2019-04-10 LAB — MAC SUSCEPTIBILITY BROTH

## 2019-04-11 ENCOUNTER — Other Ambulatory Visit: Payer: Self-pay

## 2019-04-11 ENCOUNTER — Ambulatory Visit
Admission: RE | Admit: 2019-04-11 | Discharge: 2019-04-11 | Disposition: A | Discharge status: Home / Self Care | Payer: PPO | Source: Ambulatory Visit | Attending: Radiation Oncology | Admitting: Radiation Oncology

## 2019-04-11 DIAGNOSIS — C3491 Malignant neoplasm of unspecified part of right bronchus or lung: Secondary | ICD-10-CM | POA: Diagnosis not present

## 2019-04-11 DIAGNOSIS — C3411 Malignant neoplasm of upper lobe, right bronchus or lung: Secondary | ICD-10-CM | POA: Diagnosis not present

## 2019-04-12 ENCOUNTER — Other Ambulatory Visit: Payer: Self-pay

## 2019-04-12 ENCOUNTER — Ambulatory Visit
Admission: RE | Admit: 2019-04-12 | Discharge: 2019-04-12 | Disposition: A | Discharge status: Home / Self Care | Payer: PPO | Source: Ambulatory Visit | Attending: Radiation Oncology | Admitting: Radiation Oncology

## 2019-04-12 DIAGNOSIS — C3411 Malignant neoplasm of upper lobe, right bronchus or lung: Secondary | ICD-10-CM | POA: Diagnosis not present

## 2019-04-12 DIAGNOSIS — C3491 Malignant neoplasm of unspecified part of right bronchus or lung: Secondary | ICD-10-CM | POA: Diagnosis not present

## 2019-04-15 ENCOUNTER — Inpatient Hospital Stay: Payer: PPO

## 2019-04-15 ENCOUNTER — Other Ambulatory Visit: Payer: Self-pay

## 2019-04-15 ENCOUNTER — Telehealth: Payer: Self-pay | Admitting: Medical Oncology

## 2019-04-15 ENCOUNTER — Ambulatory Visit
Admission: RE | Admit: 2019-04-15 | Discharge: 2019-04-15 | Disposition: A | Discharge status: Home / Self Care | Payer: PPO | Source: Ambulatory Visit | Attending: Radiation Oncology | Admitting: Radiation Oncology

## 2019-04-15 ENCOUNTER — Encounter: Payer: Self-pay | Admitting: Internal Medicine

## 2019-04-15 ENCOUNTER — Inpatient Hospital Stay (HOSPITAL_BASED_OUTPATIENT_CLINIC_OR_DEPARTMENT_OTHER): Payer: PPO | Admitting: Internal Medicine

## 2019-04-15 VITALS — BP 151/86 | HR 99 | Temp 98.0°F | Resp 18 | Ht 65.0 in | Wt 212.7 lb

## 2019-04-15 DIAGNOSIS — Z5111 Encounter for antineoplastic chemotherapy: Secondary | ICD-10-CM

## 2019-04-15 DIAGNOSIS — I10 Essential (primary) hypertension: Secondary | ICD-10-CM

## 2019-04-15 DIAGNOSIS — C3491 Malignant neoplasm of unspecified part of right bronchus or lung: Secondary | ICD-10-CM

## 2019-04-15 DIAGNOSIS — C3411 Malignant neoplasm of upper lobe, right bronchus or lung: Secondary | ICD-10-CM | POA: Diagnosis not present

## 2019-04-15 DIAGNOSIS — C3490 Malignant neoplasm of unspecified part of unspecified bronchus or lung: Secondary | ICD-10-CM

## 2019-04-15 LAB — CMP (CANCER CENTER ONLY)
ALT: 44 U/L (ref 0–44)
AST: 29 U/L (ref 15–41)
Albumin: 3.7 g/dL (ref 3.5–5.0)
Alkaline Phosphatase: 140 U/L — ABNORMAL HIGH (ref 38–126)
Anion gap: 11 (ref 5–15)
BUN: 16 mg/dL (ref 8–23)
CO2: 24 mmol/L (ref 22–32)
Calcium: 9.6 mg/dL (ref 8.9–10.3)
Chloride: 106 mmol/L (ref 98–111)
Creatinine: 0.73 mg/dL (ref 0.44–1.00)
GFR, Est AFR Am: >60 mL/min (ref >60)
GFR, Estimated: >60 mL/min (ref >60)
Glucose, Bld: 126 mg/dL — ABNORMAL HIGH (ref 70–99)
Potassium: 4 mmol/L (ref 3.5–5.1)
Sodium: 141 mmol/L (ref 135–145)
Total Bilirubin: 0.9 mg/dL (ref 0.3–1.2)
Total Protein: 7.3 g/dL (ref 6.5–8.1)

## 2019-04-15 LAB — CBC WITH DIFFERENTIAL (CANCER CENTER ONLY)
Abs Immature Granulocytes: 0.01 10*3/uL (ref 0.00–0.07)
Basophils Absolute: 0 10*3/uL (ref 0.0–0.1)
Basophils Relative: 1 %
Eosinophils Absolute: 0.1 10*3/uL (ref 0.0–0.5)
Eosinophils Relative: 4 %
HCT: 41.2 % (ref 36.0–46.0)
Hemoglobin: 13.7 g/dL (ref 12.0–15.0)
Immature Granulocytes: 1 %
Lymphocytes Relative: 10 %
Lymphs Abs: 0.2 10*3/uL — ABNORMAL LOW (ref 0.7–4.0)
MCH: 29.2 pg (ref 26.0–34.0)
MCHC: 33.3 g/dL (ref 30.0–36.0)
MCV: 87.8 fL (ref 80.0–100.0)
Monocytes Absolute: 0.1 10*3/uL (ref 0.1–1.0)
Monocytes Relative: 7 %
Neutro Abs: 1.6 10*3/uL — ABNORMAL LOW (ref 1.7–7.7)
Neutrophils Relative %: 77 %
Platelet Count: 210 10*3/uL (ref 150–400)
RBC: 4.69 MIL/uL (ref 3.87–5.11)
RDW: 15.1 % (ref 11.5–15.5)
WBC Count: 2 10*3/uL — ABNORMAL LOW (ref 4.0–10.5)
nRBC: 0 % (ref 0.0–0.2)

## 2019-04-15 MED ORDER — FAMOTIDINE IN NACL 20-0.9 MG/50ML-% IV SOLN
INTRAVENOUS | Status: AC
  Filled 2019-04-15: qty 50

## 2019-04-15 MED ORDER — SODIUM CHLORIDE 0.9 % IV SOLN
Freq: Once | INTRAVENOUS | Status: AC
  Filled 2019-04-15: qty 250

## 2019-04-15 MED ORDER — DIPHENHYDRAMINE HCL 50 MG/ML IJ SOLN
INTRAMUSCULAR | Status: AC
  Filled 2019-04-15: qty 1

## 2019-04-15 MED ORDER — PALONOSETRON HCL INJECTION 0.25 MG/5ML
0.2500 mg | Freq: Once | INTRAVENOUS | Status: AC
  Administered 2019-04-15: 0.25 mg via INTRAVENOUS

## 2019-04-15 MED ORDER — DIPHENHYDRAMINE HCL 50 MG/ML IJ SOLN
25.0000 mg | Freq: Once | INTRAMUSCULAR | Status: AC
  Administered 2019-04-15: 25 mg via INTRAVENOUS

## 2019-04-15 MED ORDER — FAMOTIDINE IN NACL 20-0.9 MG/50ML-% IV SOLN
20.0000 mg | Freq: Once | INTRAVENOUS | Status: AC
  Administered 2019-04-15: 20 mg via INTRAVENOUS

## 2019-04-15 MED ORDER — SODIUM CHLORIDE 0.9 % IV SOLN
185.0000 mg | Freq: Once | INTRAVENOUS | Status: AC
  Administered 2019-04-15: 190 mg via INTRAVENOUS
  Filled 2019-04-15: qty 19

## 2019-04-15 MED ORDER — PALONOSETRON HCL INJECTION 0.25 MG/5ML
INTRAVENOUS | Status: AC
  Filled 2019-04-15: qty 5

## 2019-04-15 MED ORDER — DEXAMETHASONE SODIUM PHOSPHATE 10 MG/ML IJ SOLN
10.0000 mg | Freq: Once | INTRAMUSCULAR | Status: AC
  Administered 2019-04-15: 12:00:00 10 mg via INTRAVENOUS

## 2019-04-15 MED ORDER — DEXAMETHASONE SODIUM PHOSPHATE 10 MG/ML IJ SOLN
INTRAMUSCULAR | Status: AC
  Filled 2019-04-15: qty 1

## 2019-04-15 MED ORDER — SODIUM CHLORIDE 0.9 % IV SOLN
45.0000 mg/m2 | Freq: Once | INTRAVENOUS | Status: AC
  Administered 2019-04-15: 96 mg via INTRAVENOUS
  Filled 2019-04-15: qty 16

## 2019-04-15 NOTE — Patient Instructions (Signed)
[facility] Discharge Instructions for Patients Receiving Chemotherapy  Today you received the following chemotherapy agents Taxol and Carboplatin.   To help prevent nausea and vomiting after your treatment, we encourage you to take your nausea medication as directed BUT NO ZOFRAN FOR 3 DAYS AFTER CHEMO.   If you develop nausea and vomiting that is not controlled by your nausea medication, call the clinic.   BELOW ARE SYMPTOMS THAT SHOULD BE REPORTED IMMEDIATELY:  *FEVER GREATER THAN 100.5 F  *CHILLS WITH OR WITHOUT FEVER  NAUSEA AND VOMITING THAT IS NOT CONTROLLED WITH YOUR NAUSEA MEDICATION  *UNUSUAL SHORTNESS OF BREATH  *UNUSUAL BRUISING OR BLEEDING  TENDERNESS IN MOUTH AND THROAT WITH OR WITHOUT PRESENCE OF ULCERS  *URINARY PROBLEMS  *BOWEL PROBLEMS  UNUSUAL RASH Items with * indicate a potential emergency and should be followed up as soon as possible.  Feel free to call the clinic you have any questions or concerns. The clinic phone number is [phone].  Please show the [facility] at check-in to the Emergency Department and triage nurse.

## 2019-04-15 NOTE — Telephone Encounter (Signed)
Unable to connect on phone with pt - Spouse or daughter .

## 2019-04-15 NOTE — Progress Notes (Signed)
[place] [phone]   [phone]  OFFICE PROGRESS NOTE  Prince Solian, MD [place] [address]  DIAGNOSIS: stage IIIc/IV (T4, N2, M0//M1a) lNon-small cell carcinoma, poorly differentiated adenocarcinoma,  presented with large right upper lobe lung mass in addition to mediastinal lymphadenopathy and suspicious left upper lobe nodule diagnosed in December 2020.  Molecular studies by [facility]:  [id number] 0.2% Binimetinib  [id number] 4.8% None (VUS) No (VUS)  [id number] 0.2% None (VUS) No (VUS)  PRIOR THERAPY: None.  CURRENT THERAPY: Concurrent chemoradiation with weekly carboplatin for AUC of 2 and paclitaxel 45 mg/M2.  First dose of March 04, 2019.  Status post 6 cycles.  INTERVAL HISTORY: Ashley Parker 86 y.o. female returns to the clinic today for follow-up visit.  The patient is feeling fine today with no concerning complaints except for mild dysphagia and cough.  She has shortness of breath with exertion.  She denied having any nausea, vomiting, diarrhea or constipation.  She denied having any headache or visual changes.  She has no recent weight loss or night sweats.  She has been tolerating her treatment with concurrent chemoradiation fairly well.  She is here today for evaluation before starting cycle #7 of her treatment.  MEDICAL HISTORY: Past Medical History:  Diagnosis Date  . Arthritis   . Basal cell carcinoma (BCC) of skin of face 2016   removed in 2016  . COPD (chronic obstructive pulmonary disease) (HCC)    Mild  . Crohn's ileitis ([place])    Mild, mostly asymptomatic, not on therapy  . Fundic gland polyposis of stomach   . GERD (gastroesophageal reflux disease)   . Glaucoma   . Hemorrhoids    Internal and External  . History of hiatal hernia   . Hypertension   . Macular degeneration   . Obesity, unspecified   . Osteoporosis   . Pelvic fracture ([place]) 2007  . Stricture of esophagus    distal  . Unspecified vitamin D deficiency     ALLERGIES:  is allergic to wasp venom; biaxin [clarithromycin]; and brimonidine.  MEDICATIONS:  Current Outpatient Medications  Medication Sig Dispense Refill  . albuterol (VENTOLIN HFA) 108 (90 Base) MCG/ACT inhaler Inhale 1 puff into the lungs as needed for wheezing or shortness of breath.    . Ascorbic Acid (VITAMIN C) 500 MG CHEW Chew 500 mg by mouth daily.    . B Complex-C (SUPER B COMPLEX PO) Take 1 tablet by mouth daily.    . Calcium Carb-Cholecalciferol (CALCIUM 600+D3 PO) Take 1-2 tablets by mouth See admin instructions. Take 1 tablet in the morning & 2 tablet at night    . calcium elemental as carbonate (BARIATRIC TUMS ULTRA) 400 MG chewable tablet Chew 2,000 mg by mouth at bedtime.    . Cholecalciferol (VITAMIN D3) 2000 units TABS Take 2,000 Units by mouth daily.     . diclofenac Sodium (VOLTAREN) 1 % GEL Apply 1 application topically 4 (four) times daily as needed (pain.).     [name] dorzolamide (TRUSOPT) 2 % ophthalmic solution Place 1 drop into the left eye 2 (two) times daily.     [name] EPINEPHrine 0.3 mg/0.3 mL IJ SOAJ injection Inject 0.3 mg into the muscle as needed (for allergic reaction).    . Flaxseed, Linseed, (FLAXSEED OIL PO) Take 1 tablet by mouth daily.    . fluticasone (FLONASE) 50 MCG/ACT nasal spray Place 1 spray into both nostrils daily.     [name]  irbesartan (AVAPRO) 300 MG tablet Take 300 mg by mouth daily.     [name] LUMIGAN 0.01 % SOLN Place 1 drop into the right eye at bedtime.   4  . Magnesium 250 MG TABS Take 250 mg by mouth daily.    . Multiple Vitamins-Minerals (PRESERVISION AREDS 2 PO) Take 1 tablet by mouth 2 (two) times daily.    . Omega-3 Fatty Acids (FISH OIL) 1000 MG CAPS Take 1,000 mg by mouth daily.    Vladimir Faster Glycol-Propyl Glycol (SYSTANE) 0.4-0.3 % SOLN Place 1 drop into both eyes 3 (three) times daily as needed (for tired/dry eyes.).     [name] prochlorperazine (COMPAZINE) 10 MG tablet Take 1 tablet (10 mg total)  by mouth every 6 (six) hours as needed for nausea or vomiting. 30 tablet 0  . sucralfate (CARAFATE) 1 g tablet Take 1 tablet (1 g total) by mouth 4 (four) times daily -  with meals and at bedtime. Dissolve in 10 mL of warm water prior to swallowing 120 tablet 1  . timolol (TIMOPTIC) 0.5 % ophthalmic solution Place 1 drop into the left eye 2 (two) times daily.    . TURMERIC PO Take 1,500 mg by mouth daily.    . vitamin B-12 (CYANOCOBALAMIN) 1000 MCG tablet Take 1,000 mcg by mouth daily.    . vitamin E 400 UNIT capsule Take 400 Units by mouth daily. (180 mg)    . Zinc 30 MG TABS Take 30 mg by mouth daily.     No current facility-administered medications for this visit.    SURGICAL HISTORY:  Past Surgical History:  Procedure Laterality Date  . APPENDECTOMY    . BACK SURGERY    . CATARACT EXTRACTION    . COLONOSCOPY  07/27/2001, 07/08/2011   Multiple  . ESOPHAGEAL DILATION  2013  . ESOPHAGOGASTRODUODENOSCOPY     multiple  . LAPAROSCOPIC APPENDECTOMY N/A 02/19/2014   Procedure: APPENDECTOMY LAPAROSCOPIC;  Surgeon: Erroll Luna, MD;  Location: [facility];  Service: General;  Laterality: N/A;  . MINOR PLACEMENT OF FIDUCIAL  08/23/2017   Procedure: MINOR PLACEMENT OF FIDUCIAL - Left Upper Lobe Lung x2, Right Upper Lobe Lung x3;  Surgeon: Collene Gobble, MD;  Location: [facility];  Service: Thoracic;;  . TONSILLECTOMY AND ADENOIDECTOMY     and adenoids   . TOTAL KNEE ARTHROPLASTY Left 2007  . VIDEO BRONCHOSCOPY WITH ENDOBRONCHIAL NAVIGATION N/A 08/23/2017   Procedure: VIDEO BRONCHOSCOPY WITH ENDOBRONCHIAL NAVIGATION;  Surgeon: Collene Gobble, MD;  Location: [facility];  Service: Thoracic;  Laterality: N/A;  . VIDEO BRONCHOSCOPY WITH ENDOBRONCHIAL NAVIGATION N/A 01/31/2019   Procedure: VIDEO BRONCHOSCOPY WITH ENDOBRONCHIAL NAVIGATION;  Surgeon: Garner Nash, DO;  Location: [place];  Service: Thoracic;  Laterality: N/A;  . VIDEO BRONCHOSCOPY WITH ENDOBRONCHIAL ULTRASOUND N/A 01/31/2019   Procedure: VIDEO  BRONCHOSCOPY WITH ENDOBRONCHIAL ULTRASOUND;  Surgeon: Garner Nash, DO;  Location: [facility];  Service: Thoracic;  Laterality: N/A;  . WRIST FRACTURE SURGERY Left 2018   with screws    REVIEW OF SYSTEMS:  A comprehensive review of systems was negative except for: Respiratory: positive for dyspnea on exertion Gastrointestinal: positive for dysphagia   PHYSICAL EXAMINATION: General appearance: alert, cooperative and no distress Head: Normocephalic, without obvious abnormality, atraumatic Neck: no adenopathy, no JVD, supple, symmetrical, trachea midline and thyroid not enlarged, symmetric, no tenderness/mass/nodules Lymph nodes: Cervical, supraclavicular, and axillary nodes normal. Resp: clear to auscultation bilaterally Back: symmetric, no curvature. ROM normal. No CVA tenderness. Cardio: regular rate and  rhythm, S1, S2 normal, no murmur, click, rub or gallop GI: soft, non-tender; bowel sounds normal; no masses,  no organomegaly Extremities: extremities normal, atraumatic, no cyanosis or edema  ECOG PERFORMANCE STATUS: 1 - Symptomatic but completely ambulatory  Blood pressure (!) 151/86, pulse 99, temperature 98 F (36.7 C), temperature source Temporal, resp. rate 18, height 5' 5"  (1.651 m), weight 212 lb 11.2 oz (96.5 kg), SpO2 98 %.  LABORATORY DATA: Lab Results  Component Value Date   WBC 2.0 (L) 04/15/2019   HGB 13.7 04/15/2019   HCT 41.2 04/15/2019   MCV 87.8 04/15/2019   PLT 210 04/15/2019      Chemistry      Component Value Date/Time   NA 139 04/08/2019 1339   K 4.0 04/08/2019 1339   CL 106 04/08/2019 1339   CO2 22 04/08/2019 1339   BUN 12 04/08/2019 1339   CREATININE 0.66 04/08/2019 1339      Component Value Date/Time   CALCIUM 8.7 (L) 04/08/2019 1339   ALKPHOS 169 (H) 04/08/2019 1339   AST 40 04/08/2019 1339   ALT 54 (H) 04/08/2019 1339   BILITOT 1.0 04/08/2019 1339       RADIOGRAPHIC STUDIES: DG Chest 1 View  Result Date: 03/29/2019 CLINICAL DATA:   Status post thoracentesis. EXAM: CHEST  1 VIEW COMPARISON:  Chest x-ray dated February 01, 2019. FINDINGS: The heart size and mediastinal contours are within normal limits. Normal pulmonary vascularity. Trace right pleural effusion. No pneumothorax. Unchanged right upper lobe mass/consolidation with fiducial markers in place. Unchanged small nodule in the left upper lobe with fiducial markers in place. No acute osseous abnormality. IMPRESSION: 1. Trace right pleural effusion.  No pneumothorax. 2. Unchanged right upper lobe mass/consolidation and left upper lobe nodule. Electronically Signed   By: Titus Dubin [name]   On: 03/29/2019 11:22   US Thoracentesis Asp Pleural space w/IMG guide  Result Date: 03/29/2019 INDICATION: Patient with history of right lung adenocarcinoma, dyspnea, right pleural effusion. Request made for diagnostic and therapeutic right thoracentesis. EXAM: ULTRASOUND GUIDED DIAGNOSTIC AND THERAPEUTIC RIGHT THORACENTESIS MEDICATIONS: None COMPLICATIONS: None immediate. PROCEDURE: An ultrasound guided thoracentesis was thoroughly discussed with the patient and questions answered. The benefits, risks, alternatives and complications were also discussed. The patient understands and wishes to proceed with the procedure. Written consent was obtained. Ultrasound was performed to localize and mark an adequate pocket of fluid in the right chest. The area was then prepped and draped in the normal sterile fashion. 1% Lidocaine was used for local anesthesia. Under ultrasound guidance a 6 Fr Safe-T-Centesis catheter was introduced. Thoracentesis was performed. The catheter was removed and a dressing applied. FINDINGS: A total of approximately 1.8 liters of yellow fluid was removed. Samples were sent to the laboratory as requested by the clinical team. IMPRESSION: Successful ultrasound guided diagnostic and therapeutic right thoracentesis yielding 1.8 liters of pleural fluid. Read by: Rowe Robert, PA-C  Electronically Signed   By: Jerilynn Mages.  Shick [name]   On: 03/29/2019 12:17    ASSESSMENT AND PLAN: This is a very pleasant 86 years old white female with a stage IIIc/IV non-small cell lung cancer, poorly differentiated adenocarcinoma presented with right upper lobe lung mass in addition to mediastinal lymphadenopathy diagnosed in December 2020. Molecular studies by [facility] showed no actionable mutations. She is currently undergoing a course of concurrent chemoradiation with weekly carboplatin and paclitaxel status post 6 cycles.   I recommended for the patient to proceed with cycle #7 today as planned.  She  is completing her radiotherapy later this week. I will see the patient back for follow-up visit in 3 weeks for evaluation with repeat CT scan of the chest for restaging of her disease. For the dysphagia and odynophagia, she was advised to use Carafate on as-needed basis. For hypertension, she was advised to continue with her current blood pressure medications and to monitor it closely at home. The patient was advised to call immediately if she has any other concerning symptoms in the interval. The patient voices understanding of current disease status and treatment options and is in agreement with the current care plan. All questions were answered. The patient knows to call the clinic with any problems, questions or concerns. We can certainly see the patient much sooner if necessary.   Disclaimer: This note was dictated with voice recognition software. Similar sounding words can inadvertently be transcribed and may not be corrected upon review.

## 2019-04-16 ENCOUNTER — Ambulatory Visit: Payer: PPO

## 2019-04-16 ENCOUNTER — Other Ambulatory Visit: Payer: Self-pay

## 2019-04-16 ENCOUNTER — Ambulatory Visit
Admission: RE | Admit: 2019-04-16 | Discharge: 2019-04-16 | Disposition: A | Discharge status: Home / Self Care | Payer: PPO | Source: Ambulatory Visit | Attending: Radiation Oncology | Admitting: Radiation Oncology

## 2019-04-16 DIAGNOSIS — C3491 Malignant neoplasm of unspecified part of right bronchus or lung: Secondary | ICD-10-CM | POA: Diagnosis not present

## 2019-04-16 DIAGNOSIS — C3411 Malignant neoplasm of upper lobe, right bronchus or lung: Secondary | ICD-10-CM | POA: Diagnosis not present

## 2019-04-17 ENCOUNTER — Telehealth: Payer: Self-pay | Admitting: Internal Medicine

## 2019-04-17 ENCOUNTER — Ambulatory Visit
Admission: RE | Admit: 2019-04-17 | Discharge: 2019-04-17 | Disposition: A | Discharge status: Home / Self Care | Payer: PPO | Source: Ambulatory Visit | Attending: Radiation Oncology | Admitting: Radiation Oncology

## 2019-04-17 ENCOUNTER — Other Ambulatory Visit: Payer: Self-pay

## 2019-04-17 ENCOUNTER — Ambulatory Visit: Payer: PPO

## 2019-04-17 DIAGNOSIS — C3491 Malignant neoplasm of unspecified part of right bronchus or lung: Secondary | ICD-10-CM | POA: Diagnosis not present

## 2019-04-17 DIAGNOSIS — C3411 Malignant neoplasm of upper lobe, right bronchus or lung: Secondary | ICD-10-CM | POA: Diagnosis not present

## 2019-04-17 LAB — MAC SUSCEPTIBILITY BROTH
Amikacin: 8
Clarithromycin: 1
Linezolid: 8
Moxifloxacin: 0.5
Streptomycin: 64

## 2019-04-17 LAB — ORG ID BY SEQUENCING RFLX AST

## 2019-04-17 NOTE — Telephone Encounter (Signed)
Scheduled per los. Called and left msg. Mailed printout  °

## 2019-04-18 ENCOUNTER — Ambulatory Visit: Payer: PPO

## 2019-04-19 ENCOUNTER — Other Ambulatory Visit: Payer: Self-pay

## 2019-04-19 ENCOUNTER — Ambulatory Visit
Admission: RE | Admit: 2019-04-19 | Discharge: 2019-04-19 | Disposition: A | Discharge status: Home / Self Care | Payer: PPO | Source: Ambulatory Visit | Attending: Radiation Oncology | Admitting: Radiation Oncology

## 2019-04-19 ENCOUNTER — Encounter: Payer: Self-pay | Admitting: Radiation Oncology

## 2019-04-19 DIAGNOSIS — C3411 Malignant neoplasm of upper lobe, right bronchus or lung: Secondary | ICD-10-CM | POA: Diagnosis not present

## 2019-04-19 DIAGNOSIS — C3491 Malignant neoplasm of unspecified part of right bronchus or lung: Secondary | ICD-10-CM | POA: Diagnosis not present

## 2019-04-19 LAB — MISC LABCORP TEST (SEND OUT): Labcorp test code: 183802

## 2019-04-29 NOTE — Progress Notes (Incomplete)
  Patient Name: Ashley Parker MRN: [id number] DOB: [birth year] Referring Physician: Prince Solian (Profile Not Attached) Date of Service: 04/19/2019 [facility], [place]                                                        End Of Treatment Note  Diagnoses: C34.11-Malignant neoplasm of upper lobe, right bronchus or lung C34.91-Adenocarcinoma of right lung, stage 3 (H  Cancer Staging: Stage IIIC/IV (T4, N2, M0//M1a)Non-small cell carcinoma, poorly differentiated adenocarcinoma,presented with large right upper lobe lung mass in addition to mediastinal lymphadenopathy and  left upper lobe nodulediagnosed in December 2020.  Intent: Curative  Radiation Treatment Dates: 03/06/2019 through 04/19/2019 Site Technique Total Dose (Gy) Dose per Fx (Gy) Completed Fx Beam Energies  Lung, Right: Lung_Rt IMRT 60/60 2 30/30 6X   Narrative: The patient tolerated radiation therapy relatively well. During the beginning of treatment, she reported a new, positional cough and one episode of vomiting. She denied shortness of breath and chest pain. On 03/12/2019 examination, the patient was noted to have decreased breath sounds noted in the right lower lung field consistent with the pleural effusion noted on treatment planning CT scan.  As treatment continued, the patient reported moderate fatigue, shortness of breath with exertion, and continued non-productive cough. She denied hemoptysis and difficulty swallowing. On 03/19/2019, the patient continued to have decreased breath sounds in the right lower lung field. Findings of right pleural effusion were discussed, but the patient did not wish to consider therapeutic thoracentesis at that time.  On 03/26/2019, the patient began to report difficulty swallowing but related that to getting a large  vitamin "stuck". She had no other trouble swallowing with the exception of that one episode. On that same day, it was noted that the patient was becoming more symptomatic from her right pleural effusion. She agreed with diagnostic and therapeutic thoracentesis. She was also given Carafate for her esophageal symptoms. However, she was unable to tolerate it secondary to nausea and the inability to swallow the pills.  The patient underwent an ultrasound-guided diagnostic and therapeutic right thoracentesis on 03/29/2019 performed by Odelia Gage, PA-C. The procedure was successful and there was only trace right plural effusion seen on the follow-up chest x-ray. 1.8 liters of fluid was removed from the right lung. Cytology report showed the presence of atypical cells.  After the procedure, the patient's breathing had improved. The patient was beginning to report constipation, for which she was advised to use a laxative. Breath sounds in the right lung field were then only mildly decreased as compared to prior.  Of note, the patient was also received radiosensitizing chemotherapy during radiation treatment.   Plan: The patient will follow-up with radiation oncology in one month.  ________________________________________________   Blair Promise, PhD, MD  This document serves as a record of services personally performed by Gery Pray, MD. It was created on his behalf by Clerance Lav, a trained medical scribe. The creation of this record is based on the scribe's personal observations and the provider's statements to them. This document has been checked and approved by the attending provider.

## 2019-05-03 ENCOUNTER — Ambulatory Visit (HOSPITAL_COMMUNITY)
Admission: RE | Admit: 2019-05-03 | Discharge: 2019-05-03 | Disposition: A | Discharge status: Home / Self Care | Payer: PPO | Source: Ambulatory Visit | Attending: Internal Medicine | Admitting: Internal Medicine

## 2019-05-03 ENCOUNTER — Other Ambulatory Visit: Payer: Self-pay

## 2019-05-03 ENCOUNTER — Inpatient Hospital Stay: Payer: PPO | Attending: Internal Medicine

## 2019-05-03 DIAGNOSIS — R5383 Other fatigue: Secondary | ICD-10-CM | POA: Diagnosis not present

## 2019-05-03 DIAGNOSIS — Z8719 Personal history of other diseases of the digestive system: Secondary | ICD-10-CM | POA: Diagnosis not present

## 2019-05-03 DIAGNOSIS — R05 Cough: Secondary | ICD-10-CM | POA: Insufficient documentation

## 2019-05-03 DIAGNOSIS — R1310 Dysphagia, unspecified: Secondary | ICD-10-CM | POA: Diagnosis not present

## 2019-05-03 DIAGNOSIS — C3490 Malignant neoplasm of unspecified part of unspecified bronchus or lung: Secondary | ICD-10-CM | POA: Diagnosis not present

## 2019-05-03 DIAGNOSIS — Z79899 Other long term (current) drug therapy: Secondary | ICD-10-CM | POA: Insufficient documentation

## 2019-05-03 DIAGNOSIS — J910 Malignant pleural effusion: Secondary | ICD-10-CM | POA: Diagnosis not present

## 2019-05-03 DIAGNOSIS — Z5112 Encounter for antineoplastic immunotherapy: Secondary | ICD-10-CM | POA: Diagnosis not present

## 2019-05-03 DIAGNOSIS — C3411 Malignant neoplasm of upper lobe, right bronchus or lung: Secondary | ICD-10-CM | POA: Insufficient documentation

## 2019-05-03 DIAGNOSIS — M1990 Unspecified osteoarthritis, unspecified site: Secondary | ICD-10-CM | POA: Diagnosis not present

## 2019-05-03 DIAGNOSIS — R0609 Other forms of dyspnea: Secondary | ICD-10-CM | POA: Diagnosis not present

## 2019-05-03 DIAGNOSIS — Z85828 Personal history of other malignant neoplasm of skin: Secondary | ICD-10-CM | POA: Diagnosis not present

## 2019-05-03 DIAGNOSIS — R590 Localized enlarged lymph nodes: Secondary | ICD-10-CM | POA: Diagnosis not present

## 2019-05-03 DIAGNOSIS — I10 Essential (primary) hypertension: Secondary | ICD-10-CM | POA: Insufficient documentation

## 2019-05-03 LAB — CBC WITH DIFFERENTIAL (CANCER CENTER ONLY)
Abs Immature Granulocytes: 0.03 10*3/uL (ref 0.00–0.07)
Basophils Absolute: 0 10*3/uL (ref 0.0–0.1)
Basophils Relative: 1 %
Eosinophils Absolute: 0.1 10*3/uL (ref 0.0–0.5)
Eosinophils Relative: 1 %
HCT: 41.4 % (ref 36.0–46.0)
Hemoglobin: 13.5 g/dL (ref 12.0–15.0)
Immature Granulocytes: 1 %
Lymphocytes Relative: 7 %
Lymphs Abs: 0.3 10*3/uL — ABNORMAL LOW (ref 0.7–4.0)
MCH: 29.9 pg (ref 26.0–34.0)
MCHC: 32.6 g/dL (ref 30.0–36.0)
MCV: 91.6 fL (ref 80.0–100.0)
Monocytes Absolute: 0.5 10*3/uL (ref 0.1–1.0)
Monocytes Relative: 13 %
Neutro Abs: 3.4 10*3/uL (ref 1.7–7.7)
Neutrophils Relative %: 77 %
Platelet Count: 234 10*3/uL (ref 150–400)
RBC: 4.52 MIL/uL (ref 3.87–5.11)
RDW: 17.4 % — ABNORMAL HIGH (ref 11.5–15.5)
WBC Count: 4.3 10*3/uL (ref 4.0–10.5)
nRBC: 0 % (ref 0.0–0.2)

## 2019-05-03 LAB — CMP (CANCER CENTER ONLY)
ALT: 46 U/L — ABNORMAL HIGH (ref 0–44)
AST: 38 U/L (ref 15–41)
Albumin: 3.4 g/dL — ABNORMAL LOW (ref 3.5–5.0)
Alkaline Phosphatase: 176 U/L — ABNORMAL HIGH (ref 38–126)
Anion gap: 8 (ref 5–15)
BUN: 13 mg/dL (ref 8–23)
CO2: 24 mmol/L (ref 22–32)
Calcium: 9.3 mg/dL (ref 8.9–10.3)
Chloride: 107 mmol/L (ref 98–111)
Creatinine: 0.77 mg/dL (ref 0.44–1.00)
GFR, Est AFR Am: >60 mL/min (ref >60)
GFR, Estimated: >60 mL/min (ref >60)
Glucose, Bld: 127 mg/dL — ABNORMAL HIGH (ref 70–99)
Potassium: 3.9 mmol/L (ref 3.5–5.1)
Sodium: 139 mmol/L (ref 135–145)
Total Bilirubin: 0.7 mg/dL (ref 0.3–1.2)
Total Protein: 6.9 g/dL (ref 6.5–8.1)

## 2019-05-03 MED ORDER — SODIUM CHLORIDE (PF) 0.9 % IJ SOLN
INTRAMUSCULAR | Status: AC
  Filled 2019-05-03: qty 50

## 2019-05-03 MED ORDER — IOHEXOL 300 MG/ML  SOLN
75.0000 mL | Freq: Once | INTRAMUSCULAR | Status: AC | PRN
  Administered 2019-05-03: 75 mL via INTRAVENOUS

## 2019-05-06 ENCOUNTER — Encounter: Payer: Self-pay | Admitting: Internal Medicine

## 2019-05-06 ENCOUNTER — Inpatient Hospital Stay (HOSPITAL_BASED_OUTPATIENT_CLINIC_OR_DEPARTMENT_OTHER): Payer: PPO | Admitting: Internal Medicine

## 2019-05-06 ENCOUNTER — Other Ambulatory Visit: Payer: Self-pay

## 2019-05-06 VITALS — BP 141/88 | HR 106 | Temp 98.2°F | Resp 18 | Ht 65.0 in | Wt 211.0 lb

## 2019-05-06 DIAGNOSIS — I10 Essential (primary) hypertension: Secondary | ICD-10-CM

## 2019-05-06 DIAGNOSIS — C3491 Malignant neoplasm of unspecified part of right bronchus or lung: Secondary | ICD-10-CM

## 2019-05-06 DIAGNOSIS — J90 Pleural effusion, not elsewhere classified: Secondary | ICD-10-CM | POA: Diagnosis not present

## 2019-05-06 DIAGNOSIS — Z7189 Other specified counseling: Secondary | ICD-10-CM

## 2019-05-06 DIAGNOSIS — Z5112 Encounter for antineoplastic immunotherapy: Secondary | ICD-10-CM | POA: Insufficient documentation

## 2019-05-06 NOTE — Progress Notes (Signed)
[place] Telephone:([phone]) [phone]   [phone]) [phone]  OFFICE PROGRESS NOTE  [place], MD [place] [address]  DIAGNOSIS: stage IIIc/IV (T4, N2, M0//M1a) lNon-small cell carcinoma, poorly differentiated adenocarcinoma,  presented with large right upper lobe lung mass in addition to mediastinal lymphadenopathy and suspicious left upper lobe nodule diagnosed in December 2020.  Molecular studies by [facility]:  [id number] 0.2% Binimetinib  [id number] 4.8% None (VUS) No (VUS)  [id number] 0.2% None (VUS) No (VUS)  PRIOR THERAPY: Concurrent chemoradiation with weekly carboplatin for AUC of 2 and paclitaxel 45 mg/M2.  First dose of March 04, 2019.  Status post 7 cycles.  Last dose was given on April 15, 2019.  CURRENT THERAPY: Consolidation immunotherapy with Imfinzi 1500 mg IV every 4 weeks.  First dose May 13, 2019.  INTERVAL HISTORY: Ashley Parker 86 y.o. female returns to the clinic today for follow-up visit.  The patient is feeling fine today with no concerning complaints except for mild dysphagia.  She is recovering well from the course of concurrent chemoradiation.  She denied having any current chest pain but has shortness of breath with exertion with mild cough and no hemoptysis.  She denied having any fever or chills.  She has no nausea, vomiting, diarrhea or constipation.  She has no recent weight loss or night sweats.  She denied having any headache or visual changes.  The patient had repeat CT scan of the chest performed recently and she is here for evaluation and discussion of her scan results.   MEDICAL HISTORY: Past Medical History:  Diagnosis Date  . Arthritis   . Basal cell carcinoma (BCC) of skin of face 2016   removed in 2016  . COPD (chronic obstructive pulmonary disease) (HCC)    Mild  . Crohn's ileitis ([place])    Mild, mostly asymptomatic, not on therapy  . Fundic gland polyposis of stomach   . GERD  (gastroesophageal reflux disease)   . Glaucoma   . Hemorrhoids    Internal and External  . History of hiatal hernia   . Hypertension   . Macular degeneration   . Obesity, unspecified   . Osteoporosis   . Pelvic fracture ([place]) 2007  . Stricture of esophagus    distal  . Unspecified vitamin D deficiency     ALLERGIES:  is allergic to wasp venom; biaxin [clarithromycin]; and brimonidine.  MEDICATIONS:  Current Outpatient Medications  Medication Sig Dispense Refill  . albuterol (VENTOLIN HFA) 108 (90 Base) MCG/ACT inhaler Inhale 1 puff into the lungs as needed for wheezing or shortness of breath.    . Ascorbic Acid (VITAMIN C) 500 MG CHEW Chew 500 mg by mouth daily.    . B Complex-C (SUPER B COMPLEX PO) Take 1 tablet by mouth daily.    . Calcium Carb-Cholecalciferol (CALCIUM 600+D3 PO) Take 1-2 tablets by mouth See admin instructions. Take 1 tablet in the morning & 2 tablet at night    . calcium elemental as carbonate (BARIATRIC TUMS ULTRA) 400 MG chewable tablet Chew 2,000 mg by mouth at bedtime.    . Cholecalciferol (VITAMIN D3) 2000 units TABS Take 2,000 Units by mouth daily.     . diclofenac Sodium (VOLTAREN) 1 % GEL Apply 1 application topically 4 (four) times daily as needed (pain.).     [name] dorzolamide (TRUSOPT) 2 % ophthalmic solution Place 1 drop into the left eye 2 (two) times daily.     [name] EPINEPHrine 0.3  mg/0.3 mL IJ SOAJ injection Inject 0.3 mg into the muscle as needed (for allergic reaction).    . Flaxseed, Linseed, (FLAXSEED OIL PO) Take 1 tablet by mouth daily.    . fluticasone (FLONASE) 50 MCG/ACT nasal spray Place 1 spray into both nostrils daily.     . irbesartan (AVAPRO) 300 MG tablet Take 300 mg by mouth daily.     [name] LUMIGAN 0.01 % SOLN Place 1 drop into the right eye at bedtime.   4  . Magnesium 250 MG TABS Take 250 mg by mouth daily.    . Multiple Vitamins-Minerals (PRESERVISION AREDS 2 PO) Take 1 tablet by mouth 2 (two) times daily.    . Omega-3 Fatty Acids (FISH  OIL) 1000 MG CAPS Take 1,000 mg by mouth daily.    Vladimir Faster Glycol-Propyl Glycol (SYSTANE) 0.4-0.3 % SOLN Place 1 drop into both eyes 3 (three) times daily as needed (for tired/dry eyes.).     [name] prochlorperazine (COMPAZINE) 10 MG tablet Take 1 tablet (10 mg total) by mouth every 6 (six) hours as needed for nausea or vomiting. 30 tablet 0  . sucralfate (CARAFATE) 1 g tablet Take 1 tablet (1 g total) by mouth 4 (four) times daily -  with meals and at bedtime. Dissolve in 10 mL of warm water prior to swallowing 120 tablet 1  . timolol (TIMOPTIC) 0.5 % ophthalmic solution Place 1 drop into the left eye 2 (two) times daily.    . TURMERIC PO Take 1,500 mg by mouth daily.    . vitamin B-12 (CYANOCOBALAMIN) 1000 MCG tablet Take 1,000 mcg by mouth daily.    . vitamin E 400 UNIT capsule Take 400 Units by mouth daily. (180 mg)    . Zinc 30 MG TABS Take 30 mg by mouth daily.     No current facility-administered medications for this visit.    SURGICAL HISTORY:  Past Surgical History:  Procedure Laterality Date  . APPENDECTOMY    . BACK SURGERY    . CATARACT EXTRACTION    . COLONOSCOPY  07/27/2001, 07/08/2011   Multiple  . ESOPHAGEAL DILATION  2013  . ESOPHAGOGASTRODUODENOSCOPY     multiple  . LAPAROSCOPIC APPENDECTOMY N/A 02/19/2014   Procedure: APPENDECTOMY LAPAROSCOPIC;  Surgeon: Erroll Luna, MD;  Location: [facility];  Service: General;  Laterality: N/A;  . MINOR PLACEMENT OF FIDUCIAL  08/23/2017   Procedure: MINOR PLACEMENT OF FIDUCIAL - Left Upper Lobe Lung x2, Right Upper Lobe Lung x3;  Surgeon: Collene Gobble, MD;  Location: [facility];  Service: Thoracic;;  . TONSILLECTOMY AND ADENOIDECTOMY     and adenoids   . TOTAL KNEE ARTHROPLASTY Left 2007  . VIDEO BRONCHOSCOPY WITH ENDOBRONCHIAL NAVIGATION N/A 08/23/2017   Procedure: VIDEO BRONCHOSCOPY WITH ENDOBRONCHIAL NAVIGATION;  Surgeon: Collene Gobble, MD;  Location: [facility];  Service: Thoracic;  Laterality: N/A;  . VIDEO BRONCHOSCOPY WITH  ENDOBRONCHIAL NAVIGATION N/A 01/31/2019   Procedure: VIDEO BRONCHOSCOPY WITH ENDOBRONCHIAL NAVIGATION;  Surgeon: Garner Nash, DO;  Location: [facility];  Service: Thoracic;  Laterality: N/A;  . VIDEO BRONCHOSCOPY WITH ENDOBRONCHIAL ULTRASOUND N/A 01/31/2019   Procedure: VIDEO BRONCHOSCOPY WITH ENDOBRONCHIAL ULTRASOUND;  Surgeon: Garner Nash, DO;  Location: [facility];  Service: Thoracic;  Laterality: N/A;  . WRIST FRACTURE SURGERY Left 2018   with screws    REVIEW OF SYSTEMS:  Constitutional: positive for fatigue Eyes: negative Ears, nose, mouth, throat, and face: negative Respiratory: positive for cough and dyspnea on exertion Cardiovascular: negative Gastrointestinal: positive  for dysphagia Genitourinary:negative Integument/breast: negative Hematologic/lymphatic: negative Musculoskeletal:negative Neurological: negative Behavioral/Psych: negative Endocrine: negative Allergic/Immunologic: negative   PHYSICAL EXAMINATION: General appearance: alert, cooperative, fatigued and no distress Head: Normocephalic, without obvious abnormality, atraumatic Neck: no adenopathy, no JVD, supple, symmetrical, trachea midline and thyroid not enlarged, symmetric, no tenderness/mass/nodules Lymph nodes: Cervical, supraclavicular, and axillary nodes normal. Resp: clear to auscultation bilaterally Back: symmetric, no curvature. ROM normal. No CVA tenderness. Cardio: regular rate and rhythm, S1, S2 normal, no murmur, click, rub or gallop GI: soft, non-tender; bowel sounds normal; no masses,  no organomegaly Extremities: extremities normal, atraumatic, no cyanosis or edema Neurologic: Alert and oriented X 3, normal strength and tone. Normal symmetric reflexes. Normal coordination and gait  ECOG PERFORMANCE STATUS: 1 - Symptomatic but completely ambulatory  Blood pressure (!) 141/88, pulse (!) 106, temperature 98.2 F (36.8 C), temperature source Temporal, resp. rate 18, height 5' 5"  (1.651 m), weight 211  lb (95.7 kg), SpO2 96 %.  LABORATORY DATA: Lab Results  Component Value Date   WBC 4.3 05/03/2019   HGB 13.5 05/03/2019   HCT 41.4 05/03/2019   MCV 91.6 05/03/2019   PLT 234 05/03/2019      Chemistry      Component Value Date/Time   NA 139 05/03/2019 1128   K 3.9 05/03/2019 1128   CL 107 05/03/2019 1128   CO2 24 05/03/2019 1128   BUN 13 05/03/2019 1128   CREATININE 0.77 05/03/2019 1128      Component Value Date/Time   CALCIUM 9.3 05/03/2019 1128   ALKPHOS 176 (H) 05/03/2019 1128   AST 38 05/03/2019 1128   ALT 46 (H) 05/03/2019 1128   BILITOT 0.7 05/03/2019 1128       RADIOGRAPHIC STUDIES: CT Chest W Contrast  Result Date: 05/03/2019 CLINICAL DATA:  Non-small cell lung cancer staging EXAM: CT CHEST WITH CONTRAST TECHNIQUE: Multidetector CT imaging of the chest was performed during intravenous contrast administration. CONTRAST:  35m OMNIPAQUE IOHEXOL 300 MG/ML  SOLN COMPARISON:  CT chest 12/05/2018 FINDINGS: Cardiovascular: Calcified and noncalcified atherosclerotic plaque throughout the thoracic aorta. Signs of coronary artery calcification as well. Heart size is normal without pericardial effusion or thickening. Central pulmonary vasculature is normal. Mediastinum/Nodes: Bulky largely necrotic right paratracheal lymph nodes with other lymph nodes tracking towards right hilum and in the subcarinal region. Right paratracheal lymph node 19 mm (image 36, series 2) previously approximately 11 mm. (Image 44, series 2) 17 mm right paratracheal lymph node previously approximately 19 mm. (Image 61, series 2) 10 mm subcarinal lymph node previously approximately 16 mm. Right hilar nodal tissue is difficult to assess. See below for details regarding right upper lobe process. Lungs/Pleura: Moderate right-sided pleural effusion. This is new compared to the study of 12/05/2018 but has been noted on intervening chest x-rays. Masslike consolidative change in the right chest measures approximately 8  by 5 cm as compared to 9.5 x 6.5 cm. Left upper lobe nodule enlarging since the prior study (image 28, series 5 17 x 13 mm. Airways aside from right upper lobe airway abnormalities are similar to the previous exam. No additional new nodules. Nodule in the extrapleural fat of the dependent right chest (image 127, series 2) 11 mm. Other areas of nodularity in the extrapleural fat or better seen on the coronal data set, for instance on image 51 of series 6 there is an approximately 1 cm area of pleural nodularity along the anterior chest with smaller areas adjacent to this and subtle enhancing nodularity extending into  the intercostal musculature along the right sixth intercostal space (image 35, series 6) Upper Abdomen: Lobular hepatic contours with density suggesting background steatosis. No acute findings in the upper abdomen. Left adrenal thickening is unchanged. Musculoskeletal: Osteopenia.  Spinal degenerative change. IMPRESSION: Right upper lobe process with volume loss and masslike consolidative change similar to the prior exam accounting for differences in technique. Enlargement of some in decrease in size of other mediastinal lymph nodes some of this may represent mixed response or could be treatment related though the enlargement of the left upper lobe process and development of nodularity in the extrapleural fat of the right chest raise the question of worsening of disease. Moderate right-sided pleural effusion presumably malignant given other findings described above. Aortic Atherosclerosis (ICD10-I70.0). Electronically Signed   By: Zetta Bills [name]   On: 05/03/2019 14:58    ASSESSMENT AND PLAN: This is a very pleasant 86 years old white female with a stage IIIc/IV non-small cell lung cancer, poorly differentiated adenocarcinoma presented with right upper lobe lung mass in addition to mediastinal lymphadenopathy diagnosed in December 2020. Molecular studies by guardant 360 showed no actionable  mutations. She underwent a course of concurrent chemoradiation with weekly carboplatin and paclitaxel status post 7 cycles.  She tolerated her treatment well except for mild odynophagia and dysphagia.  Her condition improved but she has persistent mild dysphagia. The patient had repeat CT scan of the chest performed recently.  I personally and independently reviewed the scan images and discussed the results with the patient today. Her scan showed persistent consolidation in the right upper lobe with mixed change in the size of the mediastinal lymph nodes.  The patient also has recurrent right pleural effusion. I had a lengthy discussion with the patient today about her current condition and treatment options. In the absence of clear disease progression, I recommended for the patient treatment with consolidation immunotherapy with Imfinzi 1500 mg IV every 4 weeks for a total of 1 year. The patient was also given the option of observation and close monitoring.  She is interested in treatment and she is expected to start the first cycle of this treatment next week. I discussed with the patient the adverse effects of this treatment including but not limited to immunotherapy mediated skin rash, diarrhea, inflammation of the lung, kidney, liver, thyroid or other endocrine dysfunction. For the recurrent right pleural effusion, I will arrange for the patient to have ultrasound-guided right thoracentesis and will send the fluid for cytology evaluation. The patient will come back for follow-up visit in 5 weeks with the start of cycle #2. She was advised to call immediately if she has any concerning symptoms in the interval. The patient voices understanding of current disease status and treatment options and is in agreement with the current care plan. All questions were answered. The patient knows to call the clinic with any problems, questions or concerns. We can certainly see the patient much sooner if  necessary.     Disclaimer: This note was dictated with voice recognition software. Similar sounding words can inadvertently be transcribed and may not be corrected upon review.

## 2019-05-07 NOTE — Progress Notes (Signed)
DISCONTINUE ON PATHWAY REGIMEN - Non-Small Cell Lung     Administer weekly:     Paclitaxel      Carboplatin   **Always confirm dose/schedule in your pharmacy ordering system**  REASON: Continuation Of Treatment PRIOR TREATMENT: [id number]: Carboplatin AUC=2 + Paclitaxel 45 mg/m2 Weekly During Radiation TREATMENT RESPONSE: N/A - Adjuvant Therapy  START ON PATHWAY REGIMEN - Non-Small Cell Lung     A cycle is every 14 days:     Durvalumab   **Always confirm dose/schedule in your pharmacy ordering system**  Patient Characteristics: Stage III - Unresectable, PS = 0, 1 AJCC T Category: T4 Current Disease Status: No Distant Mets or Local Recurrence AJCC N Category: N2 AJCC M Category: M0 AJCC 8 Stage Grouping: IIIB ECOG Performance Status: 1 Intent of Therapy: Curative Intent, Discussed with Patient

## 2019-05-08 ENCOUNTER — Telehealth: Payer: Self-pay | Admitting: Internal Medicine

## 2019-05-08 NOTE — Telephone Encounter (Signed)
Scheduled per los. Called and spoke with patient. Confirmed appt 

## 2019-05-10 ENCOUNTER — Other Ambulatory Visit (HOSPITAL_COMMUNITY)
Admission: RE | Admit: 2019-05-10 | Discharge: 2019-05-10 | Disposition: A | Discharge status: Home / Self Care | Payer: PPO | Source: Ambulatory Visit | Attending: Internal Medicine | Admitting: Internal Medicine

## 2019-05-10 DIAGNOSIS — Z20822 Contact with and (suspected) exposure to covid-19: Secondary | ICD-10-CM | POA: Diagnosis not present

## 2019-05-10 DIAGNOSIS — Z01812 Encounter for preprocedural laboratory examination: Secondary | ICD-10-CM | POA: Diagnosis not present

## 2019-05-10 LAB — SARS CORONAVIRUS 2 (TAT 6-24 HRS): SARS Coronavirus 2: NEGATIVE

## 2019-05-13 ENCOUNTER — Ambulatory Visit (HOSPITAL_COMMUNITY)
Admission: RE | Admit: 2019-05-13 | Discharge: 2019-05-13 | Disposition: A | Discharge status: Home / Self Care | Payer: PPO | Source: Ambulatory Visit | Attending: Internal Medicine | Admitting: Internal Medicine

## 2019-05-13 ENCOUNTER — Other Ambulatory Visit: Payer: Self-pay

## 2019-05-13 ENCOUNTER — Ambulatory Visit (HOSPITAL_COMMUNITY)
Admission: RE | Admit: 2019-05-13 | Discharge: 2019-05-13 | Disposition: A | Discharge status: Home / Self Care | Payer: PPO | Source: Ambulatory Visit | Attending: Radiology | Admitting: Radiology

## 2019-05-13 DIAGNOSIS — C3491 Malignant neoplasm of unspecified part of right bronchus or lung: Secondary | ICD-10-CM | POA: Diagnosis not present

## 2019-05-13 DIAGNOSIS — J90 Pleural effusion, not elsewhere classified: Secondary | ICD-10-CM | POA: Insufficient documentation

## 2019-05-13 DIAGNOSIS — R846 Abnormal cytological findings in specimens from respiratory organs and thorax: Secondary | ICD-10-CM | POA: Diagnosis not present

## 2019-05-13 DIAGNOSIS — Z9889 Other specified postprocedural states: Secondary | ICD-10-CM

## 2019-05-13 MED ORDER — LIDOCAINE HCL 1 % IJ SOLN
INTRAMUSCULAR | Status: AC
  Filled 2019-05-13: qty 20

## 2019-05-13 NOTE — Procedures (Signed)
Ultrasound-guided diagnostic and therapeutic right sided thoracentesis performed yielding 100 mililiters of straw colored fluid. No immediate complications.   Diagnostic fluid was sent to the lab for further analysis. Follow-up chest x-ray pending. EBL is none.

## 2019-05-14 ENCOUNTER — Inpatient Hospital Stay: Payer: PPO

## 2019-05-14 ENCOUNTER — Other Ambulatory Visit: Payer: Self-pay

## 2019-05-14 ENCOUNTER — Ambulatory Visit: Payer: PPO

## 2019-05-14 ENCOUNTER — Other Ambulatory Visit: Payer: PPO

## 2019-05-14 VITALS — BP 147/73 | HR 89 | Temp 98.2°F | Resp 18

## 2019-05-14 DIAGNOSIS — C3491 Malignant neoplasm of unspecified part of right bronchus or lung: Secondary | ICD-10-CM

## 2019-05-14 DIAGNOSIS — Z5112 Encounter for antineoplastic immunotherapy: Secondary | ICD-10-CM | POA: Diagnosis not present

## 2019-05-14 LAB — CBC WITH DIFFERENTIAL (CANCER CENTER ONLY)
Abs Immature Granulocytes: 0.03 10*3/uL (ref 0.00–0.07)
Basophils Absolute: 0 10*3/uL (ref 0.0–0.1)
Basophils Relative: 1 %
Eosinophils Absolute: 0.6 10*3/uL — ABNORMAL HIGH (ref 0.0–0.5)
Eosinophils Relative: 9 %
HCT: 43 % (ref 36.0–46.0)
Hemoglobin: 14.1 g/dL (ref 12.0–15.0)
Immature Granulocytes: 1 %
Lymphocytes Relative: 7 %
Lymphs Abs: 0.4 10*3/uL — ABNORMAL LOW (ref 0.7–4.0)
MCH: 30.2 pg (ref 26.0–34.0)
MCHC: 32.8 g/dL (ref 30.0–36.0)
MCV: 92.1 fL (ref 80.0–100.0)
Monocytes Absolute: 0.6 10*3/uL (ref 0.1–1.0)
Monocytes Relative: 9 %
Neutro Abs: 5 10*3/uL (ref 1.7–7.7)
Neutrophils Relative %: 73 %
Platelet Count: 215 10*3/uL (ref 150–400)
RBC: 4.67 MIL/uL (ref 3.87–5.11)
RDW: 17.4 % — ABNORMAL HIGH (ref 11.5–15.5)
WBC Count: 6.6 10*3/uL (ref 4.0–10.5)
nRBC: 0 % (ref 0.0–0.2)

## 2019-05-14 LAB — CMP (CANCER CENTER ONLY)
ALT: 43 U/L (ref 0–44)
AST: 41 U/L (ref 15–41)
Albumin: 3.5 g/dL (ref 3.5–5.0)
Alkaline Phosphatase: 170 U/L — ABNORMAL HIGH (ref 38–126)
Anion gap: 10 (ref 5–15)
BUN: 14 mg/dL (ref 8–23)
CO2: 21 mmol/L — ABNORMAL LOW (ref 22–32)
Calcium: 9 mg/dL (ref 8.9–10.3)
Chloride: 109 mmol/L (ref 98–111)
Creatinine: 0.71 mg/dL (ref 0.44–1.00)
GFR, Est AFR Am: >60 mL/min (ref >60)
GFR, Estimated: >60 mL/min (ref >60)
Glucose, Bld: 106 mg/dL — ABNORMAL HIGH (ref 70–99)
Potassium: 3.8 mmol/L (ref 3.5–5.1)
Sodium: 140 mmol/L (ref 135–145)
Total Bilirubin: 1 mg/dL (ref 0.3–1.2)
Total Protein: 7 g/dL (ref 6.5–8.1)

## 2019-05-14 LAB — TSH: TSH: 2.107 u[IU]/mL (ref 0.308–3.960)

## 2019-05-14 LAB — CYTOLOGY - NON PAP

## 2019-05-14 MED ORDER — SODIUM CHLORIDE 0.9 % IV SOLN
1500.0000 mg | Freq: Once | INTRAVENOUS | Status: AC
  Administered 2019-05-14: 1500 mg via INTRAVENOUS
  Filled 2019-05-14: qty 30

## 2019-05-14 MED ORDER — SODIUM CHLORIDE 0.9 % IV SOLN
Freq: Once | INTRAVENOUS | Status: AC
  Filled 2019-05-14: qty 250

## 2019-05-14 NOTE — Patient Instructions (Signed)
[facility] Discharge Instructions for Patients Receiving Chemotherapy  Today you received the followingagents    To help prevent nausea and vomiting after your treatment, we encourage you to take your nausea medication as directed    If you develop nausea and vomiting that is not controlled by your nausea medication, call the clinic.   BELOW ARE SYMPTOMS THAT SHOULD BE REPORTED IMMEDIATELY:  *FEVER GREATER THAN 100.5 F  *CHILLS WITH OR WITHOUT FEVER  NAUSEA AND VOMITING THAT IS NOT CONTROLLED WITH YOUR NAUSEA MEDICATION  *UNUSUAL SHORTNESS OF BREATH  *UNUSUAL BRUISING OR BLEEDING  TENDERNESS IN MOUTH AND THROAT WITH OR WITHOUT PRESENCE OF ULCERS  *URINARY PROBLEMS  *BOWEL PROBLEMS  UNUSUAL RASH Items with * indicate a potential emergency and should be followed up as soon as possible.  Feel free to call the clinic you have any questions or concerns. The clinic phone number is [phone].  Please show the [facility] at check-in to the Emergency Department and triage nurse.   Durvalumab injection What is this medicine? DURVALUMAB (dur VAL ue mab) is a monoclonal antibody. It is used to treat urothelial cancer and lung cancer. This medicine may be used for other purposes; ask your health care provider or pharmacist if you have questions. COMMON BRAND NAME(S): IMFINZI What should I tell my health care provider before I take this medicine? They need to know if you have any of these conditions:  diabetes  immune system problems  infection  inflammatory bowel disease  kidney disease  liver disease  lung or breathing disease  lupus  organ transplant  stomach or intestine problems  thyroid disease  an unusual or allergic reaction to durvalumab, other medicines, foods, dyes, or preservatives  pregnant or trying to get pregnant  breast-feeding How should I use this medicine? This medicine is for infusion into a vein. It is given by a  health care professional in a hospital or clinic setting. A special MedGuide will be given to you before each treatment. Be sure to read this information carefully each time. Talk to your pediatrician regarding the use of this medicine in children. Special care may be needed. Overdosage: If you think you have taken too much of this medicine contact a poison control center or emergency room at once. NOTE: This medicine is only for you. Do not share this medicine with others. What if I miss a dose? It is important not to miss your dose. Call your doctor or health care professional if you are unable to keep an appointment. What may interact with this medicine? Interactions have not been studied. This list may not describe all possible interactions. Give your health care provider a list of all the medicines, herbs, non-prescription drugs, or dietary supplements you use. Also tell them if you smoke, drink alcohol, or use illegal drugs. Some items may interact with your medicine. What should I watch for while using this medicine? This drug may make you feel generally unwell. Continue your course of treatment even though you feel ill unless your doctor tells you to stop. You may need blood work done while you are taking this medicine. Do not become pregnant while taking this medicine or for 3 months after stopping it. Women should inform their doctor if they wish to become pregnant or think they might be pregnant. There is a potential for serious side effects to an unborn child. Talk to your health care professional or pharmacist for more information. Do not breast-feed  an infant while taking this medicine or for 3 months after stopping it. What side effects may I notice from receiving this medicine? Side effects that you should report to your doctor or health care professional as soon as possible:  allergic reactions like skin rash, itching or hives, swelling of the face, lips, or tongue  black, tarry  stools  bloody or watery diarrhea  breathing problems  change in emotions or moods  change in sex drive  changes in vision  chest pain or chest tightness  chills  confusion  cough  facial flushing  fever  headache  signs and symptoms of high blood sugar such as dizziness; dry mouth; dry skin; fruity breath; nausea; stomach pain; increased hunger or thirst; increased urination  signs and symptoms of liver injury like dark yellow or brown urine; general ill feeling or flu-like symptoms; light-colored stools; loss of appetite; nausea; right upper belly pain; unusually weak or tired; yellowing of the eyes or skin  stomach pain  trouble passing urine or change in the amount of urine  weight gain or weight loss Side effects that usually do not require medical attention (report these to your doctor or health care professional if they continue or are bothersome):  bone pain  constipation  loss of appetite  muscle pain  nausea  swelling of the ankles, feet, hands  tiredness This list may not describe all possible side effects. Call your doctor for medical advice about side effects. You may report side effects to FDA at 1-800-FDA-1088. Where should I keep my medicine? This drug is given in a hospital or clinic and will not be stored at home. NOTE: This sheet is a summary. It may not cover all possible information. If you have questions about this medicine, talk to your doctor, pharmacist, or health care provider.  2020 Elsevier/Gold Standard (2016-04-26 19:25:04)

## 2019-05-15 ENCOUNTER — Telehealth: Payer: Self-pay | Admitting: *Deleted

## 2019-05-15 NOTE — Progress Notes (Signed)
Radiation Oncology         [phone] ________________________________  Name: Ashley Parker MRN: [id number]  Date: 05/16/2019  DOB: November 13, 1931  Follow-Up Visit Note  CC: Prince Solian, MD  Prince Solian, MD    ICD-10-CM   1. Adenocarcinoma of right lung, stage 3 (HCC)  C34.91     Diagnosis:   Stage IIIC/IV (T4, N2, M0//M1a)Non-small cell carcinoma, poorly differentiated adenocarcinoma,presented with large right upper lobe lung mass in addition to mediastinal lymphadenopathy and left upper lobe nodulediagnosed in December 2020.  Interval Since Last Radiation:  1 month  03/06/2019 through 04/19/2019 Site Technique Total Dose (Gy) Dose per Fx (Gy) Completed Fx Beam Energies  Lung, Right: Lung_Rt IMRT 60/60 2 30/30 6X    Narrative:  The patient returns today for routine follow-up. She last saw Dr. Julien Nordmann for follow up on 05/06/2019. She started on consolidation immunotherapy with Imfinzi on 05/13/2019.  On review of systems, she Pt reports fatigue is lingering. Pt denies c/o pain. Pt is not taking any medications by mouth due to difficulty swallowing. Pt reports occasional cough, usually non-productive. Pt denies hemoptysis. Pt reports occasional SOB with deep breaths or exertion.  She has difficulty swallowing pills she is able to consume liquids and solid foods.  She is only missing  her hypertensive medication and her blood pressure has been low with her weight loss not really needed at this time.  ALLERGIES:  is allergic to wasp venom; biaxin [clarithromycin]; and brimonidine.  Meds: Current Outpatient Medications  Medication Sig Dispense Refill  . albuterol (VENTOLIN HFA) 108 (90 Base) MCG/ACT inhaler Inhale 1 puff into the lungs as needed for wheezing or shortness of breath.    . diclofenac Sodium (VOLTAREN) 1 % GEL Apply 1 application topically 4 (four) times daily as needed (pain.).     [name] dorzolamide (TRUSOPT) 2 % ophthalmic solution Place 1 drop into the left eye  2 (two) times daily.     [name] EPINEPHrine 0.3 mg/0.3 mL IJ SOAJ injection Inject 0.3 mg into the muscle as needed (for allergic reaction).    . fluticasone (FLONASE) 50 MCG/ACT nasal spray Place 1 spray into both nostrils daily.     [name] LUMIGAN 0.01 % SOLN Place 1 drop into the right eye at bedtime.   4  . Polyethyl Glycol-Propyl Glycol (SYSTANE) 0.4-0.3 % SOLN Place 1 drop into both eyes 3 (three) times daily as needed (for tired/dry eyes.).     [name] timolol (TIMOPTIC) 0.5 % ophthalmic solution Place 1 drop into the left eye 2 (two) times daily.    . Ascorbic Acid (VITAMIN C) 500 MG CHEW Chew 500 mg by mouth daily.    . B Complex-C (SUPER B COMPLEX PO) Take 1 tablet by mouth daily.    . Calcium Carb-Cholecalciferol (CALCIUM 600+D3 PO) Take 1-2 tablets by mouth See admin instructions. Take 1 tablet in the morning & 2 tablet at night    . calcium elemental as carbonate (BARIATRIC TUMS ULTRA) 400 MG chewable tablet Chew 2,000 mg by mouth at bedtime.    . Cholecalciferol (VITAMIN D3) 2000 units TABS Take 2,000 Units by mouth daily.     . Flaxseed, Linseed, (FLAXSEED OIL PO) Take 1 tablet by mouth daily.    . irbesartan (AVAPRO) 300 MG tablet Take 300 mg by mouth daily.     . Magnesium 250 MG TABS Take 250 mg by mouth daily.    . Multiple Vitamins-Minerals (PRESERVISION AREDS 2 PO) Take 1 tablet by mouth  2 (two) times daily.    . Omega-3 Fatty Acids (FISH OIL) 1000 MG CAPS Take 1,000 mg by mouth daily.    . prochlorperazine (COMPAZINE) 10 MG tablet Take 1 tablet (10 mg total) by mouth every 6 (six) hours as needed for nausea or vomiting. (Patient not taking: Reported on 05/16/2019) 30 tablet 0  . sucralfate (CARAFATE) 1 g tablet Take 1 tablet (1 g total) by mouth 4 (four) times daily -  with meals and at bedtime. Dissolve in 10 mL of warm water prior to swallowing (Patient not taking: Reported on 05/06/2019) 120 tablet 1  . TURMERIC PO Take 1,500 mg by mouth daily.    . vitamin B-12 (CYANOCOBALAMIN) 1000 MCG  tablet Take 1,000 mcg by mouth daily.    . vitamin E 400 UNIT capsule Take 400 Units by mouth daily. (180 mg)    . Zinc 30 MG TABS Take 30 mg by mouth daily.     No current facility-administered medications for this encounter.    Physical Findings: The patient is in no acute distress. Patient is alert and oriented.  height is 5' 5"  (1.651 m). Her temporal temperature is 97.8 F (36.6 C). Her blood pressure is 135/57 (abnormal) and her pulse is 97. Her respiration is 18 and oxygen saturation is 97%. .  No significant changes. Lungs are clear to auscultation bilaterally. Heart has regular rate and rhythm. No palpable cervical, supraclavicular, or axillary adenopathy. Abdomen soft, non-tender, normal bowel sounds.   Lab Findings: Lab Results  Component Value Date   WBC 6.6 05/14/2019   HGB 14.1 05/14/2019   HCT 43.0 05/14/2019   MCV 92.1 05/14/2019   PLT 215 05/14/2019    Radiographic Findings: DG Chest 1 View  Result Date: 05/13/2019 CLINICAL DATA:  Thoracentesis EXAM: CHEST  1 VIEW COMPARISON:  03/29/2019 FINDINGS: The heart size and mediastinal contours are stable. Right upper lobe mass/consolidation with fiducial markers and small left upper lobe area of nodularity with fiducial markers appear grossly unchanged compared to prior. Small right pleural effusion with hazy right basilar opacity. No left-sided pleural effusion. No pneumothorax. IMPRESSION: 1. No pneumothorax status post thoracentesis. 2. Small right pleural effusion with hazy right basilar opacity which may reflect atelectasis. 3. Stable right upper lobe mass/consolidation and left upper lobe nodularity with fiducial markers. Electronically Signed   By: Davina Poke D.O.   On: 05/13/2019 12:01   CT Chest W Contrast  Result Date: 05/03/2019 CLINICAL DATA:  Non-small cell lung cancer staging EXAM: CT CHEST WITH CONTRAST TECHNIQUE: Multidetector CT imaging of the chest was performed during intravenous contrast  administration. CONTRAST:  36m OMNIPAQUE IOHEXOL 300 MG/ML  SOLN COMPARISON:  CT chest 12/05/2018 FINDINGS: Cardiovascular: Calcified and noncalcified atherosclerotic plaque throughout the thoracic aorta. Signs of coronary artery calcification as well. Heart size is normal without pericardial effusion or thickening. Central pulmonary vasculature is normal. Mediastinum/Nodes: Bulky largely necrotic right paratracheal lymph nodes with other lymph nodes tracking towards right hilum and in the subcarinal region. Right paratracheal lymph node 19 mm (image 36, series 2) previously approximately 11 mm. (Image 44, series 2) 17 mm right paratracheal lymph node previously approximately 19 mm. (Image 61, series 2) 10 mm subcarinal lymph node previously approximately 16 mm. Right hilar nodal tissue is difficult to assess. See below for details regarding right upper lobe process. Lungs/Pleura: Moderate right-sided pleural effusion. This is new compared to the study of 12/05/2018 but has been noted on intervening chest x-rays. Masslike consolidative change in the  right chest measures approximately 8 by 5 cm as compared to 9.5 x 6.5 cm. Left upper lobe nodule enlarging since the prior study (image 28, series 5 17 x 13 mm. Airways aside from right upper lobe airway abnormalities are similar to the previous exam. No additional new nodules. Nodule in the extrapleural fat of the dependent right chest (image 127, series 2) 11 mm. Other areas of nodularity in the extrapleural fat or better seen on the coronal data set, for instance on image 51 of series 6 there is an approximately 1 cm area of pleural nodularity along the anterior chest with smaller areas adjacent to this and subtle enhancing nodularity extending into the intercostal musculature along the right sixth intercostal space (image 35, series 6) Upper Abdomen: Lobular hepatic contours with density suggesting background steatosis. No acute findings in the upper abdomen. Left  adrenal thickening is unchanged. Musculoskeletal: Osteopenia.  Spinal degenerative change. IMPRESSION: Right upper lobe process with volume loss and masslike consolidative change similar to the prior exam accounting for differences in technique. Enlargement of some in decrease in size of other mediastinal lymph nodes some of this may represent mixed response or could be treatment related though the enlargement of the left upper lobe process and development of nodularity in the extrapleural fat of the right chest raise the question of worsening of disease. Moderate right-sided pleural effusion presumably malignant given other findings described above. Aortic Atherosclerosis (ICD10-I70.0). Electronically Signed   By: Zetta Bills [name]   On: 05/03/2019 14:58   US Thoracentesis Asp Pleural space w/IMG guide  Result Date: 05/13/2019 INDICATION: Patient with history right sided lung adenocarcinoma presents for therapeutic and diagnostic right thoracentesis EXAM: ULTRASOUND GUIDED THERAPEUTIC AND DIAGNOSTIC THORACENTESIS MEDICATIONS: Lidocaine 1% 10 mL COMPLICATIONS: None immediate. PROCEDURE: An ultrasound guided thoracentesis was thoroughly discussed with the patient and questions answered. The benefits, risks, alternatives and complications were also discussed. The patient understands and wishes to proceed with the procedure. Written consent was obtained. Ultrasound was performed to localize and mark an adequate pocket of fluid in the right chest. The area was then prepped and draped in the normal sterile fashion. 1% Lidocaine was used for local anesthesia. Under ultrasound guidance a 6 Fr Safe-T-Centesis catheter was introduced. Thoracentesis was performed. The catheter was removed and a dressing applied. FINDINGS: A total of approximately 100 mL of straw-colored fluid was removed. Samples were sent to the laboratory as requested by the clinical team. IMPRESSION: Successful ultrasound guided therapeutic and  diagnostic right-sided thoracentesis yielding 100 mL of pleural fluid. Read by Rushie Nyhan NP Electronically Signed   By: Jacqulynn Cadet [name]   On: 05/13/2019 12:31    Impression:  The patient is recovering from the effects of radiation.  Clinically stable at this time.  Plan: As needed follow-up in radiation oncology.  The patient will continue immunotherapy as above.  ____________________________________   Blair Promise, PhD, MD   This document serves as a record of services personally performed by Gery Pray, MD. It was created on his behalf by Wilburn Mylar, a trained medical scribe. The creation of this record is based on the scribe's personal observations and the provider's statements to them. This document has been checked and approved by the attending provider.

## 2019-05-16 ENCOUNTER — Encounter: Payer: Self-pay | Admitting: Radiation Oncology

## 2019-05-16 ENCOUNTER — Other Ambulatory Visit: Payer: Self-pay

## 2019-05-16 ENCOUNTER — Ambulatory Visit
Admission: RE | Admit: 2019-05-16 | Discharge: 2019-05-16 | Disposition: A | Discharge status: Home / Self Care | Payer: PPO | Source: Ambulatory Visit | Attending: Radiation Oncology | Admitting: Radiation Oncology

## 2019-05-16 VITALS — BP 135/57 | HR 97 | Temp 97.8°F | Resp 18 | Ht 65.0 in

## 2019-05-16 DIAGNOSIS — R5383 Other fatigue: Secondary | ICD-10-CM | POA: Diagnosis not present

## 2019-05-16 DIAGNOSIS — R599 Enlarged lymph nodes, unspecified: Secondary | ICD-10-CM | POA: Diagnosis not present

## 2019-05-16 DIAGNOSIS — J910 Malignant pleural effusion: Secondary | ICD-10-CM | POA: Insufficient documentation

## 2019-05-16 DIAGNOSIS — C7802 Secondary malignant neoplasm of left lung: Secondary | ICD-10-CM | POA: Diagnosis not present

## 2019-05-16 DIAGNOSIS — C3411 Malignant neoplasm of upper lobe, right bronchus or lung: Secondary | ICD-10-CM | POA: Diagnosis not present

## 2019-05-16 DIAGNOSIS — Z79899 Other long term (current) drug therapy: Secondary | ICD-10-CM | POA: Diagnosis not present

## 2019-05-16 DIAGNOSIS — Z923 Personal history of irradiation: Secondary | ICD-10-CM | POA: Diagnosis not present

## 2019-05-16 DIAGNOSIS — M8580 Other specified disorders of bone density and structure, unspecified site: Secondary | ICD-10-CM | POA: Insufficient documentation

## 2019-05-16 DIAGNOSIS — Z87891 Personal history of nicotine dependence: Secondary | ICD-10-CM | POA: Insufficient documentation

## 2019-05-16 DIAGNOSIS — C3491 Malignant neoplasm of unspecified part of right bronchus or lung: Secondary | ICD-10-CM

## 2019-05-16 NOTE — Progress Notes (Signed)
Ms. Pavon presents today for f/u with Dr. Sondra Come. Pt reports fatigue is lingering. Pt denies c/o pain. Pt is not taking any medications by mouth due to difficulty swallowing. Pt reports occasional cough, usually non-productive. Pt denies hemoptysis. Pt reports occasional SOB with deep breaths or exertion.  BP (!) 135/57 (BP Location: Left Arm, Patient Position: Sitting)   Pulse 97   Temp 97.8 F (36.6 C) (Temporal)   Resp 18   Ht 5' 5"  (1.651 m)   SpO2 97%   BMI 35.11 kg/m   Wt Readings from Last 3 Encounters:  05/06/19 211 lb (95.7 kg)  04/15/19 212 lb 11.2 oz (96.5 kg)  04/01/19 215 lb 4.8 oz (97.7 kg)   Loma Sousa, RN BSN

## 2019-05-16 NOTE — Patient Instructions (Signed)
Coronavirus (COVID-19) Are you at risk?  Are you at risk for the Coronavirus (COVID-19)?  To be considered HIGH RISK for Coronavirus (COVID-19), you have to meet the following criteria:  . Traveled to [place], [place], [place], [place] or [place]; or in the [place] to [place], [place], [place], or [place]; and have fever, cough, and shortness of breath within the last 2 weeks of travel OR . Been in close contact with a person diagnosed with COVID-19 within the last 2 weeks and have fever, cough, and shortness of breath . IF YOU DO NOT MEET THESE CRITERIA, YOU ARE CONSIDERED LOW RISK FOR COVID-19.  What to do if you are HIGH RISK for COVID-19?  [name] If you are having a medical emergency, call 911. . Seek medical care right away. Before you go to a doctor's office, urgent care or emergency department, call ahead and tell them about your recent travel, contact with someone diagnosed with COVID-19, and your symptoms. You should receive instructions from your physician's office regarding next steps of care.  . When you arrive at healthcare provider, tell the healthcare staff immediately you have returned from visiting [place], [place], [place], [place] or [place]; or traveled in the [place] to [place], [place], [place], or [place]; in the last two weeks or you have been in close contact with a person diagnosed with COVID-19 in the last 2 weeks.   . Tell the health care staff about your symptoms: fever, cough and shortness of breath. . After you have been seen by a medical provider, you will be either: o Tested for (COVID-19) and discharged home on quarantine except to seek medical care if symptoms worsen, and asked to  - Stay home and avoid contact with others until you get your results (4-5 days)  - Avoid travel on public transportation if possible (such as bus, train, or airplane) or o Sent to the Emergency Department by EMS for evaluation, COVID-19 testing, and possible  admission depending on your condition and test results.  What to do if you are LOW RISK for COVID-19?  Reduce your risk of any infection by using the same precautions used for avoiding the common cold or flu:  [name] Wash your hands often with soap and warm water for at least 20 seconds.  If soap and water are not readily available, use an alcohol-based hand sanitizer with at least 60% alcohol.  . If coughing or sneezing, cover your mouth and nose by coughing or sneezing into the elbow areas of your shirt or coat, into a tissue or into your sleeve (not your hands). . Avoid shaking hands with others and consider head nods or verbal greetings only. . Avoid touching your eyes, nose, or mouth with unwashed hands.  . Avoid close contact with people who are sick. . Avoid places or events with large numbers of people in one location, like concerts or sporting events. . Carefully consider travel plans you have or are making. . If you are planning any travel outside or inside the [place], visit the CDC's Travelers' Health webpage for the latest health notices. . If you have some symptoms but not all symptoms, continue to monitor at home and seek medical attention if your symptoms worsen. . If you are having a medical emergency, call 911.   [place] / e-Visit: [email or web address]         [facility]: [place]: [facility]: [phone]

## 2019-05-23 NOTE — Telephone Encounter (Signed)
   YULIET NEEDS DOB: 1931/06/28 MRN: [id number]   RIDER WAIVER AND RELEASE OF LIABILITY  For purposes of improving physical access to our facilities, [facility] is pleased to partner with third parties to provide [facility] patients or other authorized individuals the option of convenient, on-demand ground transportation services (the [facility]") through use of the technology service that enables users to request on-demand ground transportation from independent third-party providers.  By opting to use and accept these [employer], I, the undersigned, hereby agree on behalf of myself, and on behalf of any minor child using the [employer] for whom I am the parent or legal guardian, as follows:  1. Government social research officer provided to me are provided by independent third-party transportation providers who are not [employer] or employees and who are unaffiliated with [employer]. 2. [facility] is neither a transportation carrier nor a common or public carrier. 3. [facility] has no control over the quality or safety of the transportation that occurs as a result of the [employer]. 4. [facility] cannot guarantee that any third-party transportation provider will complete any arranged transportation service. 5. [facility] makes no representation, warranty, or guarantee regarding the reliability, timeliness, quality, safety, suitability, or availability of any of the Transport Services or that they will be error free. 6. I fully understand that traveling by vehicle involves risks and dangers of serious bodily injury, including permanent disability, paralysis, and death. I agree, on behalf of myself and on behalf of any minor child using the Transport Services for whom I am the parent or legal guardian, that the entire risk arising out of my use of the [employer] remains solely with me, to the maximum extent permitted under applicable law. 7. The [employer] are provided "as is" and "as available." [facility] disclaims all representations and warranties, express, implied or statutory, not expressly set out in these terms, including the implied warranties of merchantability and fitness for a particular purpose. 8. I hereby waive and release [facility], its agents, employees, officers, directors, representatives, insurers, attorneys, assigns, successors, subsidiaries, and affiliates from any and all past, present, or future claims, demands, liabilities, actions, causes of action, or suits of any kind directly or indirectly arising from acceptance and use of the [employer]. 9. I further waive and release [facility] and its affiliates from all present and future liability and responsibility for any injury or death to persons or damages to property caused by or related to the use of the [employer]. 10. I have read this Waiver and Release of Liability, and I understand the terms used in it and their legal significance. This Waiver is freely and voluntarily given with the understanding that my right (as well as the right of any minor child for whom I am the parent or legal guardian using the [employer]) to legal recourse against [facility] in connection with the [employer] is knowingly surrendered in return for use of these services.   I attest that I read the consent document to Jerry Caras, gave Ms. Shrieves the opportunity to ask questions and answered the questions asked (if any). I affirm that Jerry Caras then provided consent for she's participation in this program.     Drucie Ip

## 2019-06-04 NOTE — Progress Notes (Signed)
Pharmacist Chemotherapy Monitoring - Follow Up Assessment    I verify that I have reviewed each item in the below checklist:  . Regimen for the patient is scheduled for the appropriate day and plan matches scheduled date. [name] Appropriate non-routine labs are ordered dependent on drug ordered. . If applicable, additional medications reviewed and ordered per protocol based on lifetime cumulative doses and/or treatment regimen.   Plan for follow-up and/or issues identified: No . I-vent associated with next due treatment: No . MD and/or nursing notified: No  Ashley Parker 06/04/2019 8:34 AM

## 2019-06-07 NOTE — Progress Notes (Signed)
[facility] OFFICE PROGRESS NOTE  Prince Solian, MD [place] [address]  DIAGNOSIS: Stage IIIc/IV (T4, N2, [name], poorly differentiated adenocarcinoma,  presented with large right upper lobe lung mass in addition to mediastinal lymphadenopathy and suspicious left upper lobe nodule diagnosed in December 2020.  Molecular studies by [facility]:  [id number] 0.2% Binimetinib  [id number] 4.8% None (VUS)    No (VUS)  [id number] 0.2% None (VUS)      No (VUS)  PRIOR THERAPY: Concurrent chemoradiation with weekly carboplatin for AUC of 2 and paclitaxel 45 mg/M2.  First dose of March 04, 2019.  Status post 7 cycles.  Last dose was given on April 15, 2019.  CURRENT THERAPY: Consolidation immunotherapy with Imfinzi 1500 mg IV every 4 weeks.  First dose May 13, 2019. Status post 1 cycle.   INTERVAL HISTORY: Ashley Parker 86 y.o. female returns to the clinic for a follow up visit. The patient is feeling well today without any concerning complaints except states she has been unable to swallow her pills for 2 months. She denies odynophagia. She describes the sensation of feeling as though food is "stuck" in her throat. She coughed up a piece of chicken last night that would not go down. She is able to tolerate liquids. She completed radiation in Feb 2021 without significant improvement in her swallowing since that time. She has a history of an esophageal stricture in 2013 which was dilated by her gastroenterologist, Dr. Carlean Purl. She lost 8 lbs since her last appointment. The patient continues to tolerate treatment with imfinzi well without any adverse effects. Denies any fever, chills, or night sweats. Denies any chest pain, cough, or hemoptysis but reports her baseline dyspnea on exertion. Denies any nausea, vomiting, or diarrhea. She reports constipation which she manages with Doculax.  Denies any headache or visual changes  besides her baseline visual blurring due to macular degeneration and glaucoma. Denies any rashes or skin changes. She had a diagnostic thoracentesis last month which showed atypical cells. The patient is here today for evaluation prior to starting cycle # 2   MEDICAL HISTORY: Past Medical History:  Diagnosis Date  . Arthritis   . Basal cell carcinoma (BCC) of skin of face 2016   removed in 2016  . COPD (chronic obstructive pulmonary disease) (HCC)    Mild  . Crohn's ileitis ([place])    Mild, mostly asymptomatic, not on therapy  . Fundic gland polyposis of stomach   . GERD (gastroesophageal reflux disease)   . Glaucoma   . Hemorrhoids    Internal and External  . History of hiatal hernia   . Hypertension   . Macular degeneration   . Obesity, unspecified   . Osteoporosis   . Pelvic fracture ([place]) 2007  . Stricture of esophagus    distal  . Unspecified vitamin D deficiency     ALLERGIES:  is allergic to wasp venom; biaxin [clarithromycin]; and brimonidine.  MEDICATIONS:  Current Outpatient Medications  Medication Sig Dispense Refill  . albuterol (VENTOLIN HFA) 108 (90 Base) MCG/ACT inhaler Inhale 1 puff into the lungs as needed for wheezing or shortness of breath.    . Ascorbic Acid (VITAMIN C) 500 MG CHEW Chew 500 mg by mouth daily.    . B Complex-C (SUPER B COMPLEX PO) Take 1 tablet by mouth daily.    . Calcium Carb-Cholecalciferol (CALCIUM 600+D3 PO) Take 1-2 tablets by mouth See admin instructions. Take 1 tablet in the morning & 2  tablet at night    . calcium elemental as carbonate (BARIATRIC TUMS ULTRA) 400 MG chewable tablet Chew 2,000 mg by mouth at bedtime.    . Cholecalciferol (VITAMIN D3) 2000 units TABS Take 2,000 Units by mouth daily.     . diclofenac Sodium (VOLTAREN) 1 % GEL Apply 1 application topically 4 (four) times daily as needed (pain.).     [name] dorzolamide (TRUSOPT) 2 % ophthalmic solution Place 1 drop into the left eye 2 (two) times daily.     [name] EPINEPHrine 0.3  mg/0.3 mL IJ SOAJ injection Inject 0.3 mg into the muscle as needed (for allergic reaction).    . Flaxseed, Linseed, (FLAXSEED OIL PO) Take 1 tablet by mouth daily.    . fluticasone (FLONASE) 50 MCG/ACT nasal spray Place 1 spray into both nostrils daily.     . irbesartan (AVAPRO) 300 MG tablet Take 300 mg by mouth daily.     [name] LUMIGAN 0.01 % SOLN Place 1 drop into the right eye at bedtime.   4  . Magnesium 250 MG TABS Take 250 mg by mouth daily.    . Multiple Vitamins-Minerals (PRESERVISION AREDS 2 PO) Take 1 tablet by mouth 2 (two) times daily.    . Omega-3 Fatty Acids (FISH OIL) 1000 MG CAPS Take 1,000 mg by mouth daily.    Vladimir Faster Glycol-Propyl Glycol (SYSTANE) 0.4-0.3 % SOLN Place 1 drop into both eyes 3 (three) times daily as needed (for tired/dry eyes.).     [name] prochlorperazine (COMPAZINE) 10 MG tablet Take 1 tablet (10 mg total) by mouth every 6 (six) hours as needed for nausea or vomiting. (Patient not taking: Reported on 05/16/2019) 30 tablet 0  . sucralfate (CARAFATE) 1 g tablet Take 1 tablet (1 g total) by mouth 4 (four) times daily -  with meals and at bedtime. Dissolve in 10 mL of warm water prior to swallowing (Patient not taking: Reported on 05/06/2019) 120 tablet 1  . timolol (TIMOPTIC) 0.5 % ophthalmic solution Place 1 drop into the left eye 2 (two) times daily.    . TURMERIC PO Take 1,500 mg by mouth daily.    . vitamin B-12 (CYANOCOBALAMIN) 1000 MCG tablet Take 1,000 mcg by mouth daily.    . vitamin E 400 UNIT capsule Take 400 Units by mouth daily. (180 mg)    . Zinc 30 MG TABS Take 30 mg by mouth daily.     No current facility-administered medications for this visit.   Facility-Administered Medications Ordered in Other Visits  Medication Dose Route Frequency Provider Last Rate Last Admin  . 0.9 %  sodium chloride infusion   Intravenous Once Curt Bears, MD      . durvalumab (IMFINZI) 1,500 mg in sodium chloride 0.9 % 100 mL chemo infusion  1,500 mg Intravenous Once  Curt Bears, MD        SURGICAL HISTORY:  Past Surgical History:  Procedure Laterality Date  . APPENDECTOMY    . BACK SURGERY    . CATARACT EXTRACTION    . COLONOSCOPY  07/27/2001, 07/08/2011   Multiple  . ESOPHAGEAL DILATION  2013  . ESOPHAGOGASTRODUODENOSCOPY     multiple  . LAPAROSCOPIC APPENDECTOMY N/A 02/19/2014   Procedure: APPENDECTOMY LAPAROSCOPIC;  Surgeon: Erroll Luna, MD;  Location: [facility];  Service: General;  Laterality: N/A;  . MINOR PLACEMENT OF FIDUCIAL  08/23/2017   Procedure: MINOR PLACEMENT OF FIDUCIAL - Left Upper Lobe Lung x2, Right Upper Lobe Lung x3;  Surgeon: Collene Gobble, MD;  Location: [facility];  Service: Thoracic;;  . TONSILLECTOMY AND ADENOIDECTOMY     and adenoids   . TOTAL KNEE ARTHROPLASTY Left 2007  . VIDEO BRONCHOSCOPY WITH ENDOBRONCHIAL NAVIGATION N/A 08/23/2017   Procedure: VIDEO BRONCHOSCOPY WITH ENDOBRONCHIAL NAVIGATION;  Surgeon: Collene Gobble, MD;  Location: [facility];  Service: Thoracic;  Laterality: N/A;  . VIDEO BRONCHOSCOPY WITH ENDOBRONCHIAL NAVIGATION N/A 01/31/2019   Procedure: VIDEO BRONCHOSCOPY WITH ENDOBRONCHIAL NAVIGATION;  Surgeon: Garner Nash, DO;  Location: [facility];  Service: Thoracic;  Laterality: N/A;  . VIDEO BRONCHOSCOPY WITH ENDOBRONCHIAL ULTRASOUND N/A 01/31/2019   Procedure: VIDEO BRONCHOSCOPY WITH ENDOBRONCHIAL ULTRASOUND;  Surgeon: Garner Nash, DO;  Location: [facility];  Service: Thoracic;  Laterality: N/A;  . WRIST FRACTURE SURGERY Left 2018   with screws    REVIEW OF SYSTEMS:   Review of Systems  Constitutional: Positive for weight loss. Negative for appetite change, chills, fatigue, fever. HENT: Positive for dysphagia. Denies odynophagia. Negative for mouth sores, nosebleeds, or sore throat.   Eyes: Negative for eye problems and icterus.  Respiratory: Positive for baseline dyspnea on exertion. Negative for cough, hemoptysis, and wheezing.   Cardiovascular: Negative for chest pain and leg swelling.   Gastrointestinal: Positive for occasional constipation. Negative for abdominal pain, diarrhea, nausea and vomiting.  Genitourinary: Negative for bladder incontinence, difficulty urinating, dysuria, frequency and hematuria.   Musculoskeletal: Negative for back pain, gait problem, neck pain and neck stiffness.  Skin: Negative for itching and rash.  Neurological: Negative for dizziness, extremity weakness, gait problem, headaches, light-headedness and seizures.  Hematological: Negative for adenopathy. Does not bruise/bleed easily.  Psychiatric/Behavioral: Negative for confusion, depression and sleep disturbance. The patient is not nervous/anxious.     PHYSICAL EXAMINATION:  Blood pressure 139/77, pulse 90, temperature 98 F (36.7 C), temperature source Temporal, resp. rate 17, height 5' 5"  (1.651 m), weight 203 lb 12.8 oz (92.4 kg), SpO2 96 %.  ECOG PERFORMANCE STATUS: 1 - Symptomatic but completely ambulatory  Physical Exam  Constitutional: Oriented to person, place, and time and well-developed, well-nourished, and in no distress.  HENT:  Head: Normocephalic and atraumatic.  Mouth/Throat: Oropharynx is clear and moist. No oropharyngeal exudate.  Eyes: Conjunctivae are normal. Right eye exhibits no discharge. Left eye exhibits no discharge. No scleral icterus.  Neck: Normal range of motion. Neck supple.  Cardiovascular: Normal rate, regular rhythm, normal heart sounds and intact distal pulses.   Pulmonary/Chest: Effort normal and breath sounds normal. No respiratory distress. No wheezes. No rales.  Abdominal: Soft. Bowel sounds are normal. Exhibits no distension and no mass. There is no tenderness.  Musculoskeletal: Normal range of motion. Exhibits no edema.  Lymphadenopathy:    No cervical adenopathy.  Neurological: Alert and oriented to person, place, and time. Exhibits normal muscle tone. Examined in the wheelchair.  Skin: Skin is warm and dry. No rash noted. Not diaphoretic. No  erythema. No pallor.  Psychiatric: Mood, memory and judgment normal.  Vitals reviewed.  LABORATORY DATA: Lab Results  Component Value Date   WBC 7.0 06/10/2019   HGB 15.0 06/10/2019   HCT 45.8 06/10/2019   MCV 91.6 06/10/2019   PLT 243 06/10/2019      Chemistry      Component Value Date/Time   NA 136 06/10/2019 1055   K 4.1 06/10/2019 1055   CL 105 06/10/2019 1055   CO2 20 (L) 06/10/2019 1055   BUN 12 06/10/2019 1055   CREATININE 0.74 06/10/2019 1055      Component Value Date/Time  CALCIUM 9.3 06/10/2019 1055   ALKPHOS 185 (H) 06/10/2019 1055   AST 33 06/10/2019 1055   ALT 39 06/10/2019 1055   BILITOT 1.4 (H) 06/10/2019 1055       RADIOGRAPHIC STUDIES:  DG Chest 1 View  Result Date: 05/13/2019 CLINICAL DATA:  Thoracentesis EXAM: CHEST  1 VIEW COMPARISON:  03/29/2019 FINDINGS: The heart size and mediastinal contours are stable. Right upper lobe mass/consolidation with fiducial markers and small left upper lobe area of nodularity with fiducial markers appear grossly unchanged compared to prior. Small right pleural effusion with hazy right basilar opacity. No left-sided pleural effusion. No pneumothorax. IMPRESSION: 1. No pneumothorax status post thoracentesis. 2. Small right pleural effusion with hazy right basilar opacity which may reflect atelectasis. 3. Stable right upper lobe mass/consolidation and left upper lobe nodularity with fiducial markers. Electronically Signed   By: Davina Poke D.O.   On: 05/13/2019 12:01   US Thoracentesis Asp Pleural space w/IMG guide  Result Date: 05/13/2019 INDICATION: Patient with history right sided lung adenocarcinoma presents for therapeutic and diagnostic right thoracentesis EXAM: ULTRASOUND GUIDED THERAPEUTIC AND DIAGNOSTIC THORACENTESIS MEDICATIONS: Lidocaine 1% 10 mL COMPLICATIONS: None immediate. PROCEDURE: An ultrasound guided thoracentesis was thoroughly discussed with the patient and questions answered. The benefits, risks,  alternatives and complications were also discussed. The patient understands and wishes to proceed with the procedure. Written consent was obtained. Ultrasound was performed to localize and mark an adequate pocket of fluid in the right chest. The area was then prepped and draped in the normal sterile fashion. 1% Lidocaine was used for local anesthesia. Under ultrasound guidance a 6 Fr Safe-T-Centesis catheter was introduced. Thoracentesis was performed. The catheter was removed and a dressing applied. FINDINGS: A total of approximately 100 mL of straw-colored fluid was removed. Samples were sent to the laboratory as requested by the clinical team. IMPRESSION: Successful ultrasound guided therapeutic and diagnostic right-sided thoracentesis yielding 100 mL of pleural fluid. Read by Rushie Nyhan NP Electronically Signed   By: Jacqulynn Cadet [name]   On: 05/13/2019 12:31     ASSESSMENT/PLAN:  This is a very pleasant 85 year old Caucasian female with a history of stage IIIc/IV non-small cell lung cancer, poorly differentiated adenocarcinoma.  She presented with a right upper lobe lung mass in addition to mediastinal lymphadenopathy and a suspicious left upper lobe nodule.  She had a diagnostic thoracentesis which just demonstrated atypical cells.  Will likely order cytology with future thoracenteses with cytology if she develops recurrent effusions.  She was diagnosed in December 2020.  She is negative for any actionable mutations.   She completed weekly concurrent chemoradiation with carboplatin for an AUC of 2 and paclitaxel 45 mg/m.  She is status post 7 cycles.  She is currently undergoing treatment with consolidation immunotherapy with Imfinzi 1500 mg IV every 4 weeks.  She is status post 1 cycle.  She tolerated well without any adverse side effects.   Labs are reviewed.  Recommend that she proceed with cycle #2 today scheduled.  We will see her back for follow-up visit in 4 weeks for evaluation  before starting cycle #3.  I will re-refer her back to Dr. Carlean Purl for evaluation of her dysphagia to determine if she is a candidate for endoscopy or consideration of evaluation of possible structure. In the meantime, the patient was encouraged to have a soft/liquid diet.   The patient was advised to call immediately if she has any concerning symptoms in the interval. The patient voices understanding of current disease  status and treatment options and is in agreement with the current care plan. All questions were answered. The patient knows to call the clinic with any problems, questions or concerns. We can certainly see the patient much sooner if necessary    Orders Placed This Encounter  Procedures  . Ambulatory referral to Gastroenterology    Referral Priority:   Routine    Referral Type:   Consultation    Referral Reason:   Specialty Services Required    Number of Visits Requested:   [place], PA-C 06/10/19

## 2019-06-10 ENCOUNTER — Inpatient Hospital Stay: Payer: PPO

## 2019-06-10 ENCOUNTER — Other Ambulatory Visit: Payer: Self-pay

## 2019-06-10 ENCOUNTER — Inpatient Hospital Stay: Payer: PPO | Attending: Internal Medicine | Admitting: Physician Assistant

## 2019-06-10 ENCOUNTER — Telehealth: Payer: Self-pay | Admitting: Physician Assistant

## 2019-06-10 VITALS — BP 139/77 | HR 90 | Temp 98.0°F | Resp 17 | Ht 65.0 in | Wt 203.8 lb

## 2019-06-10 DIAGNOSIS — C3491 Malignant neoplasm of unspecified part of right bronchus or lung: Secondary | ICD-10-CM

## 2019-06-10 DIAGNOSIS — Z923 Personal history of irradiation: Secondary | ICD-10-CM | POA: Diagnosis not present

## 2019-06-10 DIAGNOSIS — Z85828 Personal history of other malignant neoplasm of skin: Secondary | ICD-10-CM | POA: Insufficient documentation

## 2019-06-10 DIAGNOSIS — K5900 Constipation, unspecified: Secondary | ICD-10-CM | POA: Insufficient documentation

## 2019-06-10 DIAGNOSIS — R590 Localized enlarged lymph nodes: Secondary | ICD-10-CM | POA: Insufficient documentation

## 2019-06-10 DIAGNOSIS — C3411 Malignant neoplasm of upper lobe, right bronchus or lung: Secondary | ICD-10-CM | POA: Diagnosis not present

## 2019-06-10 DIAGNOSIS — J90 Pleural effusion, not elsewhere classified: Secondary | ICD-10-CM | POA: Insufficient documentation

## 2019-06-10 DIAGNOSIS — R1310 Dysphagia, unspecified: Secondary | ICD-10-CM | POA: Insufficient documentation

## 2019-06-10 DIAGNOSIS — I10 Essential (primary) hypertension: Secondary | ICD-10-CM | POA: Insufficient documentation

## 2019-06-10 DIAGNOSIS — J948 Other specified pleural conditions: Secondary | ICD-10-CM | POA: Diagnosis not present

## 2019-06-10 DIAGNOSIS — Z79899 Other long term (current) drug therapy: Secondary | ICD-10-CM | POA: Diagnosis not present

## 2019-06-10 DIAGNOSIS — Z5112 Encounter for antineoplastic immunotherapy: Secondary | ICD-10-CM | POA: Insufficient documentation

## 2019-06-10 DIAGNOSIS — Z8719 Personal history of other diseases of the digestive system: Secondary | ICD-10-CM | POA: Diagnosis not present

## 2019-06-10 LAB — CBC WITH DIFFERENTIAL (CANCER CENTER ONLY)
Abs Immature Granulocytes: 0.03 10*3/uL (ref 0.00–0.07)
Basophils Absolute: 0 10*3/uL (ref 0.0–0.1)
Basophils Relative: 1 %
Eosinophils Absolute: 0.8 10*3/uL — ABNORMAL HIGH (ref 0.0–0.5)
Eosinophils Relative: 11 %
HCT: 45.8 % (ref 36.0–46.0)
Hemoglobin: 15 g/dL (ref 12.0–15.0)
Immature Granulocytes: 0 %
Lymphocytes Relative: 9 %
Lymphs Abs: 0.6 10*3/uL — ABNORMAL LOW (ref 0.7–4.0)
MCH: 30 pg (ref 26.0–34.0)
MCHC: 32.8 g/dL (ref 30.0–36.0)
MCV: 91.6 fL (ref 80.0–100.0)
Monocytes Absolute: 0.5 10*3/uL (ref 0.1–1.0)
Monocytes Relative: 8 %
Neutro Abs: 5 10*3/uL (ref 1.7–7.7)
Neutrophils Relative %: 71 %
Platelet Count: 243 10*3/uL (ref 150–400)
RBC: 5 MIL/uL (ref 3.87–5.11)
RDW: 14.9 % (ref 11.5–15.5)
WBC Count: 7 10*3/uL (ref 4.0–10.5)
nRBC: 0 % (ref 0.0–0.2)

## 2019-06-10 LAB — CMP (CANCER CENTER ONLY)
ALT: 39 U/L (ref 0–44)
AST: 33 U/L (ref 15–41)
Albumin: 3.5 g/dL (ref 3.5–5.0)
Alkaline Phosphatase: 185 U/L — ABNORMAL HIGH (ref 38–126)
Anion gap: 11 (ref 5–15)
BUN: 12 mg/dL (ref 8–23)
CO2: 20 mmol/L — ABNORMAL LOW (ref 22–32)
Calcium: 9.3 mg/dL (ref 8.9–10.3)
Chloride: 105 mmol/L (ref 98–111)
Creatinine: 0.74 mg/dL (ref 0.44–1.00)
GFR, Est AFR Am: >60 mL/min (ref >60)
GFR, Estimated: >60 mL/min (ref >60)
Glucose, Bld: 127 mg/dL — ABNORMAL HIGH (ref 70–99)
Potassium: 4.1 mmol/L (ref 3.5–5.1)
Sodium: 136 mmol/L (ref 135–145)
Total Bilirubin: 1.4 mg/dL — ABNORMAL HIGH (ref 0.3–1.2)
Total Protein: 7.3 g/dL (ref 6.5–8.1)

## 2019-06-10 LAB — TSH: TSH: 2.558 u[IU]/mL (ref 0.308–3.960)

## 2019-06-10 MED ORDER — SODIUM CHLORIDE 0.9 % IV SOLN
1500.0000 mg | Freq: Once | INTRAVENOUS | Status: AC
  Administered 2019-06-10: 1500 mg via INTRAVENOUS
  Filled 2019-06-10: qty 30

## 2019-06-10 MED ORDER — SODIUM CHLORIDE 0.9 % IV SOLN
Freq: Once | INTRAVENOUS | Status: AC
  Filled 2019-06-10: qty 250

## 2019-06-10 NOTE — Telephone Encounter (Signed)
Scheduled per los. Printout given in infusion

## 2019-06-10 NOTE — Patient Instructions (Signed)
[facility] Discharge Instructions for Patients Receiving Chemotherapy  Today you received the following chemotherapy agents: durvalumab.  To help prevent nausea and vomiting after your treatment, we encourage you to take your nausea medication as directed.   If you develop nausea and vomiting that is not controlled by your nausea medication, call the clinic.   BELOW ARE SYMPTOMS THAT SHOULD BE REPORTED IMMEDIATELY:  *FEVER GREATER THAN 100.5 F  *CHILLS WITH OR WITHOUT FEVER  NAUSEA AND VOMITING THAT IS NOT CONTROLLED WITH YOUR NAUSEA MEDICATION  *UNUSUAL SHORTNESS OF BREATH  *UNUSUAL BRUISING OR BLEEDING  TENDERNESS IN MOUTH AND THROAT WITH OR WITHOUT PRESENCE OF ULCERS  *URINARY PROBLEMS  *BOWEL PROBLEMS  UNUSUAL RASH Items with * indicate a potential emergency and should be followed up as soon as possible.  Feel free to call the clinic should you have any questions or concerns. The clinic phone number is [phone].  Please show the CHEMO ALERT CARD at check-in to the Emergency Department and triage nurse.   

## 2019-06-20 ENCOUNTER — Encounter: Payer: Self-pay | Admitting: Nurse Practitioner

## 2019-06-20 ENCOUNTER — Ambulatory Visit: Payer: PPO | Admitting: Nurse Practitioner

## 2019-06-20 VITALS — BP 116/60 | HR 102 | Temp 97.3°F | Ht 65.0 in | Wt 204.0 lb

## 2019-06-20 DIAGNOSIS — R1319 Other dysphagia: Secondary | ICD-10-CM

## 2019-06-20 DIAGNOSIS — R1310 Dysphagia, unspecified: Secondary | ICD-10-CM

## 2019-06-20 NOTE — Patient Instructions (Addendum)
If you are age 84 or older, your body mass index should be between 23-30. Your Body mass index is 33.95 kg/m. If this is out of the aforementioned range listed, please consider follow up with your Primary Care Provider.  If you are age 48 or younger, your body mass index should be between 19-25. Your Body mass index is 33.95 kg/m. If this is out of the aformentioned range listed, please consider follow up with your Primary Care Provider.   Due to recent changes in healthcare laws, you may see the results of your imaging and laboratory studies on MyChart before your provider has had a chance to review them.  We understand that in some cases there may be results that are confusing or concerning to you. Not all laboratory results come back in the same time frame and the provider may be waiting for multiple results in order to interpret others.  Please give [place] 48 hours in order for your provider to thoroughly review all the results before contacting the office for clarification of your results.    You have been scheduled for a Barium Esophogram at [facility] (1st floor of the hospital) on 07/03/2019 at 10:30 am. Please arrive 15 minutes prior to your appointment for registration. Make certain not to have anything to eat or drink 3 hours prior to your test. If you need to reschedule for any reason, please contact radiology at [phone] to do so. __________________________________________________________________ A barium swallow is an examination that concentrates on views of the esophagus. This tends to be a double contrast exam (barium and two liquids which, when combined, create a gas to distend the wall of the oesophagus) or single contrast (non-ionic iodine based). The study is usually tailored to your symptoms so a good history is essential. Attention is paid during the study to the form, structure and configuration of the esophagus, looking for functional disorders (such as aspiration,  dysphagia, achalasia, motility and reflux) EXAMINATION You may be asked to change into a gown, depending on the type of swallow being performed. A radiologist and radiographer will perform the procedure. The radiologist will advise you of the type of contrast selected for your procedure and direct you during the exam. You will be asked to stand, sit or lie in several different positions and to hold a small amount of fluid in your mouth before being asked to swallow while the imaging is performed .In some instances you may be asked to swallow barium coated marshmallows to assess the motility of a solid food bolus. The exam can be recorded as a digital or video fluoroscopy procedure. POST PROCEDURE It will take 1-2 days for the barium to pass through your system. To facilitate this, it is important, unless otherwise directed, to increase your fluids for the next 24-48hrs and to resume your normal diet.  This test typically takes about 30 minutes to perform. __________________________________________________________________________________   Please drink 3 sips of water before swallowing any food or tablets. Cut food into small pieces chew food thoroughly   Call the office if your symptoms worsen  Thank you for choosing [facility] Noralyn Pick, CRNP

## 2019-06-20 NOTE — Progress Notes (Signed)
06/20/2019 Ashley Parker [id number] [birth year]   CHIEF COMPLAINT: Difficulty swallowing   HISTORY OF PRESENT ILLNESS:  Ashley Parker is a 86 year old female with a past medical history of arthritis, macular degeneration, glaucoma, past smoker, non-small cell lung cancer stage III/IV 01/2019 s/p chemo and radiation now on immunotherapy once monthly, COPD, GERD, esophageal stricture dilated in 2013, remote history of Crohn's ileitis. She presents today for further evaluation regarding dysphagia which started approximately 1 month ago. She reported eating chicken wings while at a restaurant two weeks ago which got stuck in her upper esophagus. She drank water which resulted in violent coughing, eventually gagged and the stuck food was expelled. She is eating soft "mushy" foods since then. One week ago, she ate prime rib and a piece of meet got stuck to the upper esophagus, she coughed and gagged until the stuck food was expelled. She is also having difficulty swallowing flat pills which get stuck in her throat, she feels choked then coughs up the stuck pill. Sometimes feels a lump sensation in her throat. Her pill dysphagia started about 2 months ago. She elected to stop taking all of her supplements with concerns the various capsules and tablets would get stuck. She hasn't taken her BP medication in the past month. She denies having any heartburn or stomach pain. No nausea or vomiting. She reports losing 25 lbs over the past 3 to 4 months due to stress and eating less due to her swallowing symptoms. She has constipation, passing pellet like stools. She is taking Dulcolax every 4 days which results in passing a larger BM. No rectal bleeding or melena. History of Crohn's ileitis. Her most recent colonoscopy was 06/2011 which showed chronic active ileitis and one hyperplastic colon polyp was removed. She was prescribed Entocort for few months then no further treatment was required as she remained  asymptomatic. She was last seen in our office by Dr. Carlean Purl in 2014, at that time she had occasional diarrhea but was overall stable. She was advised to follow up in office in 1 year.   History of a large right upper lobe lung mass with mediastinal lymphadenopathy and a suspicious left upper lobe nodule. She was diagnosed with  stage IIIc/IV non-small cell lung cancer, poorly differentiated adenocarcinoma 01/2019.  She is followed by oncologist Dr. Julien Nordmann and Cassandra Heilingoetter PA-C. She received Carboplatin/Paclitaxel/Radiation 03/04/2019-04/15/2019. She developed a right pleural effusion. She underwent a thoracentesis 05/13/2019, 178m of pleural fluid was removed which showed atypical cells. She is not receiving immunotherapy (Imfinzi) once monthly, 2 infusions thus far.   She was sitting on her husband's walker/chair a few days ago and she hurt her left shoulder and back, no significant injury or bruising.   CBC Latest Ref Rng & Units 06/10/2019 05/14/2019 05/03/2019  WBC 4.0 - 10.5 K/uL 7.0 6.6 4.3  Hemoglobin 12.0 - 15.0 g/dL 15.0 14.1 13.5  Hematocrit 36.0 - 46.0 % 45.8 43.0 41.4  Platelets 150 - 400 K/uL 243 215 234   CMP Latest Ref Rng & Units 06/10/2019 05/14/2019 05/03/2019  Glucose 70 - 99 mg/dL 127(H) 106(H) 127(H)  BUN 8 - 23 mg/dL _0 Creatinine 0.44 - 1.00 mg/dL 0.74 0.71 0.77  Sodium 135 - 145 mmol/L 136 140 139  Potassium 3.5 - 5.1 mmol/L 4.1 3.8 3.9  Chloride 98 - 111 mmol/L 105 109 107  CO2 22 - 32 mmol/L 20(L) 21(L) 24  Calcium 8.9 - 10.3 mg/dL 9.3 9.0  9.3  Total Protein 6.5 - 8.1 g/dL 7.3 7.0 6.9  Total Bilirubin 0.3 - 1.2 mg/dL 1.4(H) 1.0 0.7  Alkaline Phos 38 - 126 U/L 185(H) 170(H) 176(H)  AST 15 - 41 U/L 33 41 38  ALT 0 - 44 U/L 39 43 46(H)   EGD 07/08/2011: Distal esophageal stricture 17m in diameter. Dilated to 54French.  2 cm Hiatal hernia  6-737msessile fundic gland polyp in the gastric body  Colonoscopy 07/08/2011:  Multiple small ulcers at the TI.  Biopsies showed chronic active ileitis.  Diminutive hyperplastic polyp sigmoid colon Diverticulosis sigmoid colon Internal and external hemorrhoids    Past Medical History:  Diagnosis Date   Arthritis    Basal cell carcinoma (BCC) of skin of face 2016   removed in 2016   COPD (chronic obstructive pulmonary disease) (HCC)    Mild   Crohn's ileitis (HCC)    Mild, mostly asymptomatic, not on therapy   Fundic gland polyposis of stomach    GERD (gastroesophageal reflux disease)    Glaucoma    Hemorrhoids    Internal and External   History of hiatal hernia    Hypertension    Macular degeneration    Obesity, unspecified    Osteoporosis    [facility]elvic [facility] [id number]   Stricture of esophagus    distal   Unspecified vitamin D deficiency    Past Surgical History:  Procedure Laterality Date   APPENDECTOMY     BACK SURGERY     CATARACT EXTRACTION     COLONOSCOPY  07/27/2001, 07/08/2011   Multiple   ESOPHAGEAL DILATION  2013   ESOPHAGOGASTRODUODENOSCOPY     multiple   LAPAROSCOPIC APPENDECTOMY N/A 02/19/2014   Procedure: APPENDECTOMY LAPAROSCOPIC;  Surgeon: ThErroll LunaMD;  [facility]: [facility];  Laterality: N/A;   MINOR PLACEMENT OF FIDUCIAL  08/23/2017   Procedure: MINOR PLACEMENT OF FIDUCIAL - Left Upper Lobe Lung x2, Right Upper Lobe Lung x3;  Surgeon: ByCollene GobbleMD;  [facility]: Thoracic;;   TONSILLECTOMY AND ADENOIDECTOMY     and adenoids    TOTAL KNEE ARTHROPLASTY Left 2007   VIDEO BRONCHOSCOPY WITH ENDOBRONCHIAL NAVIGATION N/A 08/23/2017   Procedure: VIDEO BRONCHOSCOPY WITH ENDOBRONCHIAL NAVIGATION;  Surgeon: ByCollene GobbleMD;  Location: [facility];  Service: Thoracic;  Laterality: N/A;   VIDEO BRONCHOSCOPY WITH ENDOBRONCHIAL NAVIGATION N/A 01/31/2019   Procedure: VIDEO BRONCHOSCOPY WITH ENDOBRONCHIAL NAVIGATION;  Surgeon: IcGarner NashDO;  [facility]: Thoracic;  Laterality: N/A;   VIDEO  BRONCHOSCOPY WITH ENDOBRONCHIAL ULTRASOUND N/A 01/31/2019   Procedure: VIDEO BRONCHOSCOPY WITH ENDOBRONCHIAL ULTRASOUND;  Surgeon: IcGarner NashDO;  [facility]: Thoracic;  Laterality: N/A;   WRIST FRACTURE SURGERY Left 2018   with screws    reports that she quit smoking about 38 years ago. Her smoking use included cigarettes. She has a 32.00 pack-year smoking history. She has never used smokeless tobacco. She reports previous alcohol use. She reports that she does not use drugs. family history includes Breast cancer in her mother; Heart disease in her mother; Prostate cancer in her father; Uterine cancer in her mother. Allergies  Allergen Reactions   Wasp Venom Anaphylaxis and Other (See Comments)    Severe pain in lower stomach, sweatiness, and BP drops low    Biaxin [Clarithromycin] Other (See Comments)    MYALGIAS ACHES   Brimonidine Dermatitis and Other (See Comments)    Redness/Peeling of skin  Outpatient Encounter Medications as of 06/20/2019  Medication Sig   irbesartan (AVAPRO) 300 MG tablet Take 300 mg by mouth daily.    timolol (TIMOPTIC) 0.5 % ophthalmic solution Place 1 drop into the left eye 2 (two) times daily.   albuterol (VENTOLIN HFA) 108 (90 Base) MCG/ACT inhaler Inhale 1 puff into the lungs as needed for wheezing or shortness of breath.   Ascorbic Acid (VITAMIN C) 500 MG CHEW Chew 500 mg by mouth daily.   B Complex-C (SUPER B COMPLEX PO) Take 1 tablet by mouth daily.   Calcium Carb-Cholecalciferol (CALCIUM 600+D3 PO) Take 1-2 tablets by mouth See admin instructions. Take 1 tablet in the morning & 2 tablet at night   calcium elemental as carbonate (BARIATRIC TUMS ULTRA) 400 MG chewable tablet Chew 2,000 mg by mouth at bedtime.   Cholecalciferol (VITAMIN D3) 2000 units TABS Take 2,000 Units by mouth daily.    diclofenac Sodium (VOLTAREN) 1 % GEL Apply 1 application topically 4 (four) times daily as needed (pain.).    dorzolamide  (TRUSOPT) 2 % ophthalmic solution Place 1 drop into the left eye 2 (two) times daily.    EPINEPHrine 0.3 mg/0.3 mL IJ SOAJ injection Inject 0.3 mg into the muscle as needed (for allergic reaction).   Flaxseed, Linseed, (FLAXSEED OIL PO) Take 1 tablet by mouth daily.   fluticasone (FLONASE) 50 MCG/ACT nasal spray Place 1 spray into both nostrils daily.    LUMIGAN 0.01 % SOLN Place 1 drop into the right eye at bedtime.    Magnesium 250 MG TABS Take 250 mg by mouth daily.   Multiple Vitamins-Minerals (PRESERVISION AREDS 2 PO) Take 1 tablet by mouth 2 (two) times daily.   Omega-3 Fatty Acids (FISH OIL) 1000 MG CAPS Take 1,000 mg by mouth daily.   Polyethyl Glycol-Propyl Glycol (SYSTANE) 0.4-0.3 % SOLN Place 1 drop into both eyes 3 (three) times daily as needed (for tired/dry eyes.).    TURMERIC PO Take 1,500 mg by mouth daily.   vitamin B-12 (CYANOCOBALAMIN) 1000 MCG tablet Take 1,000 mcg by mouth daily.   vitamin E 400 UNIT capsule Take 400 Units by mouth daily. (180 mg)   Zinc 30 MG TABS Take 30 mg by mouth daily.   [DISCONTINUED] prochlorperazine (COMPAZINE) 10 MG tablet Take 1 tablet (10 mg total) by mouth every 6 (six) hours as needed for nausea or vomiting. (Patient not taking: Reported on 05/16/2019)   [DISCONTINUED] sucralfate (CARAFATE) 1 g tablet Take 1 tablet (1 g total) by mouth 4 (four) times daily -  with meals and at bedtime. Dissolve in 10 mL of warm water prior to swallowing (Patient not taking: Reported on 05/06/2019)   No facility-administered encounter medications on file as of 06/20/2019.     REVIEW OF SYSTEMS: All other systems reviewed and negative except where noted in the History of Present Illness.   PHYSICAL EXAM: BP 116/60    Pulse (!) 102    Temp (!) 97.3 F (36.3 C)    Ht 5' 5" (1.651 m)    Wt 204 lb (92.5 kg)    BMI 33.95 kg/m  General: 86 year old female in NAD.  Head: Normocephalic and atraumatic. Alopecia.  Eyes:  Sclerae non-icteric, conjunctive  pink. Ears: Normal auditory acuity. Mouth: No ulcers or lesions.  Neck: Supple, no lymphadenopathy or thyromegaly.  Lungs: Clear bilaterally to auscultation without wheezes, crackles or rhonchi. Heart: Regular rate and rhythm. No murmur, rub or gallop appreciated.  Abdomen: Soft, protuberant, nontender, non  distended. No masses. No hepatosplenomegaly. Normoactive bowel sounds x 4 quadrants.  Rectal: Deferred.  Musculoskeletal: Symmetrical with no gross deformities. Skin: Warm and dry. No rash or lesions on visible extremities. Extremities: No edema. Neurological: Alert oriented x 4, no focal deficits.  Psychological:  Alert and cooperative. Normal mood and affect.  ASSESSMENT AND PLAN:  55. 86 year old female diagnosed a history of GERD and an esophageal stricture diagnosed with adenocarcinoma of the right lung stage III s/p chemo, radiation on immunotherapy presents for further evaluation of solid food and pill dysphagia. Right pleural effusion s/p thoracentesis  05/13/2019 which showed atypical cells.  -Eventual EGD at [facility], will consult with Dr.Gessner prior to scheduling as patient is considered high risk for endoscopic evaluation in the setting of lung cancer  with radiation tx, recent thoracentesis and COPD.  -Barium swallow with tablet  -Patient instructed to drink 3 sips of water before swallowing any food or pills/tablets  -Soft to pureed food -Patient to call our office if her symptoms worsen   2. Weight loss secondary to # 1  3. Elevated alk phos 185 and T. Bili 1.4 with ALT 46 -> 39.  No recent abdominal imaging.  -Defer abdominal imaging to oncology.   4. Remote history of Crohn's ileitis      CC:  Heilingoetter, Cassandr*

## 2019-07-02 NOTE — Progress Notes (Signed)
Pharmacist Chemotherapy Monitoring - Follow Up Assessment    I verify that I have reviewed each item in the below checklist:  . Regimen for the patient is scheduled for the appropriate day and plan matches scheduled date. [name] Appropriate non-routine labs are ordered dependent on drug ordered. . If applicable, additional medications reviewed and ordered per protocol based on lifetime cumulative doses and/or treatment regimen.   Plan for follow-up and/or issues identified: No . I-vent associated with next due treatment: No . MD and/or nursing notified: No  Ashley Parker 07/02/2019 2:20 PM

## 2019-07-03 ENCOUNTER — Ambulatory Visit (HOSPITAL_COMMUNITY)
Admission: RE | Admit: 2019-07-03 | Discharge: 2019-07-03 | Disposition: A | Discharge status: Home / Self Care | Payer: PPO | Source: Ambulatory Visit | Attending: Nurse Practitioner | Admitting: Nurse Practitioner

## 2019-07-03 ENCOUNTER — Other Ambulatory Visit: Payer: Self-pay

## 2019-07-03 DIAGNOSIS — K222 Esophageal obstruction: Secondary | ICD-10-CM | POA: Diagnosis not present

## 2019-07-03 DIAGNOSIS — R1319 Other dysphagia: Secondary | ICD-10-CM

## 2019-07-03 DIAGNOSIS — R1310 Dysphagia, unspecified: Secondary | ICD-10-CM | POA: Diagnosis not present

## 2019-07-04 NOTE — Progress Notes (Signed)
[facility] OFFICE PROGRESS NOTE  Prince Solian, [name], N2, [name], poorly differentiated adenocarcinoma, presented with large right upper lobe lung mass in addition to mediastinal lymphadenopathy and suspicious left upper lobe nodule diagnosed in December 2020.  Molecular studies by [facility]:  [id number] 0.2% Binimetinib  [id number] 4.8% None (VUS)No (VUS)  [id number] 0.2% None (VUS)No (VUS)  PRIOR THERAPY: Concurrent chemoradiation with weekly carboplatin for AUC of 2 and paclitaxel 45 mg/M2. First dose of March 04, 2019. Status post 7cycles.Last dose was given on April 15, 2019.  CURRENT THERAPY: Consolidation immunotherapy with Imfinzi 1500 mg IV every 4 weeks. First dose May 13, 2019. Status post 2 cycles.   INTERVAL HISTORY: Ashley Parker 86 y.o. female returns to clinic today for follow-up visit.  At the patient's last appointment, she had been experiencing dysphagia.  She has a history of an esophageal stricture in 2013 which was dilated under the care of Dr. Carlean Purl.  The patient was referred to GI who performed a barium swallow study last week which demonstrated a short segment of esophageal stricture with smooth margins at the thoracic inlet at the junction of the cervical and thoracic esophagus, there was a 13 mm barium tablet that was lodged in this area. She still is endorsing difficulty swallowing her pills. She lost approximately 5 lbs pounds since her last appointment. She has been trying to eat a soft diet.   The patient also mentions that a few weeks ago she was trying to sit down and fell about 1.5-2 ft on her bottom. She noted some pain in the sacral  region for which she took an old prescription of tramadol and gabapentin which was prescribed in 2017 for a compression fracture. She did not seek medical evaluation for this and  notes her normal level of activity/range of motion. She notes the tramadol helps her fall asleep as well. She also mentions she does not frequently get a cough but had a coughing fit a few weeks ago and felt a "pop" and pain in her left rib cage. Her pain has subsided at this time.   Otherwise, the patient has been tolerating her treatment with immunotherapy with Imfinzi well without any concerning adverse side effects.  She denies any recent fever, chills, night sweats.  She denies any chest pain, cough, hemoptysis but reports her baseline dyspnea on exertion.  She denies any nausea, vomiting, diarrhea.  She reports her baseline constipation for which she manages successfully with Dulcolax.  Denies any headache or visual changes besides her baseline visual blurring due to macular degeneration and glaucoma. Denies any rashes or skin changes. She had a diagnostic thoracentesis last month which showed atypical cells.  The patient is here today for evaluation prior to starting cycle # 3   MEDICAL HISTORY: Past Medical History:  Diagnosis Date  . Arthritis   . Basal cell carcinoma (BCC) of skin of face 2016   removed in 2016  . COPD (chronic obstructive pulmonary disease) (HCC)    Mild  . Crohn's ileitis ([place])    Mild, mostly asymptomatic, not on therapy  . Fundic gland polyposis of stomach   . GERD (gastroesophageal reflux disease)   . Glaucoma   . Hemorrhoids    Internal and External  . History of hiatal hernia   . Hypertension   . Macular degeneration   . Obesity, unspecified   . Osteoporosis   . Pelvic  fracture ([place]) 2007  . Stricture of esophagus    distal  . Unspecified vitamin D deficiency     ALLERGIES:  is allergic to wasp venom; biaxin [clarithromycin]; and brimonidine.  MEDICATIONS:  Current Outpatient Medications  Medication Sig Dispense Refill  . gabapentin (NEURONTIN) 300 MG capsule Take 300 mg by mouth 2 (two) times daily.    . traMADol (ULTRAM) 50 MG tablet Take 50 mg  by mouth 2 (two) times daily.    [name] albuterol (VENTOLIN HFA) 108 (90 Base) MCG/ACT inhaler Inhale 1 puff into the lungs as needed for wheezing or shortness of breath.    . Ascorbic Acid (VITAMIN C) 500 MG CHEW Chew 500 mg by mouth daily.    . B Complex-C (SUPER B COMPLEX PO) Take 1 tablet by mouth daily.    . Calcium Carb-Cholecalciferol (CALCIUM 600+D3 PO) Take 1-2 tablets by mouth See admin instructions. Take 1 tablet in the morning & 2 tablet at night    . calcium elemental as carbonate (BARIATRIC TUMS ULTRA) 400 MG chewable tablet Chew 2,000 mg by mouth at bedtime.    . Cholecalciferol (VITAMIN D3) 2000 units TABS Take 2,000 Units by mouth daily.     . diclofenac Sodium (VOLTAREN) 1 % GEL Apply 1 application topically 4 (four) times daily as needed (pain.).     [name] dorzolamide (TRUSOPT) 2 % ophthalmic solution Place 1 drop into the left eye 2 (two) times daily.     [name] EPINEPHrine 0.3 mg/0.3 mL IJ SOAJ injection Inject 0.3 mg into the muscle as needed (for allergic reaction).    . Flaxseed, Linseed, (FLAXSEED OIL PO) Take 1 tablet by mouth daily.    . fluticasone (FLONASE) 50 MCG/ACT nasal spray Place 1 spray into both nostrils daily.     . irbesartan (AVAPRO) 300 MG tablet Take 300 mg by mouth daily.     [name] LUMIGAN 0.01 % SOLN Place 1 drop into the right eye at bedtime.   4  . Magnesium 250 MG TABS Take 250 mg by mouth daily.    . Multiple Vitamins-Minerals (PRESERVISION AREDS 2 PO) Take 1 tablet by mouth 2 (two) times daily.    . Omega-3 Fatty Acids (FISH OIL) 1000 MG CAPS Take 1,000 mg by mouth daily.    Vladimir Faster Glycol-Propyl Glycol (SYSTANE) 0.4-0.3 % SOLN Place 1 drop into both eyes 3 (three) times daily as needed (for tired/dry eyes.).     [name] timolol (TIMOPTIC) 0.5 % ophthalmic solution Place 1 drop into the left eye 2 (two) times daily.    . TURMERIC PO Take 1,500 mg by mouth daily.    . vitamin B-12 (CYANOCOBALAMIN) 1000 MCG tablet Take 1,000 mcg by mouth daily.    . vitamin E 400 UNIT  capsule Take 400 Units by mouth daily. (180 mg)    . Zinc 30 MG TABS Take 30 mg by mouth daily.     No current facility-administered medications for this visit.   Facility-Administered Medications Ordered in Other Visits  Medication Dose Route Frequency Provider Last Rate Last Admin  . 0.9 %  sodium chloride infusion   Intravenous Once Curt Bears, MD   Stopped at 07/08/19 1154    SURGICAL HISTORY:  Past Surgical History:  Procedure Laterality Date  . APPENDECTOMY    . BACK SURGERY    . CATARACT EXTRACTION    . COLONOSCOPY  07/27/2001, 07/08/2011   Multiple  . ESOPHAGEAL DILATION  2013  . ESOPHAGOGASTRODUODENOSCOPY     multiple  .  LAPAROSCOPIC APPENDECTOMY N/A 02/19/2014   Procedure: APPENDECTOMY LAPAROSCOPIC;  Surgeon: Erroll Luna, MD;  Location: [facility];  Service: General;  Laterality: N/A;  . MINOR PLACEMENT OF FIDUCIAL  08/23/2017   Procedure: MINOR PLACEMENT OF FIDUCIAL - Left Upper Lobe Lung x2, Right Upper Lobe Lung x3;  Surgeon: Collene Gobble, MD;  Location: [facility];  Service: Thoracic;;  . TONSILLECTOMY AND ADENOIDECTOMY     and adenoids   . TOTAL KNEE ARTHROPLASTY Left 2007  . VIDEO BRONCHOSCOPY WITH ENDOBRONCHIAL NAVIGATION N/A 08/23/2017   Procedure: VIDEO BRONCHOSCOPY WITH ENDOBRONCHIAL NAVIGATION;  Surgeon: Collene Gobble, MD;  Location: [facility];  Service: Thoracic;  Laterality: N/A;  . VIDEO BRONCHOSCOPY WITH ENDOBRONCHIAL NAVIGATION N/A 01/31/2019   Procedure: VIDEO BRONCHOSCOPY WITH ENDOBRONCHIAL NAVIGATION;  Surgeon: Garner Nash, DO;  Location: [facility];  Service: Thoracic;  Laterality: N/A;  . VIDEO BRONCHOSCOPY WITH ENDOBRONCHIAL ULTRASOUND N/A 01/31/2019   Procedure: VIDEO BRONCHOSCOPY WITH ENDOBRONCHIAL ULTRASOUND;  Surgeon: Garner Nash, DO;  Location: [facility];  Service: Thoracic;  Laterality: N/A;  . WRIST FRACTURE SURGERY Left 2018   with screws    REVIEW OF SYSTEMS:   Review of Systems  Constitutional: Positive for weight loss and appetite change  secondary to dysphagia. Negative for chills, fatigue, and fever. HENT: Positive for dysphagia. Denies odynophagia. Negative for mouth sores, nosebleeds, or sore throat.   Eyes: Negative for eye problems and icterus.  Respiratory: Positive for baseline dyspnea on exertion. Negative for cough, hemoptysis, and wheezing.   Cardiovascular: Positive for lateral left chest pain (resolved). Negative for leg swelling.  Gastrointestinal: Positive for occasional constipation. Negative for abdominal pain, diarrhea, nausea and vomiting.  Genitourinary: Negative for bladder incontinence, difficulty urinating, dysuria, frequency and hematuria.   Musculoskeletal: Positive for sacral pain (improved). Positive for left lateral chest wall pain (resolved). Negative for back pain, gait problem, neck pain and neck stiffness.  Skin: Negative for itching and rash.  Neurological: Negative for dizziness, extremity weakness, gait problem, headaches, light-headedness and seizures.  Hematological: Negative for adenopathy. Does not bruise/bleed easily.  Psychiatric/Behavioral: Negative for confusion, depression and sleep disturbance. The patient is not nervous/anxious.    PHYSICAL EXAMINATION:  Blood pressure 130/72, pulse 100, temperature 98.2 F (36.8 C), temperature source Temporal, resp. rate 17, height 5' 5"  (1.651 m), weight 199 lb 9.6 oz (90.5 kg), SpO2 100 %.  ECOG PERFORMANCE STATUS: 2 - Symptomatic, <50% confined to bed  Physical Exam  Constitutional: Oriented to person, place, and time and well-developed, well-nourished, and in no distress.  HENT:  Head: Normocephalic and atraumatic.  Mouth/Throat: Oropharynx is clear and moist. No oropharyngeal exudate.  Eyes: Conjunctivae are normal. Right eye exhibits no discharge. Left eye exhibits no discharge. No scleral icterus.  Neck: Normal range of motion. Neck supple.  Cardiovascular: Normal rate, regular rhythm, normal heart sounds and intact distal pulses.    Pulmonary/Chest: Effort normal and breath sounds normal. No respiratory distress. No wheezes. No rales.  Abdominal: Soft. Bowel sounds are normal. Exhibits no distension and no mass. There is no tenderness.  Musculoskeletal: Normal range of motion. Exhibits no edema. No tenderness to palpation on the chest wall. No tenderness to the vertebra.  Lymphadenopathy:    No cervical adenopathy.  Neurological: Alert and oriented to person, place, and time. Exhibits normal muscle tone. Examined in the wheelchair.  Skin: Skin is warm and dry. No rash noted. Not diaphoretic. No erythema. No pallor.  Psychiatric: Mood, memory and judgment normal.  Vitals reviewed.  LABORATORY DATA: Lab Results  Component Value Date   WBC 6.5 07/08/2019   HGB 14.8 07/08/2019   HCT 46.6 (H) 07/08/2019   MCV 94.1 07/08/2019   PLT 223 07/08/2019      Chemistry      Component Value Date/Time   NA 143 07/08/2019 0847   K 3.8 07/08/2019 0847   CL 107 07/08/2019 0847   CO2 21 (L) 07/08/2019 0847   BUN 16 07/08/2019 0847   CREATININE 0.80 07/08/2019 0847      Component Value Date/Time   CALCIUM 9.1 07/08/2019 0847   ALKPHOS 214 (H) 07/08/2019 0847   AST 28 07/08/2019 0847   ALT 29 07/08/2019 0847   BILITOT 0.9 07/08/2019 0847       RADIOGRAPHIC STUDIES:  DG ESOPHAGUS W DOUBLE CM (HD)  Result Date: 07/03/2019 CLINICAL DATA:  86 year old female with history of right upper lung cancer status post chemoradiation therapy, with ongoing immunotherapy. Reported remote history of dilated esophageal stricture. Patient presents with dysphagia and globus sensation for 1 month. EXAM: ESOPHOGRAM/BARIUM SWALLOW TECHNIQUE: Single contrast examination was performed using  thin barium. FLUOROSCOPY TIME:  Fluoroscopy Time:  1 minutes 36 seconds Radiation Exposure Index (if provided by the fluoroscopic device): 14.5 mGy Number of Acquired Spot Images: 5 COMPARISON:  05/03/2019 chest CT. FINDINGS: Examination significantly limited  by patient mobility restrictions. Patient unable to lie prone. Imaging conducted with patient lying supine with head of table elevated. No tracheobronchial aspiration observed. There is a short segment of relatively smooth narrowing at the junction of the cervical and thoracic esophagus at the thoracic inlet, at which location the 13 mm barium tablet became lodged for approximately 30 minutes, eventually dissolving and passing into the stomach while drinking warm liquids. There is also mild smooth narrowing of the lower thoracic esophagus just above the esophagogastric junction. Small hiatal hernia. Esophageal motility appears grossly normal. No filling defects or ulcers seen in the esophagus. Unable to assess for gastroesophageal reflux. IMPRESSION: 1. Short segment esophageal stricture with smooth margins at the thoracic inlet at the junction of the cervical and thoracic esophagus, at which location the 13 mm barium tablet became lodged, see comments. 2. Mild smooth narrowing of the lower thoracic esophagus just above the esophagogastric junction, cannot exclude an additional stricture in this location. 3. No discrete esophageal mass or ulcer. 4. Small hiatal hernia. Electronically Signed   By: Ilona Sorrel [name]   On: 07/03/2019 12:22     ASSESSMENT/PLAN:  This is a very pleasant 86 year old Caucasian female with a history of stage IIIc/IV non-small cell lung cancer, poorly differentiated adenocarcinoma.  She presented with a right upper lobe lung mass in addition to mediastinal lymphadenopathy and a suspicious left upper lobe nodule.  She had a diagnostic thoracentesis which just demonstrated atypical cells.  Will likely order cytology with future thoracenteses with cytology if she develops recurrent effusions.  She was diagnosed in December 2020.  She is negative for any actionable mutations.   She completed weekly concurrent chemoradiation with carboplatin for an AUC of 2 and paclitaxel 45 mg/m.  She  is status post 7 cycles.  She is currently undergoing treatment with consolidation immunotherapy with Imfinzi 1500 mg IV every 4 weeks.  She is status post 2 cycles.  She tolerated well without any adverse side effects.   Labs are reviewed.  Recommend that she proceed with cycle #3 today scheduled.  I will arrange for restaging CT scan to be performed prior to her next visit.  We will also assess the left lateral region where she experienced pain a few weeks ago. Pain resolved at this time. Discussed with Dr. Julien Nordmann who recommended that we evaluate this on her upcoming CT scan. The patient was advised if she develops recurrent pain, worsening pain, or new symptoms in the interval, to call us for evaluation. Advised the patient to take tylenol if needed and heating pads.   We will see her back for follow-up visit in 4 weeks for evaluation before starting cycle #4.   The patient was advised to follow up with her GI provider for recommendations regarding her dysphagia.    The patient was advised to call immediately if she has any concerning symptoms in the interval. The patient voices understanding of current disease status and treatment options and is in agreement with the current care plan. All questions were answered. The patient knows to call the clinic with any problems, questions or concerns. We can certainly see the patient much sooner if necessary.   Orders Placed This Encounter  Procedures  . CT Chest W Contrast    Standing Status:   Future    Standing Expiration Date:   07/07/2020    Order Specific Question:   ** REASON FOR EXAM (FREE TEXT)    Answer:   Restaging Lung Cancer    Order Specific Question:   If indicated for the ordered procedure, I authorize the administration of contrast media per Radiology protocol    Answer:   Yes    Order Specific Question:   Preferred imaging location?    Answer:   [facility]    Order Specific Question:   Radiology Contrast Protocol -  do NOT remove file path    Answer:   [email or web address]     [place], PA-C 07/08/19

## 2019-07-08 ENCOUNTER — Other Ambulatory Visit: Payer: Self-pay

## 2019-07-08 ENCOUNTER — Inpatient Hospital Stay: Payer: PPO | Attending: Internal Medicine | Admitting: Physician Assistant

## 2019-07-08 ENCOUNTER — Inpatient Hospital Stay: Payer: PPO

## 2019-07-08 VITALS — BP 130/72 | HR 100 | Temp 98.2°F | Resp 17 | Ht 65.0 in | Wt 199.6 lb

## 2019-07-08 DIAGNOSIS — I10 Essential (primary) hypertension: Secondary | ICD-10-CM | POA: Diagnosis not present

## 2019-07-08 DIAGNOSIS — Z8719 Personal history of other diseases of the digestive system: Secondary | ICD-10-CM | POA: Diagnosis not present

## 2019-07-08 DIAGNOSIS — Z923 Personal history of irradiation: Secondary | ICD-10-CM | POA: Insufficient documentation

## 2019-07-08 DIAGNOSIS — R590 Localized enlarged lymph nodes: Secondary | ICD-10-CM | POA: Insufficient documentation

## 2019-07-08 DIAGNOSIS — R1319 Other dysphagia: Secondary | ICD-10-CM

## 2019-07-08 DIAGNOSIS — C3411 Malignant neoplasm of upper lobe, right bronchus or lung: Secondary | ICD-10-CM | POA: Diagnosis not present

## 2019-07-08 DIAGNOSIS — C3491 Malignant neoplasm of unspecified part of right bronchus or lung: Secondary | ICD-10-CM

## 2019-07-08 DIAGNOSIS — K5900 Constipation, unspecified: Secondary | ICD-10-CM | POA: Diagnosis not present

## 2019-07-08 DIAGNOSIS — Z5112 Encounter for antineoplastic immunotherapy: Secondary | ICD-10-CM | POA: Diagnosis not present

## 2019-07-08 DIAGNOSIS — Z85828 Personal history of other malignant neoplasm of skin: Secondary | ICD-10-CM | POA: Insufficient documentation

## 2019-07-08 DIAGNOSIS — R0609 Other forms of dyspnea: Secondary | ICD-10-CM | POA: Insufficient documentation

## 2019-07-08 DIAGNOSIS — M1990 Unspecified osteoarthritis, unspecified site: Secondary | ICD-10-CM | POA: Insufficient documentation

## 2019-07-08 DIAGNOSIS — Z9221 Personal history of antineoplastic chemotherapy: Secondary | ICD-10-CM | POA: Insufficient documentation

## 2019-07-08 DIAGNOSIS — K222 Esophageal obstruction: Secondary | ICD-10-CM | POA: Insufficient documentation

## 2019-07-08 DIAGNOSIS — Z79899 Other long term (current) drug therapy: Secondary | ICD-10-CM | POA: Insufficient documentation

## 2019-07-08 DIAGNOSIS — R1310 Dysphagia, unspecified: Secondary | ICD-10-CM

## 2019-07-08 DIAGNOSIS — K449 Diaphragmatic hernia without obstruction or gangrene: Secondary | ICD-10-CM | POA: Diagnosis not present

## 2019-07-08 LAB — CBC WITH DIFFERENTIAL (CANCER CENTER ONLY)
Abs Immature Granulocytes: 0.02 10*3/uL (ref 0.00–0.07)
Basophils Absolute: 0 10*3/uL (ref 0.0–0.1)
Basophils Relative: 0 %
Eosinophils Absolute: 0.6 10*3/uL — ABNORMAL HIGH (ref 0.0–0.5)
Eosinophils Relative: 10 %
HCT: 46.6 % — ABNORMAL HIGH (ref 36.0–46.0)
Hemoglobin: 14.8 g/dL (ref 12.0–15.0)
Immature Granulocytes: 0 %
Lymphocytes Relative: 7 %
Lymphs Abs: 0.4 10*3/uL — ABNORMAL LOW (ref 0.7–4.0)
MCH: 29.9 pg (ref 26.0–34.0)
MCHC: 31.8 g/dL (ref 30.0–36.0)
MCV: 94.1 fL (ref 80.0–100.0)
Monocytes Absolute: 0.4 10*3/uL (ref 0.1–1.0)
Monocytes Relative: 6 %
Neutro Abs: 5.1 10*3/uL (ref 1.7–7.7)
Neutrophils Relative %: 77 %
Platelet Count: 223 10*3/uL (ref 150–400)
RBC: 4.95 MIL/uL (ref 3.87–5.11)
RDW: 13 % (ref 11.5–15.5)
WBC Count: 6.5 10*3/uL (ref 4.0–10.5)
nRBC: 0 % (ref 0.0–0.2)

## 2019-07-08 LAB — CMP (CANCER CENTER ONLY)
ALT: 29 U/L (ref 0–44)
AST: 28 U/L (ref 15–41)
Albumin: 3.3 g/dL — ABNORMAL LOW (ref 3.5–5.0)
Alkaline Phosphatase: 214 U/L — ABNORMAL HIGH (ref 38–126)
Anion gap: 15 (ref 5–15)
BUN: 16 mg/dL (ref 8–23)
CO2: 21 mmol/L — ABNORMAL LOW (ref 22–32)
Calcium: 9.1 mg/dL (ref 8.9–10.3)
Chloride: 107 mmol/L (ref 98–111)
Creatinine: 0.8 mg/dL (ref 0.44–1.00)
GFR, Est AFR Am: >60 mL/min (ref >60)
GFR, Estimated: >60 mL/min (ref >60)
Glucose, Bld: 195 mg/dL — ABNORMAL HIGH (ref 70–99)
Potassium: 3.8 mmol/L (ref 3.5–5.1)
Sodium: 143 mmol/L (ref 135–145)
Total Bilirubin: 0.9 mg/dL (ref 0.3–1.2)
Total Protein: 6.9 g/dL (ref 6.5–8.1)

## 2019-07-08 LAB — TSH: TSH: 2.74 u[IU]/mL (ref 0.308–3.960)

## 2019-07-08 MED ORDER — SODIUM CHLORIDE 0.9 % IV SOLN
Freq: Once | INTRAVENOUS | Status: DC
  Filled 2019-07-08: qty 250

## 2019-07-08 MED ORDER — SODIUM CHLORIDE 0.9 % IV SOLN
1500.0000 mg | Freq: Once | INTRAVENOUS | Status: AC
  Administered 2019-07-08: 1500 mg via INTRAVENOUS
  Filled 2019-07-08: qty 30

## 2019-07-08 NOTE — Patient Instructions (Signed)
[facility] Discharge Instructions for Patients Receiving Chemotherapy  Today you received the following chemotherapy agents: durvalumab.  To help prevent nausea and vomiting after your treatment, we encourage you to take your nausea medication as directed.   If you develop nausea and vomiting that is not controlled by your nausea medication, call the clinic.   BELOW ARE SYMPTOMS THAT SHOULD BE REPORTED IMMEDIATELY:  *FEVER GREATER THAN 100.5 F  *CHILLS WITH OR WITHOUT FEVER  NAUSEA AND VOMITING THAT IS NOT CONTROLLED WITH YOUR NAUSEA MEDICATION  *UNUSUAL SHORTNESS OF BREATH  *UNUSUAL BRUISING OR BLEEDING  TENDERNESS IN MOUTH AND THROAT WITH OR WITHOUT PRESENCE OF ULCERS  *URINARY PROBLEMS  *BOWEL PROBLEMS  UNUSUAL RASH Items with * indicate a potential emergency and should be followed up as soon as possible.  Feel free to call the clinic should you have any questions or concerns. The clinic phone number is [phone].  Please show the CHEMO ALERT CARD at check-in to the Emergency Department and triage nurse.   

## 2019-07-17 ENCOUNTER — Telehealth: Payer: Self-pay | Admitting: Nurse Practitioner

## 2019-07-17 NOTE — Telephone Encounter (Signed)
Spoke with the patient. She is advised Dr Carlean Purl is including her oncologist in the decision making for an EGD. He is also interested in the results of the CT of her chest. Confirmed for her the CT is on 08/01/19 at 11:00 in [facility]. She is to arrive at 10:45am per the appointment note.

## 2019-07-17 NOTE — Telephone Encounter (Signed)
Pt inquired about results of CT.

## 2019-08-01 ENCOUNTER — Ambulatory Visit (HOSPITAL_COMMUNITY)
Admission: RE | Admit: 2019-08-01 | Discharge: 2019-08-01 | Disposition: A | Discharge status: Home / Self Care | Payer: PPO | Source: Ambulatory Visit | Attending: Physician Assistant | Admitting: Physician Assistant

## 2019-08-01 ENCOUNTER — Other Ambulatory Visit: Payer: Self-pay

## 2019-08-01 DIAGNOSIS — C3491 Malignant neoplasm of unspecified part of right bronchus or lung: Secondary | ICD-10-CM | POA: Insufficient documentation

## 2019-08-01 DIAGNOSIS — C3411 Malignant neoplasm of upper lobe, right bronchus or lung: Secondary | ICD-10-CM | POA: Diagnosis not present

## 2019-08-01 MED ORDER — SODIUM CHLORIDE (PF) 0.9 % IJ SOLN
INTRAMUSCULAR | Status: AC
  Filled 2019-08-01: qty 50

## 2019-08-01 MED ORDER — IOHEXOL 300 MG/ML  SOLN
75.0000 mL | Freq: Once | INTRAMUSCULAR | Status: AC | PRN
  Administered 2019-08-01: 75 mL via INTRAVENOUS

## 2019-08-05 ENCOUNTER — Inpatient Hospital Stay: Payer: PPO

## 2019-08-05 ENCOUNTER — Telehealth: Payer: Self-pay | Admitting: Internal Medicine

## 2019-08-05 ENCOUNTER — Other Ambulatory Visit: Payer: Self-pay

## 2019-08-05 ENCOUNTER — Encounter: Payer: Self-pay | Admitting: Internal Medicine

## 2019-08-05 ENCOUNTER — Telehealth: Payer: Self-pay | Admitting: Medical Oncology

## 2019-08-05 ENCOUNTER — Inpatient Hospital Stay: Payer: PPO | Attending: Internal Medicine | Admitting: Internal Medicine

## 2019-08-05 VITALS — BP 141/76 | HR 96 | Temp 98.6°F | Resp 18 | Ht 65.0 in | Wt 196.3 lb

## 2019-08-05 DIAGNOSIS — Z5112 Encounter for antineoplastic immunotherapy: Secondary | ICD-10-CM

## 2019-08-05 DIAGNOSIS — N281 Cyst of kidney, acquired: Secondary | ICD-10-CM | POA: Insufficient documentation

## 2019-08-05 DIAGNOSIS — R5383 Other fatigue: Secondary | ICD-10-CM | POA: Insufficient documentation

## 2019-08-05 DIAGNOSIS — Z9221 Personal history of antineoplastic chemotherapy: Secondary | ICD-10-CM | POA: Diagnosis not present

## 2019-08-05 DIAGNOSIS — K8020 Calculus of gallbladder without cholecystitis without obstruction: Secondary | ICD-10-CM | POA: Diagnosis not present

## 2019-08-05 DIAGNOSIS — J479 Bronchiectasis, uncomplicated: Secondary | ICD-10-CM | POA: Insufficient documentation

## 2019-08-05 DIAGNOSIS — C787 Secondary malignant neoplasm of liver and intrahepatic bile duct: Secondary | ICD-10-CM | POA: Insufficient documentation

## 2019-08-05 DIAGNOSIS — I10 Essential (primary) hypertension: Secondary | ICD-10-CM | POA: Diagnosis not present

## 2019-08-05 DIAGNOSIS — Z85828 Personal history of other malignant neoplasm of skin: Secondary | ICD-10-CM | POA: Diagnosis not present

## 2019-08-05 DIAGNOSIS — C3491 Malignant neoplasm of unspecified part of right bronchus or lung: Secondary | ICD-10-CM

## 2019-08-05 DIAGNOSIS — Z8719 Personal history of other diseases of the digestive system: Secondary | ICD-10-CM | POA: Insufficient documentation

## 2019-08-05 DIAGNOSIS — R590 Localized enlarged lymph nodes: Secondary | ICD-10-CM | POA: Diagnosis not present

## 2019-08-05 DIAGNOSIS — Z79899 Other long term (current) drug therapy: Secondary | ICD-10-CM | POA: Diagnosis not present

## 2019-08-05 DIAGNOSIS — C7951 Secondary malignant neoplasm of bone: Secondary | ICD-10-CM | POA: Insufficient documentation

## 2019-08-05 DIAGNOSIS — M25512 Pain in left shoulder: Secondary | ICD-10-CM | POA: Insufficient documentation

## 2019-08-05 DIAGNOSIS — I700 Atherosclerosis of aorta: Secondary | ICD-10-CM | POA: Diagnosis not present

## 2019-08-05 DIAGNOSIS — R1310 Dysphagia, unspecified: Secondary | ICD-10-CM | POA: Insufficient documentation

## 2019-08-05 DIAGNOSIS — C3490 Malignant neoplasm of unspecified part of unspecified bronchus or lung: Secondary | ICD-10-CM

## 2019-08-05 DIAGNOSIS — C3411 Malignant neoplasm of upper lobe, right bronchus or lung: Secondary | ICD-10-CM | POA: Diagnosis not present

## 2019-08-05 DIAGNOSIS — R0609 Other forms of dyspnea: Secondary | ICD-10-CM | POA: Diagnosis not present

## 2019-08-05 DIAGNOSIS — C7971 Secondary malignant neoplasm of right adrenal gland: Secondary | ICD-10-CM | POA: Insufficient documentation

## 2019-08-05 DIAGNOSIS — K449 Diaphragmatic hernia without obstruction or gangrene: Secondary | ICD-10-CM | POA: Insufficient documentation

## 2019-08-05 DIAGNOSIS — J90 Pleural effusion, not elsewhere classified: Secondary | ICD-10-CM | POA: Insufficient documentation

## 2019-08-05 DIAGNOSIS — K5730 Diverticulosis of large intestine without perforation or abscess without bleeding: Secondary | ICD-10-CM | POA: Diagnosis not present

## 2019-08-05 DIAGNOSIS — K5900 Constipation, unspecified: Secondary | ICD-10-CM | POA: Diagnosis not present

## 2019-08-05 DIAGNOSIS — R109 Unspecified abdominal pain: Secondary | ICD-10-CM | POA: Diagnosis not present

## 2019-08-05 LAB — CMP (CANCER CENTER ONLY)
ALT: 22 U/L (ref 0–44)
AST: 19 U/L (ref 15–41)
Albumin: 3.3 g/dL — ABNORMAL LOW (ref 3.5–5.0)
Alkaline Phosphatase: 240 U/L — ABNORMAL HIGH (ref 38–126)
Anion gap: 12 (ref 5–15)
BUN: 12 mg/dL (ref 8–23)
CO2: 21 mmol/L — ABNORMAL LOW (ref 22–32)
Calcium: 9.7 mg/dL (ref 8.9–10.3)
Chloride: 106 mmol/L (ref 98–111)
Creatinine: 0.72 mg/dL (ref 0.44–1.00)
GFR, Est AFR Am: >60 mL/min (ref >60)
GFR, Estimated: >60 mL/min (ref >60)
Glucose, Bld: 127 mg/dL — ABNORMAL HIGH (ref 70–99)
Potassium: 3.6 mmol/L (ref 3.5–5.1)
Sodium: 139 mmol/L (ref 135–145)
Total Bilirubin: 0.8 mg/dL (ref 0.3–1.2)
Total Protein: 6.8 g/dL (ref 6.5–8.1)

## 2019-08-05 LAB — CBC WITH DIFFERENTIAL (CANCER CENTER ONLY)
Abs Immature Granulocytes: 0.05 10*3/uL (ref 0.00–0.07)
Basophils Absolute: 0.1 10*3/uL (ref 0.0–0.1)
Basophils Relative: 1 %
Eosinophils Absolute: 1 10*3/uL — ABNORMAL HIGH (ref 0.0–0.5)
Eosinophils Relative: 11 %
HCT: 47.8 % — ABNORMAL HIGH (ref 36.0–46.0)
Hemoglobin: 15.4 g/dL — ABNORMAL HIGH (ref 12.0–15.0)
Immature Granulocytes: 1 %
Lymphocytes Relative: 5 %
Lymphs Abs: 0.4 10*3/uL — ABNORMAL LOW (ref 0.7–4.0)
MCH: 29.1 pg (ref 26.0–34.0)
MCHC: 32.2 g/dL (ref 30.0–36.0)
MCV: 90.2 fL (ref 80.0–100.0)
Monocytes Absolute: 0.6 10*3/uL (ref 0.1–1.0)
Monocytes Relative: 6 %
Neutro Abs: 7.2 10*3/uL (ref 1.7–7.7)
Neutrophils Relative %: 76 %
Platelet Count: 241 10*3/uL (ref 150–400)
RBC: 5.3 MIL/uL — ABNORMAL HIGH (ref 3.87–5.11)
RDW: 13 % (ref 11.5–15.5)
WBC Count: 9.4 10*3/uL (ref 4.0–10.5)
nRBC: 0 % (ref 0.0–0.2)

## 2019-08-05 LAB — TSH: TSH: 6.061 u[IU]/mL — ABNORMAL HIGH (ref 0.308–3.960)

## 2019-08-05 NOTE — Telephone Encounter (Signed)
Hospice ? Dtr called to ask if pt was referred to Hospice . Leona gave her that impression.   I told dtr there is no referral to hospice at this time. Dr Julien Nordmann ordered a PET scan and f/u visit. I encouraged dtr to attend next appt.

## 2019-08-05 NOTE — Progress Notes (Signed)
[facility] [phone]   [phone]  OFFICE PROGRESS NOTE  Prince Solian, MD [place] [address]  DIAGNOSIS: stage IIIc/IV (T4, N2, M0//M1a) lNon-small cell carcinoma, poorly differentiated adenocarcinoma,  presented with large right upper lobe lung mass in addition to mediastinal lymphadenopathy and suspicious left upper lobe nodule diagnosed in December 2020.  Molecular studies by [facility]:  [id number] 0.2% Binimetinib  [id number] 4.8% None (VUS) No (VUS)  [id number] 0.2% None (VUS) No (VUS)  PRIOR THERAPY: 1) Concurrent chemoradiation with weekly carboplatin for AUC of 2 and paclitaxel 45 mg/M2.  First dose of March 04, 2019.  Status post 7 cycles.  Last dose was given on April 15, 2019. 2) Consolidation immunotherapy with Imfinzi 1500 mg IV every 4 weeks.  First dose May 13, 2019.  Status post 3 cycles.  This treatment is currently on hold secondary to suspicious disease progression.  CURRENT THERAPY: None  INTERVAL HISTORY: Ashley Parker 86 y.o. female returns to the clinic today for follow-up visit.  The patient is feeling fine today with no concerning complaints except for left shoulder pain after she grabbed her shoulder during a movement.  She denied having any current chest pain but has baseline shortness of breath increased with exertion with mild cough and no hemoptysis.  She denied having any nausea, vomiting, diarrhea or constipation.  She has no headache or visual changes.  The patient denied having any fever or chills.  She has been tolerating her treatment with immunotherapy fairly well.  She had repeat CT scan of the chest performed recently and she is here for evaluation and discussion of her risk her results.  MEDICAL HISTORY: Past Medical History:  Diagnosis Date   Arthritis    Basal cell carcinoma (BCC) of skin of face 2016   removed in 2016   COPD (chronic obstructive pulmonary  disease) (HCC)    Mild   Crohn's ileitis (HCC)    Mild, mostly asymptomatic, not on therapy   Fundic gland polyposis of stomach    GERD (gastroesophageal reflux disease)    Glaucoma    Hemorrhoids    Internal and External   History of hiatal hernia    Hypertension    Macular degeneration    Obesity, unspecified    Osteoporosis    Pelvic fracture ([place]) 2007   Stricture of esophagus    distal   Unspecified vitamin D deficiency     ALLERGIES:  is allergic to wasp venom; biaxin [clarithromycin]; and brimonidine.  MEDICATIONS:  Current Outpatient Medications  Medication Sig Dispense Refill   albuterol (VENTOLIN HFA) 108 (90 Base) MCG/ACT inhaler Inhale 1 puff into the lungs as needed for wheezing or shortness of breath.     Ascorbic Acid (VITAMIN C) 500 MG CHEW Chew 500 mg by mouth daily.     B Complex-C (SUPER B COMPLEX PO) Take 1 tablet by mouth daily.     Calcium Carb-Cholecalciferol (CALCIUM 600+D3 PO) Take 1-2 tablets by mouth See admin instructions. Take 1 tablet in the morning & 2 tablet at night     calcium elemental as carbonate (BARIATRIC TUMS ULTRA) 400 MG chewable tablet Chew 2,000 mg by mouth at bedtime.     Cholecalciferol (VITAMIN D3) 2000 units TABS Take 2,000 Units by mouth daily.      diclofenac Sodium (VOLTAREN) 1 % GEL Apply 1 application topically 4 (four) times daily as needed (pain.).      dorzolamide (TRUSOPT) 2 %  ophthalmic solution Place 1 drop into the left eye 2 (two) times daily.      EPINEPHrine 0.3 mg/0.3 mL IJ SOAJ injection Inject 0.3 mg into the muscle as needed (for allergic reaction).     Flaxseed, Linseed, (FLAXSEED OIL PO) Take 1 tablet by mouth daily.     fluticasone (FLONASE) 50 MCG/ACT nasal spray Place 1 spray into both nostrils daily.      gabapentin (NEURONTIN) 300 MG capsule Take 300 mg by mouth 2 (two) times daily.     irbesartan (AVAPRO) 300 MG tablet Take 300 mg by mouth daily.      LUMIGAN 0.01 % SOLN Place 1  drop into the right eye at bedtime.   4   Magnesium 250 MG TABS Take 250 mg by mouth daily.     Multiple Vitamins-Minerals (PRESERVISION AREDS 2 PO) Take 1 tablet by mouth 2 (two) times daily.     Omega-3 Fatty Acids (FISH OIL) 1000 MG CAPS Take 1,000 mg by mouth daily.     Polyethyl Glycol-Propyl Glycol (SYSTANE) 0.4-0.3 % SOLN Place 1 drop into both eyes 3 (three) times daily as needed (for tired/dry eyes.).      timolol (TIMOPTIC) 0.5 % ophthalmic solution Place 1 drop into the left eye 2 (two) times daily.     traMADol (ULTRAM) 50 MG tablet Take 50 mg by mouth 2 (two) times daily.     TURMERIC PO Take 1,500 mg by mouth daily.     vitamin B-12 (CYANOCOBALAMIN) 1000 MCG tablet Take 1,000 mcg by mouth daily.     vitamin E 400 UNIT capsule Take 400 Units by mouth daily. (180 mg)     Zinc 30 MG TABS Take 30 mg by mouth daily.     No current facility-administered medications for this visit.    SURGICAL HISTORY:  Past Surgical History:  Procedure Laterality Date   APPENDECTOMY     BACK SURGERY     CATARACT EXTRACTION     COLONOSCOPY  07/27/2001, 07/08/2011   Multiple   ESOPHAGEAL DILATION  2013   ESOPHAGOGASTRODUODENOSCOPY     multiple   LAPAROSCOPIC APPENDECTOMY N/A 02/19/2014   Procedure: APPENDECTOMY LAPAROSCOPIC;  Surgeon: Erroll Luna, MD;  Location: [facility];  Service: General;  Laterality: N/A;   MINOR PLACEMENT OF FIDUCIAL  08/23/2017   Procedure: MINOR PLACEMENT OF FIDUCIAL - Left Upper Lobe Lung x2, Right Upper Lobe Lung x3;  Surgeon: Collene Gobble, MD;  Location: [facility];  Service: Thoracic;;   TONSILLECTOMY AND ADENOIDECTOMY     and adenoids    TOTAL KNEE ARTHROPLASTY Left 2007   VIDEO BRONCHOSCOPY WITH ENDOBRONCHIAL NAVIGATION N/A 08/23/2017   Procedure: VIDEO BRONCHOSCOPY WITH ENDOBRONCHIAL NAVIGATION;  Surgeon: Collene Gobble, MD;  Location: [facility];  Service: Thoracic;  Laterality: N/A;   VIDEO BRONCHOSCOPY WITH ENDOBRONCHIAL NAVIGATION N/A 01/31/2019    Procedure: VIDEO BRONCHOSCOPY WITH ENDOBRONCHIAL NAVIGATION;  Surgeon: Garner Nash, DO;  Location: [facility];  Service: Thoracic;  Laterality: N/A;   VIDEO BRONCHOSCOPY WITH ENDOBRONCHIAL ULTRASOUND N/A 01/31/2019   Procedure: VIDEO BRONCHOSCOPY WITH ENDOBRONCHIAL ULTRASOUND;  Surgeon: Garner Nash, DO;  Location: [facility];  Service: Thoracic;  Laterality: N/A;   WRIST FRACTURE SURGERY Left 2018   with screws    REVIEW OF SYSTEMS:  Constitutional: positive for fatigue Eyes: negative Ears, nose, mouth, throat, and face: negative Respiratory: positive for dyspnea on exertion Cardiovascular: negative Gastrointestinal: positive for abdominal pain Genitourinary:negative Integument/breast: negative Hematologic/lymphatic: negative Musculoskeletal:negative Neurological: negative Behavioral/Psych: negative  Endocrine: negative Allergic/Immunologic: negative   PHYSICAL EXAMINATION: General appearance: alert, cooperative, fatigued and no distress Head: Normocephalic, without obvious abnormality, atraumatic Neck: no adenopathy, no JVD, supple, symmetrical, trachea midline and thyroid not enlarged, symmetric, no tenderness/mass/nodules Lymph nodes: Cervical, supraclavicular, and axillary nodes normal. Resp: clear to auscultation bilaterally Back: symmetric, no curvature. ROM normal. No CVA tenderness. Cardio: regular rate and rhythm, S1, S2 normal, no murmur, click, rub or gallop GI: soft, non-tender; bowel sounds normal; no masses,  no organomegaly Extremities: extremities normal, atraumatic, no cyanosis or edema Neurologic: Alert and oriented X 3, normal strength and tone. Normal symmetric reflexes. Normal coordination and gait  ECOG PERFORMANCE STATUS: 1 - Symptomatic but completely ambulatory  Blood pressure (!) 141/76, pulse 96, temperature 98.6 F (37 C), temperature source Temporal, resp. rate 18, height 5' 5"  (1.651 m), weight 196 lb 4.8 oz (89 kg), SpO2 97 %.  LABORATORY  DATA: Lab Results  Component Value Date   WBC 9.4 08/05/2019   HGB 15.4 (H) 08/05/2019   HCT 47.8 (H) 08/05/2019   MCV 90.2 08/05/2019   PLT 241 08/05/2019      Chemistry      Component Value Date/Time   NA 143 07/08/2019 0847   K 3.8 07/08/2019 0847   CL 107 07/08/2019 0847   CO2 21 (L) 07/08/2019 0847   BUN 16 07/08/2019 0847   CREATININE 0.80 07/08/2019 0847      Component Value Date/Time   CALCIUM 9.1 07/08/2019 0847   ALKPHOS 214 (H) 07/08/2019 0847   AST 28 07/08/2019 0847   ALT 29 07/08/2019 0847   BILITOT 0.9 07/08/2019 0847       RADIOGRAPHIC STUDIES: CT Chest W Contrast  Result Date: 08/01/2019 CLINICAL DATA:  Primary Cancer Type: Lung Imaging Indication: Routine surveillance Interval therapy since last imaging? Yes Initial Cancer Diagnosis Date: 02/14/2019; Established by: Biopsy-proven Detailed Pathology: Stage IIIc/IV non-small cell carcinoma, poorly differentiated adenocarcinoma. Primary Tumor location: Right upper lobe Chemotherapy: Yes; Ongoing? No; Most recent administration: 04/15/2019 Immunotherapy?  Yes; Type: Imfinzi; Ongoing? Yes Radiation therapy? Yes; Date Range: 03/06/2019-04/19/2019; Target: Right lung EXAM: CT CHEST WITH CONTRAST TECHNIQUE: Multidetector CT imaging of the chest was performed during intravenous contrast administration. CONTRAST:  73m OMNIPAQUE IOHEXOL 300 MG/ML  SOLN COMPARISON:  Most recent CT chest 05/03/2019.  08/02/2017 PET-CT. FINDINGS: Cardiovascular: Normal heart size. No significant pericardial effusion/thickening. Three-vessel coronary atherosclerosis. Atherosclerotic nonaneurysmal thoracic aorta. Normal caliber pulmonary arteries. No central pulmonary emboli. Mediastinum/Nodes: Heterogeneous thyroid gland is unchanged. Unremarkable esophagus. New rounded 0.8 cm right axillary node (series 2/image 49). No pathologically enlarged axillary nodes. Mildly enlarged 1.1 cm right paratracheal node (series 2/image 37), decreased from 2.0  cm. New pathologically enlarged mediastinal nodes. No hilar adenopathy. Lungs/Pleura: No pneumothorax. Moderate dependent right pleural effusion is unchanged. Previously described 1.1 cm posteroinferior right pleural nodule is decreased to 0.4 cm (series 2/image 133). No left pleural effusion. Sharply marginated right perihilar lung consolidation with associated increased volume loss, distortion and bronchiectasis, compatible with evolving postradiation change. Previously visualized 8.1 x 5.4 cm right upper lobe lung mass has substantially decreased with a residual 1.4 x 1.1 cm posterior right upper lobe pulmonary nodule (series 5/image 41). Previously visualized subsolid 2.0 x 1.6 cm posterior left upper lobe pulmonary nodule appears mildly decreased to 1.6 x 1.4 cm (series 5/image 28), with new surrounding sharply marginated bandlike consolidation, volume loss and distortion compatible with early postradiation change. No new significant pulmonary nodules. Upper abdomen: Small to moderate hiatal hernia. Numerous  new irregular solid implants throughout the visualized anterior upper peritoneal cavity bilaterally with representative 7.7 x 5.0 cm anterior right peritoneal implant with mass-effect on the inferior anterior right liver capsule (series 2/image 176) and with representative 2.7 cm implant in left extrahepatic space (series 2/image 139). Stable 1.2 cm left adrenal nodule, previously characterized as an adenoma. Stable enlarged 1.0 cm right gastrohepatic ligament node (series 2/image 118). Musculoskeletal: No aggressive appearing focal osseous lesions. Moderate thoracic spondylosis. Chronic mild T12 vertebral compression fracture is unchanged. IMPRESSION: 1. Numerous new irregular large solid peritoneal implants throughout the visualized anterior upper abdomen bilaterally, compatible with new widespread peritoneal carcinomatosis. 2. Right upper lobe lung mass and left upper lobe pulmonary nodule are decreased,  each with associated evolving surrounding post radiation changes. 3. Stable moderate dependent right pleural effusion. Dependent basilar right pleural nodule is decreased. 4. Mediastinal lymphadenopathy is decreased. 5. New rounded mildly prominent right axillary lymph node, metastasis not excluded. 6.  Aortic Atherosclerosis (ICD10-I70.0). Electronically Signed   By: Ilona Sorrel [name]   On: 08/01/2019 11:44    ASSESSMENT AND PLAN: This is a very pleasant 86 years old white female with a stage IIIc/IV non-small cell lung cancer, poorly differentiated adenocarcinoma presented with right upper lobe lung mass in addition to mediastinal lymphadenopathy diagnosed in December 2020. Molecular studies by [facility] showed no actionable mutations. She underwent a course of concurrent chemoradiation with weekly carboplatin and paclitaxel status post 7 cycles.  She tolerated her treatment well except for mild odynophagia and dysphagia.  Her condition improved but she has persistent mild dysphagia. The patient started consolidation treatment with immunotherapy with Imfinzi 1500 mg IV every 4 weeks is status post 3 cycles.  She has been tolerating this treatment fairly well. Repeat CT scan of the chest performed recently showed concerning findings for disease progression in the abdomen with peritoneal implants concerning for widespread peritoneal carcinomatosis. I recommended for the patient to have a PET scan for further evaluation of her disease and confirmation of the disease progression before switching her treatment. I will hold her treatment for now until the PET scan results. She will come back for follow-up visit in 2 weeks for evaluation and discussion of her PET scan results and treatment options. She was advised to call immediately if she has any concerning symptoms in the interval. The patient voices understanding of current disease status and treatment options and is in agreement with the current care  plan. All questions were answered. The patient knows to call the clinic with any problems, questions or concerns. We can certainly see the patient much sooner if necessary.     Disclaimer: This note was dictated with voice recognition software. Similar sounding words can inadvertently be transcribed and may not be corrected upon review.

## 2019-08-05 NOTE — Progress Notes (Signed)
Dysphagia- unable to swallow pills except Gabapentin.  Awaiting to see if Dr Carlean Purl is going to "stretch my esophagus".

## 2019-08-05 NOTE — Telephone Encounter (Signed)
Scheduled appt per 6/7 los - gave patient AVS and calender.

## 2019-08-05 NOTE — Progress Notes (Signed)
Called her Dysphagia is improved - eating all consistencies of food now though chewing well.   We have decided to watch and see - if dysphagia is worsening could set up EGD (at hospital)  So no EGD now  ? If she had radiation inflammation that is improved?

## 2019-08-06 NOTE — Telephone Encounter (Signed)
No Not yet. Will see what the PET scan shows.

## 2019-08-08 DIAGNOSIS — S42035A Nondisplaced fracture of lateral end of left clavicle, initial encounter for closed fracture: Secondary | ICD-10-CM | POA: Diagnosis not present

## 2019-08-14 ENCOUNTER — Encounter (HOSPITAL_COMMUNITY)
Admission: RE | Admit: 2019-08-14 | Discharge: 2019-08-14 | Disposition: A | Discharge status: Home / Self Care | Payer: PPO | Source: Ambulatory Visit | Attending: Internal Medicine | Admitting: Internal Medicine

## 2019-08-14 ENCOUNTER — Other Ambulatory Visit: Payer: Self-pay

## 2019-08-14 DIAGNOSIS — C7801 Secondary malignant neoplasm of right lung: Secondary | ICD-10-CM | POA: Diagnosis not present

## 2019-08-14 DIAGNOSIS — C7802 Secondary malignant neoplasm of left lung: Secondary | ICD-10-CM | POA: Diagnosis not present

## 2019-08-14 DIAGNOSIS — I700 Atherosclerosis of aorta: Secondary | ICD-10-CM | POA: Diagnosis not present

## 2019-08-14 DIAGNOSIS — I2510 Atherosclerotic heart disease of native coronary artery without angina pectoris: Secondary | ICD-10-CM | POA: Insufficient documentation

## 2019-08-14 DIAGNOSIS — C7971 Secondary malignant neoplasm of right adrenal gland: Secondary | ICD-10-CM | POA: Diagnosis not present

## 2019-08-14 DIAGNOSIS — C3490 Malignant neoplasm of unspecified part of unspecified bronchus or lung: Secondary | ICD-10-CM | POA: Diagnosis not present

## 2019-08-14 DIAGNOSIS — C7951 Secondary malignant neoplasm of bone: Secondary | ICD-10-CM | POA: Diagnosis not present

## 2019-08-14 DIAGNOSIS — C7889 Secondary malignant neoplasm of other digestive organs: Secondary | ICD-10-CM | POA: Diagnosis not present

## 2019-08-14 MED ORDER — FLUDEOXYGLUCOSE F - 18 (FDG) INJECTION
10.2000 | Freq: Once | INTRAVENOUS | Status: AC
  Administered 2019-08-14: 10.2 via INTRAVENOUS

## 2019-08-15 LAB — GLUCOSE, CAPILLARY: Glucose-Capillary: 108 mg/dL — ABNORMAL HIGH (ref 70–99)

## 2019-08-19 ENCOUNTER — Inpatient Hospital Stay (HOSPITAL_BASED_OUTPATIENT_CLINIC_OR_DEPARTMENT_OTHER): Payer: PPO | Admitting: Internal Medicine

## 2019-08-19 ENCOUNTER — Encounter: Payer: Self-pay | Admitting: Internal Medicine

## 2019-08-19 ENCOUNTER — Other Ambulatory Visit: Payer: Self-pay

## 2019-08-19 ENCOUNTER — Encounter: Payer: Self-pay | Admitting: *Deleted

## 2019-08-19 ENCOUNTER — Telehealth: Payer: Self-pay | Admitting: Medical Oncology

## 2019-08-19 VITALS — BP 130/56 | HR 110 | Temp 97.7°F | Resp 18 | Ht 65.0 in | Wt 189.7 lb

## 2019-08-19 DIAGNOSIS — C3491 Malignant neoplasm of unspecified part of right bronchus or lung: Secondary | ICD-10-CM | POA: Diagnosis not present

## 2019-08-19 DIAGNOSIS — Z5111 Encounter for antineoplastic chemotherapy: Secondary | ICD-10-CM | POA: Diagnosis not present

## 2019-08-19 DIAGNOSIS — C3411 Malignant neoplasm of upper lobe, right bronchus or lung: Secondary | ICD-10-CM | POA: Diagnosis not present

## 2019-08-19 NOTE — Telephone Encounter (Signed)
Referred to [facility]. Dr Julien Nordmann will be attending.

## 2019-08-19 NOTE — Progress Notes (Signed)
Pt is still unable to swallow pills -they get stuck.

## 2019-08-19 NOTE — Progress Notes (Signed)
[place] [phone]   [phone]  OFFICE PROGRESS NOTE  Prince Solian, MD [place] [address]  DIAGNOSIS: stage IIIc/IV (T4, N2, M0//M1a) lNon-small cell carcinoma, poorly differentiated adenocarcinoma,  presented with large right upper lobe lung mass in addition to mediastinal lymphadenopathy and suspicious left upper lobe nodule diagnosed in December 2020.  Molecular studies by [facility]:  [id number] 0.2% Binimetinib  [id number] 4.8% None (VUS) No (VUS)  [id number] 0.2% None (VUS) No (VUS)  PRIOR THERAPY: 1) Concurrent chemoradiation with weekly carboplatin for AUC of 2 and paclitaxel 45 mg/M2.  First dose of March 04, 2019.  Status post 7 cycles.  Last dose was given on April 15, 2019. 2) Consolidation immunotherapy with Imfinzi 1500 mg IV every 4 weeks.  First dose May 13, 2019.  Status post 3 cycles.  This treatment is currently on hold secondary to suspicious disease progression.  CURRENT THERAPY: Palliative and hospice care.  INTERVAL HISTORY: Ashley Parker 86 y.o. female returns to the clinic today for follow-up visit accompanied by her daughter Anderson Malta.  The patient is feeling fine today with no concerning complaints.  She denied having any current chest pain, shortness of breath, cough or hemoptysis.  She denied having any fever or chills.  She has no nausea, vomiting or diarrhea but has intermittent constipation.  She denied having any night sweats but has weight loss.  She denied having any headache or visual changes.  The patient was found on previous CT scan of the chest to have evidence for disease progression.  I ordered a PET scan which was performed recently and she is here for evaluation and discussion of her treatment options.  MEDICAL HISTORY: Past Medical History:  Diagnosis Date   Arthritis    Basal cell carcinoma (BCC) of skin of face 2016   removed in 2016   COPD (chronic  obstructive pulmonary disease) (HCC)    Mild   Crohn's ileitis (HCC)    Mild, mostly asymptomatic, not on therapy   Fundic gland polyposis of stomach    GERD (gastroesophageal reflux disease)    Glaucoma    Hemorrhoids    Internal and External   History of hiatal hernia    Hypertension    Macular degeneration    Obesity, unspecified    Osteoporosis    Pelvic fracture ([place]) 2007   Stricture of esophagus    distal   Unspecified vitamin D deficiency     ALLERGIES:  is allergic to wasp venom, biaxin [clarithromycin], and brimonidine.  MEDICATIONS:  Current Outpatient Medications  Medication Sig Dispense Refill   albuterol (VENTOLIN HFA) 108 (90 Base) MCG/ACT inhaler Inhale 1 puff into the lungs as needed for wheezing or shortness of breath.     Ascorbic Acid (VITAMIN C) 500 MG CHEW Chew 500 mg by mouth daily.     B Complex-C (SUPER B COMPLEX PO) Take 1 tablet by mouth daily.     Calcium Carb-Cholecalciferol (CALCIUM 600+D3 PO) Take 1-2 tablets by mouth See admin instructions. Take 1 tablet in the morning & 2 tablet at night     calcium elemental as carbonate (BARIATRIC TUMS ULTRA) 400 MG chewable tablet Chew 2,000 mg by mouth at bedtime.     Cholecalciferol (VITAMIN D3) 2000 units TABS Take 2,000 Units by mouth daily.      diclofenac Sodium (VOLTAREN) 1 % GEL Apply 1 application topically 4 (four) times daily as needed (pain.).  dorzolamide (TRUSOPT) 2 % ophthalmic solution Place 1 drop into the left eye 2 (two) times daily.      EPINEPHrine 0.3 mg/0.3 mL IJ SOAJ injection Inject 0.3 mg into the muscle as needed (for allergic reaction).     Flaxseed, Linseed, (FLAXSEED OIL PO) Take 1 tablet by mouth daily.     fluticasone (FLONASE) 50 MCG/ACT nasal spray Place 1 spray into both nostrils daily.      gabapentin (NEURONTIN) 300 MG capsule Take 300 mg by mouth 2 (two) times daily.     irbesartan (AVAPRO) 300 MG tablet Take 300 mg by mouth daily.       LUMIGAN 0.01 % SOLN Place 1 drop into the right eye at bedtime.   4   Magnesium 250 MG TABS Take 250 mg by mouth daily.     Multiple Vitamins-Minerals (PRESERVISION AREDS 2 PO) Take 1 tablet by mouth 2 (two) times daily.     Omega-3 Fatty Acids (FISH OIL) 1000 MG CAPS Take 1,000 mg by mouth daily.     Polyethyl Glycol-Propyl Glycol (SYSTANE) 0.4-0.3 % SOLN Place 1 drop into both eyes 3 (three) times daily as needed (for tired/dry eyes.).      timolol (TIMOPTIC) 0.5 % ophthalmic solution Place 1 drop into the left eye 2 (two) times daily.     traMADol (ULTRAM) 50 MG tablet Take 50 mg by mouth 2 (two) times daily.     TURMERIC PO Take 1,500 mg by mouth daily.     vitamin B-12 (CYANOCOBALAMIN) 1000 MCG tablet Take 1,000 mcg by mouth daily.     vitamin E 400 UNIT capsule Take 400 Units by mouth daily. (180 mg)     Zinc 30 MG TABS Take 30 mg by mouth daily.     No current facility-administered medications for this visit.    SURGICAL HISTORY:  Past Surgical History:  Procedure Laterality Date   APPENDECTOMY     BACK SURGERY     CATARACT EXTRACTION     COLONOSCOPY  07/27/2001, 07/08/2011   Multiple   ESOPHAGEAL DILATION  2013   ESOPHAGOGASTRODUODENOSCOPY     multiple   LAPAROSCOPIC APPENDECTOMY N/A 02/19/2014   Procedure: APPENDECTOMY LAPAROSCOPIC;  Surgeon: Erroll Luna, MD;  Location: [facility];  Service: General;  Laterality: N/A;   MINOR PLACEMENT OF FIDUCIAL  08/23/2017   Procedure: MINOR PLACEMENT OF FIDUCIAL - Left Upper Lobe Lung x2, Right Upper Lobe Lung x3;  Surgeon: Collene Gobble, MD;  Location: [facility];  Service: Thoracic;;   TONSILLECTOMY AND ADENOIDECTOMY     and adenoids    TOTAL KNEE ARTHROPLASTY Left 2007   VIDEO BRONCHOSCOPY WITH ENDOBRONCHIAL NAVIGATION N/A 08/23/2017   Procedure: VIDEO BRONCHOSCOPY WITH ENDOBRONCHIAL NAVIGATION;  Surgeon: Collene Gobble, MD;  Location: [facility];  Service: Thoracic;  Laterality: N/A;   VIDEO BRONCHOSCOPY WITH ENDOBRONCHIAL  NAVIGATION N/A 01/31/2019   Procedure: VIDEO BRONCHOSCOPY WITH ENDOBRONCHIAL NAVIGATION;  Surgeon: Garner Nash, DO;  Location: [facility];  Service: Thoracic;  Laterality: N/A;   VIDEO BRONCHOSCOPY WITH ENDOBRONCHIAL ULTRASOUND N/A 01/31/2019   Procedure: VIDEO BRONCHOSCOPY WITH ENDOBRONCHIAL ULTRASOUND;  Surgeon: Garner Nash, DO;  Location: [facility];  Service: Thoracic;  Laterality: N/A;   WRIST FRACTURE SURGERY Left 2018   with screws    REVIEW OF SYSTEMS:  Constitutional: positive for fatigue and weight loss Eyes: negative Ears, nose, mouth, throat, and face: negative Respiratory: negative Cardiovascular: negative Gastrointestinal: positive for constipation Genitourinary:negative Integument/breast: negative Hematologic/lymphatic: negative Musculoskeletal:negative Neurological: negative  Behavioral/Psych: negative Endocrine: negative Allergic/Immunologic: negative   PHYSICAL EXAMINATION: General appearance: alert, cooperative, fatigued and no distress Head: Normocephalic, without obvious abnormality, atraumatic Neck: no adenopathy, no JVD, supple, symmetrical, trachea midline and thyroid not enlarged, symmetric, no tenderness/mass/nodules Lymph nodes: Cervical, supraclavicular, and axillary nodes normal. Resp: clear to auscultation bilaterally Back: symmetric, no curvature. ROM normal. No CVA tenderness. Cardio: regular rate and rhythm, S1, S2 normal, no murmur, click, rub or gallop GI: soft, non-tender; bowel sounds normal; no masses,  no organomegaly Extremities: extremities normal, atraumatic, no cyanosis or edema Neurologic: Alert and oriented X 3, normal strength and tone. Normal symmetric reflexes. Normal coordination and gait  ECOG PERFORMANCE STATUS: 1 - Symptomatic but completely ambulatory  Blood pressure (!) 130/56, pulse (!) 110, temperature 97.7 F (36.5 C), temperature source Temporal, resp. rate 18, height 5' 5"  (1.651 m), weight 189 lb 11.2 oz (86 kg), SpO2 96  %.  LABORATORY DATA: Lab Results  Component Value Date   WBC 9.4 08/05/2019   HGB 15.4 (H) 08/05/2019   HCT 47.8 (H) 08/05/2019   MCV 90.2 08/05/2019   PLT 241 08/05/2019      Chemistry      Component Value Date/Time   NA 139 08/05/2019 0831   K 3.6 08/05/2019 0831   CL 106 08/05/2019 0831   CO2 21 (L) 08/05/2019 0831   BUN 12 08/05/2019 0831   CREATININE 0.72 08/05/2019 0831      Component Value Date/Time   CALCIUM 9.7 08/05/2019 0831   ALKPHOS 240 (H) 08/05/2019 0831   AST 19 08/05/2019 0831   ALT 22 08/05/2019 0831   BILITOT 0.8 08/05/2019 0831       RADIOGRAPHIC STUDIES: CT Chest W Contrast  Result Date: 08/01/2019 CLINICAL DATA:  Primary Cancer Type: Lung Imaging Indication: Routine surveillance Interval therapy since last imaging? Yes Initial Cancer Diagnosis Date: 02/14/2019; Established by: Biopsy-proven Detailed Pathology: Stage IIIc/IV non-small cell carcinoma, poorly differentiated adenocarcinoma. Primary Tumor location: Right upper lobe Chemotherapy: Yes; Ongoing? No; Most recent administration: 04/15/2019 Immunotherapy?  Yes; Type: Imfinzi; Ongoing? Yes Radiation therapy? Yes; Date Range: 03/06/2019-04/19/2019; Target: Right lung EXAM: CT CHEST WITH CONTRAST TECHNIQUE: Multidetector CT imaging of the chest was performed during intravenous contrast administration. CONTRAST:  14m OMNIPAQUE IOHEXOL 300 MG/ML  SOLN COMPARISON:  Most recent CT chest 05/03/2019.  08/02/2017 PET-CT. FINDINGS: Cardiovascular: Normal heart size. No significant pericardial effusion/thickening. Three-vessel coronary atherosclerosis. Atherosclerotic nonaneurysmal thoracic aorta. Normal caliber pulmonary arteries. No central pulmonary emboli. Mediastinum/Nodes: Heterogeneous thyroid gland is unchanged. Unremarkable esophagus. New rounded 0.8 cm right axillary node (series 2/image 49). No pathologically enlarged axillary nodes. Mildly enlarged 1.1 cm right paratracheal node (series 2/image 37),  decreased from 2.0 cm. New pathologically enlarged mediastinal nodes. No hilar adenopathy. Lungs/Pleura: No pneumothorax. Moderate dependent right pleural effusion is unchanged. Previously described 1.1 cm posteroinferior right pleural nodule is decreased to 0.4 cm (series 2/image 133). No left pleural effusion. Sharply marginated right perihilar lung consolidation with associated increased volume loss, distortion and bronchiectasis, compatible with evolving postradiation change. Previously visualized 8.1 x 5.4 cm right upper lobe lung mass has substantially decreased with a residual 1.4 x 1.1 cm posterior right upper lobe pulmonary nodule (series 5/image 41). Previously visualized subsolid 2.0 x 1.6 cm posterior left upper lobe pulmonary nodule appears mildly decreased to 1.6 x 1.4 cm (series 5/image 28), with new surrounding sharply marginated bandlike consolidation, volume loss and distortion compatible with early postradiation change. No new significant pulmonary nodules. Upper abdomen: Small to moderate  hiatal hernia. Numerous new irregular solid implants throughout the visualized anterior upper peritoneal cavity bilaterally with representative 7.7 x 5.0 cm anterior right peritoneal implant with mass-effect on the inferior anterior right liver capsule (series 2/image 176) and with representative 2.7 cm implant in left extrahepatic space (series 2/image 139). Stable 1.2 cm left adrenal nodule, previously characterized as an adenoma. Stable enlarged 1.0 cm right gastrohepatic ligament node (series 2/image 118). Musculoskeletal: No aggressive appearing focal osseous lesions. Moderate thoracic spondylosis. Chronic mild T12 vertebral compression fracture is unchanged. IMPRESSION: 1. Numerous new irregular large solid peritoneal implants throughout the visualized anterior upper abdomen bilaterally, compatible with new widespread peritoneal carcinomatosis. 2. Right upper lobe lung mass and left upper lobe pulmonary  nodule are decreased, each with associated evolving surrounding post radiation changes. 3. Stable moderate dependent right pleural effusion. Dependent basilar right pleural nodule is decreased. 4. Mediastinal lymphadenopathy is decreased. 5. New rounded mildly prominent right axillary lymph node, metastasis not excluded. 6.  Aortic Atherosclerosis (ICD10-I70.0). Electronically Signed   By: Ilona Sorrel [name]   On: 08/01/2019 11:44   NM PET Image Restag (PS) Skull Base To Thigh  Result Date: 08/14/2019 CLINICAL DATA:  Subsequent treatment strategy for non-small cell lung cancer. Status post chemotherapy/radiation (completed February 2021) and immunotherapy (completed May 2021). EXAM: NUCLEAR MEDICINE PET SKULL BASE TO THIGH TECHNIQUE: 10.2 mCi F-18 FDG was injected intravenously. Full-ring PET imaging was performed from the skull base to thigh after the radiotracer. CT data was obtained and used for attenuation correction and anatomic localization. Fasting blood glucose: 110 mg/dl COMPARISON:  CT chest dated 08/01/2019 FINDINGS: Mediastinal blood pool activity: SUV max 2.9 Liver activity: SUV max NA NECK: No hypermetabolic cervical lymphadenopathy. Incidental CT findings: none CHEST: Radiation changes in the medial right upper lobe/perihilar region, max SUV 4.7. Mild nodularity in the posterior right upper lobe (series 8/image 15), max SUV 4.1. Mild nodularity in the medial left upper lobe (series 8/image 9), max SUV 3.6. Moderate right pleural effusion. No hypermetabolic thoracic lymphadenopathy. Incidental CT findings: Atherosclerotic calcifications of the aortic arch. 3 vessel coronary atherosclerosis. Moderate hiatal hernia. ABDOMEN/PELVIS: Widespread peritoneal disease throughout the abdomen/pelvis, including: --6.4 x 7.3 cm implant overlying the right liver (series 4/image 110), max SUV 18.9 --2.2 x 3.1 cm implant beneath the left mid anterior abdominal wall (series 4/image 102), max SUV 16.2 --2.7 cm  implant within a moderate fat containing periumbilical hernia (series 4/image 143), max SUV 15.5 --2.3 cm implant in the dependent pelvis (series 4/image 141), max SUV 13.5 Focal hypermetabolism along the anterior right liver (PET image 92), without definite parenchymal lesion on prior enhanced CT chest but possibly with overlying pericapsular soft tissue, favoring a peritoneal implant over a hepatic metastasis. 15 mm soft tissue lesion in the pancreatic tail (series 4/image 87), max SUV 5.7. Mild hypermetabolism involving the right adrenal gland, max SUV 8.1. No hypermetabolic abdominopelvic lymphadenopathy. Incidental CT findings: Cholelithiasis. Large right upper pole renal cyst. Atherosclerotic calcifications the abdominal aorta and branch vessels. Sigmoid diverticulosis, without evidence of diverticulitis. SKELETON: Multifocal osseous metastases, including: --Left lateral clavicle, max SUV 13.7 --Right T10 vertebral body, max SUV 10.7 --Posterior elements at L1-2, max SUV 17.6 --L4 vertebral body, max SUV 12.4 --Left iliac bone/acetabulum, max SUV 9.7 Multifocal soft tissue/intramuscular metastases, including: --Right lateral chest wall, max SUV 8.2 --Left lateral abdominal wall, max SUV 15.4 --Right perineal region, max SUV 20.0 --Left posterior thigh, max SUV 19.8 --Right posterior thigh, max SUV 17.1 Incidental CT findings: none IMPRESSION: Radiation  changes in the medial right upper lobe/perihilar region. Bilateral pulmonary nodules/metastases, as above. Moderate right pleural effusion. Widespread peritoneal disease in the abdomen/pelvis. Possible right hepatic metastasis versus overlying peritoneal implant. Associated pancreatic and right adrenal metastases. Multifocal osseous, soft tissue, and intramuscular metastases. Electronically Signed   By: Julian Hy [name]   On: 08/14/2019 16:14    ASSESSMENT AND PLAN: This is a very pleasant 86 years old white female with a stage IIIc/IV non-small cell  lung cancer, poorly differentiated adenocarcinoma presented with right upper lobe lung mass in addition to mediastinal lymphadenopathy diagnosed in December 2020. Molecular studies by [facility] [employer] showed no actionable mutations. She underwent a course of concurrent chemoradiation with weekly carboplatin and paclitaxel status post 7 cycles.  She tolerated her treatment well except for mild odynophagia and dysphagia.  Her condition improved but she has persistent mild dysphagia. The patient started consolidation treatment with immunotherapy with Imfinzi 1500 mg IV every 4 weeks is status post 3 cycles.  She has been tolerating this treatment fairly well. Repeat CT scan of the chest performed recently showed concerning findings for disease progression in the abdomen with peritoneal implants concerning for widespread peritoneal carcinomatosis. The patient had a PET scan performed recently that showed findings of disease progression with bilateral pulmonary nodules and metastases as well as right pleural effusion and widespread peritoneal disease in the abdomen and pelvis as well as liver and multiple osseous metastatic disease in addition to pancreatic and right adrenal metastasis. I personally and independently reviewed the scan images and discussed the result and showed the images to the patient and her daughter. I discussed with the patient several options for treatment of her condition including palliative care and hospice referral versus consideration of biopsy of one of the metastatic lesion to confirm the same pathology before considering her for systemic chemotherapy.  The patient and her daughter are interested and the palliative and hospice care at this point. I agreed with her decision and we will call the palliative care and hospice service of [facility]. I will see the patient on as-needed basis at this point. She was advised to call immediately if she has any concerning symptoms in the  interval. The patient voices understanding of current disease status and treatment options and is in agreement with the current care plan. All questions were answered. The patient knows to call the clinic with any problems, questions or concerns. We can certainly see the patient much sooner if necessary.     Disclaimer: This note was dictated with voice recognition software. Similar sounding words can inadvertently be transcribed and may not be corrected upon review.

## 2019-08-19 NOTE — Progress Notes (Signed)
Ms. Kreft was seen by Dr. Julien Nordmann today and his recommendations are hospice care.  Offered support to her and her daughter. Referral to hospice completed.

## 2019-08-31 DIAGNOSIS — R404 Transient alteration of awareness: Secondary | ICD-10-CM | POA: Diagnosis not present

## 2019-08-31 DIAGNOSIS — I469 Cardiac arrest, cause unspecified: Secondary | ICD-10-CM | POA: Diagnosis not present

## 2019-08-31 DIAGNOSIS — Z743 Need for continuous supervision: Secondary | ICD-10-CM | POA: Diagnosis not present

## 2019-08-31 DIAGNOSIS — R52 Pain, unspecified: Secondary | ICD-10-CM | POA: Diagnosis not present

## 2019-09-03 ENCOUNTER — Other Ambulatory Visit: Payer: PPO

## 2019-09-03 ENCOUNTER — Telehealth: Payer: Self-pay | Admitting: Medical Oncology

## 2019-09-03 ENCOUNTER — Ambulatory Visit: Payer: PPO

## 2019-09-03 ENCOUNTER — Ambulatory Visit: Payer: PPO | Admitting: Physician Assistant

## 2019-09-03 NOTE — Telephone Encounter (Signed)
Pt died

## 2019-09-29 DIAGNOSIS — 419620001 Death: Secondary | SNOMED CT | POA: Diagnosis not present

## 2019-09-29 DEATH — deceased

## 2020-10-04 IMAGING — CR DG CHEST 1V
1 series · 1 of 1 positions shown · non-contrast
Comparison: Chest x-ray dated February 01, 2019.

CLINICAL DATA: Status post thoracentesis.

EXAM:
CHEST  1 VIEW

[w chest pa]
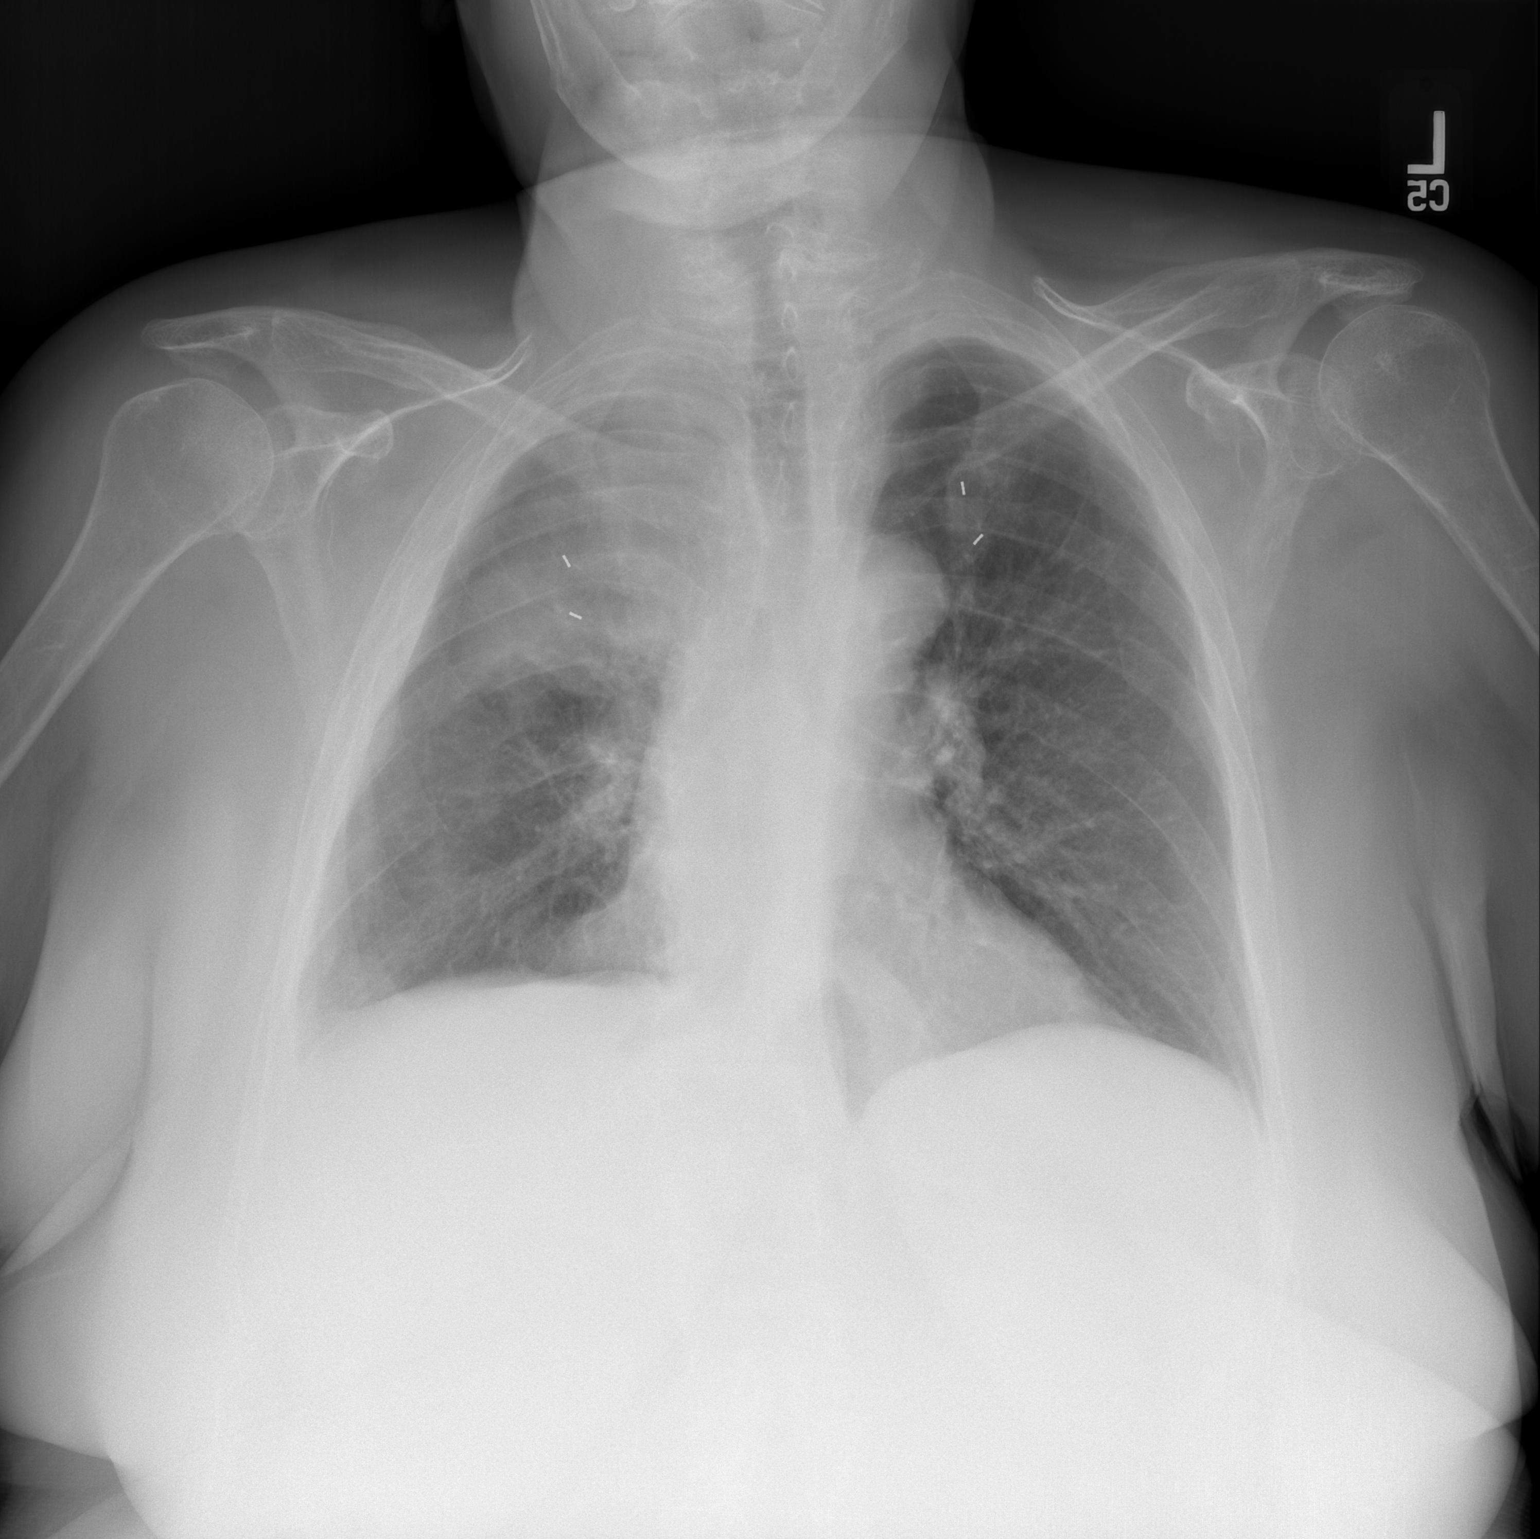

[1 of 1 positions shown; findings below may reference images not displayed]

FINDINGS: The heart size and mediastinal contours are within normal limits.
Normal pulmonary vascularity. Trace right pleural effusion. No
pneumothorax. Unchanged right upper lobe mass/consolidation with
fiducial markers in place. Unchanged small nodule in the left upper
lobe with fiducial markers in place. No acute osseous abnormality.
IMPRESSION: 1. Trace right pleural effusion.  No pneumothorax.
2. Unchanged right upper lobe mass/consolidation and left upper lobe
nodule.

## 2021-02-19 IMAGING — PT NM PET TUM IMG RESTAG (PS) SKULL BASE T - THIGH
1 series · 15 of 22 positions shown · non-contrast
Comparison: CT chest dated 08/01/2019

CLINICAL DATA: Subsequent treatment strategy for non-small cell
lung cancer. Status post chemotherapy/radiation (completed April 2019) and immunotherapy (completed June 2019).

EXAM:
NUCLEAR MEDICINE PET SKULL BASE TO THIGH
TECHNIQUE: 10.2 mCi F-18 FDG was injected intravenously. Full-ring PET imaging
was performed from the skull base to thigh after the radiotracer. CT
data was obtained and used for attenuation correction and anatomic
localization.
Fasting blood glucose: 110 mg/dl

[Series 1148: results mm oncology reading · 1.3mm · 1.30mm/px · 15 of 22 slices shown]
[im 1/22]
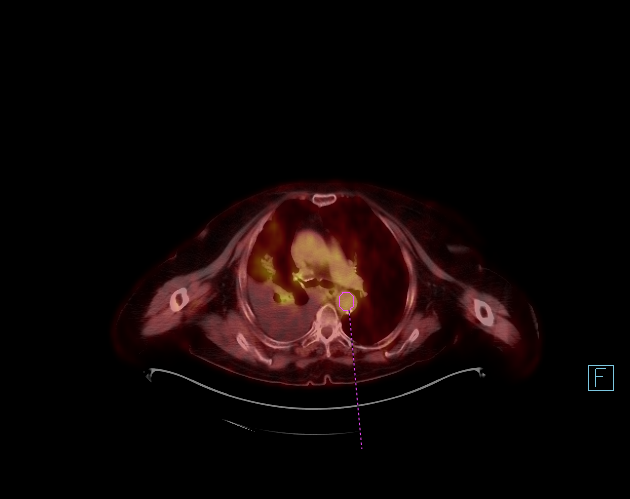
[im 3/22]
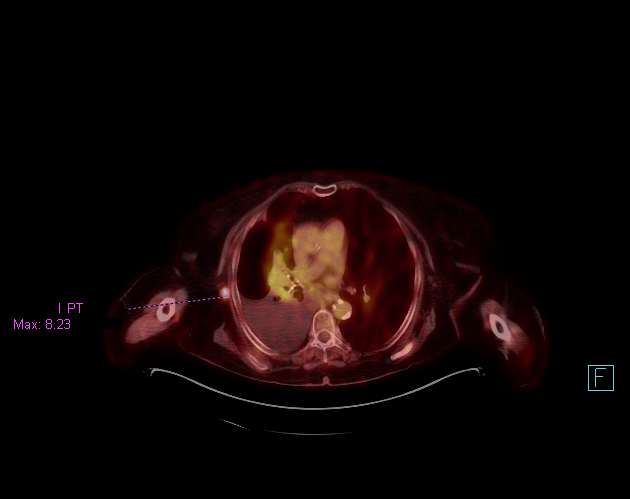
[im 4/22]
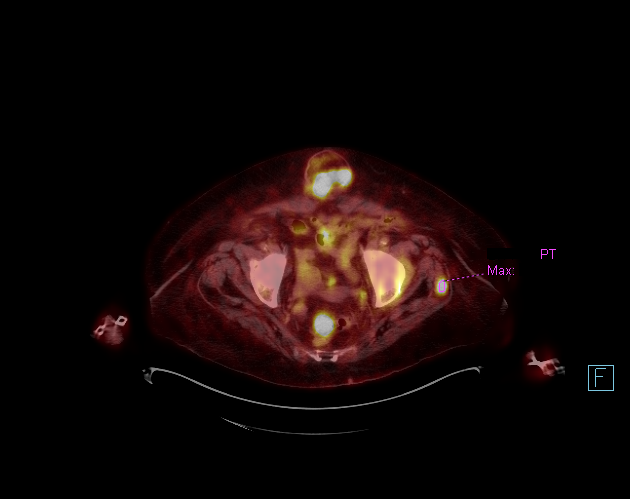
[im 6/22]
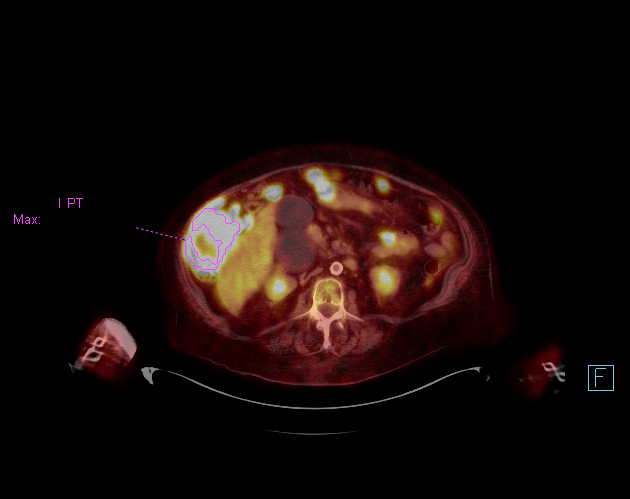
[im 7/22]
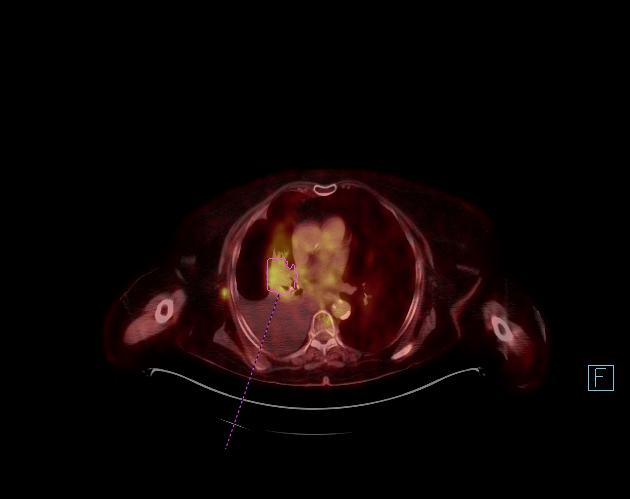
[im 9/22]
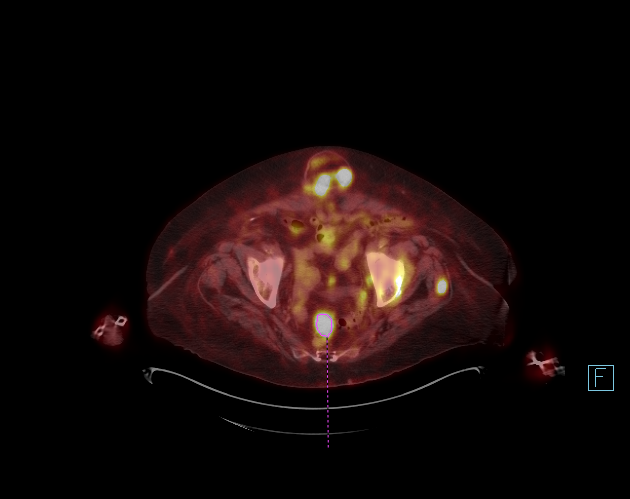
[im 10/22]
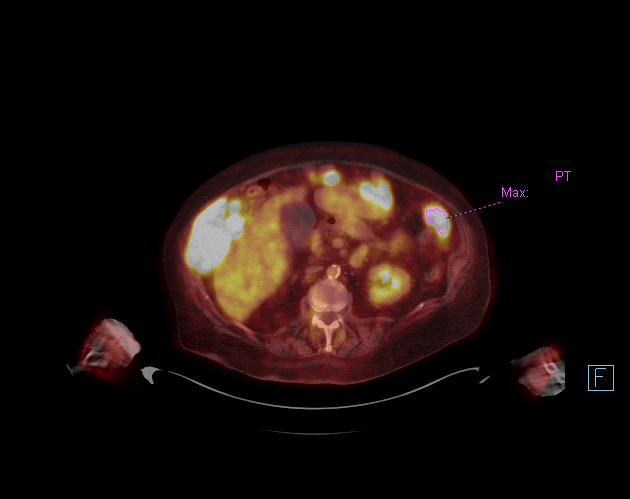
[im 12/22]
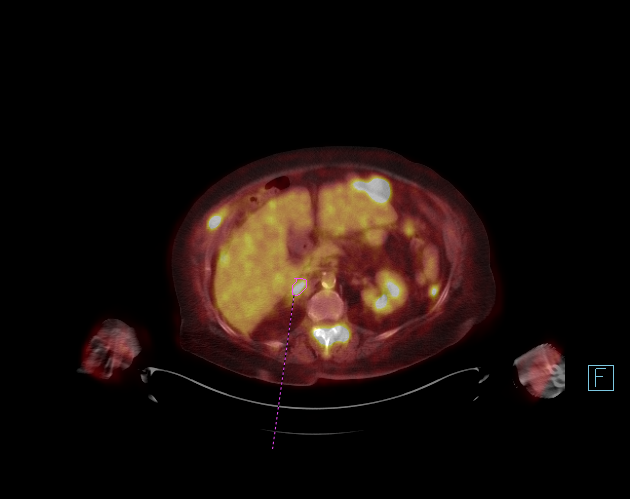
[im 13/22]
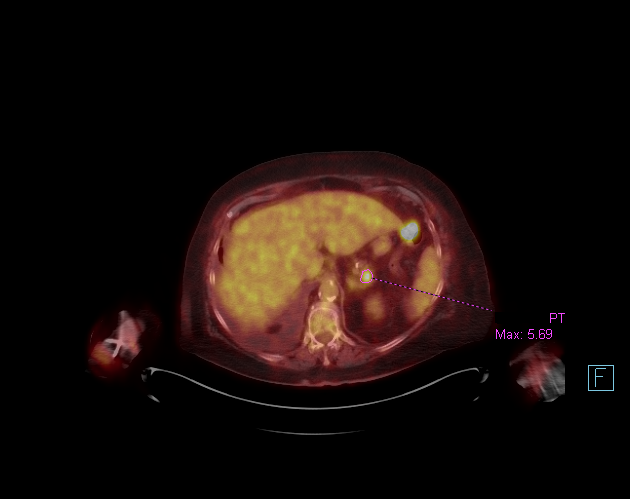
[im 14/22]
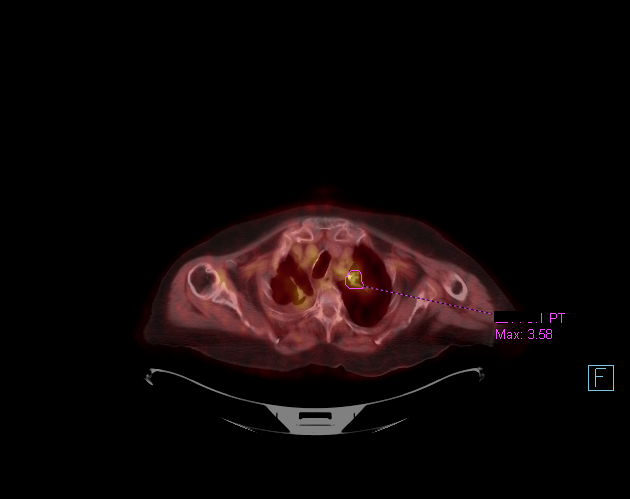
[im 16/22]
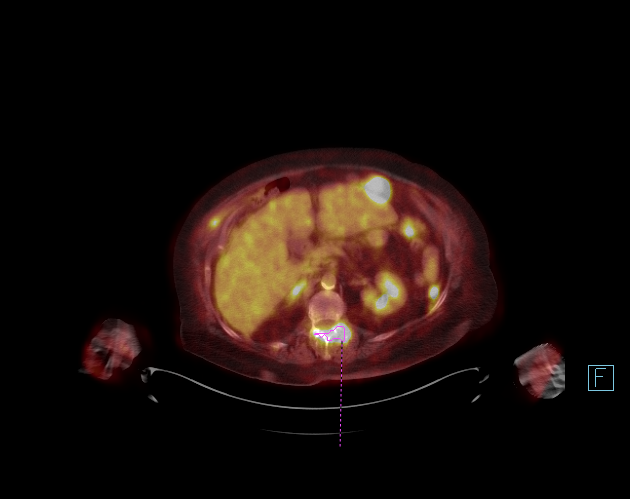
[im 17/22]
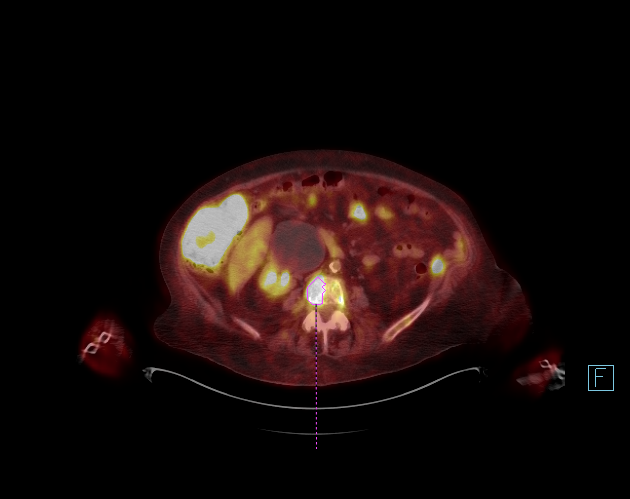
[im 19/22]
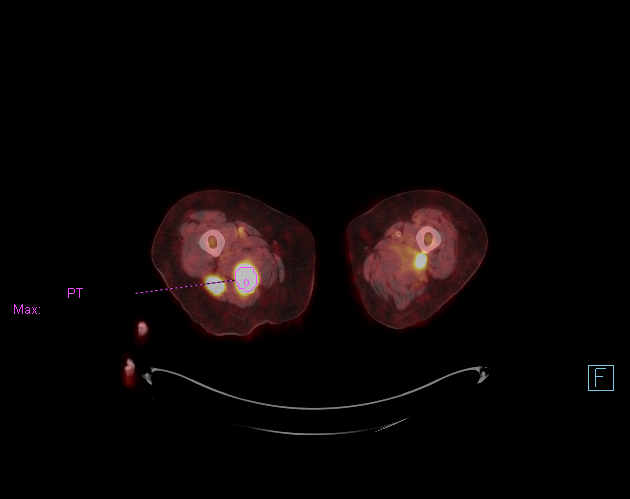
[im 20/22]
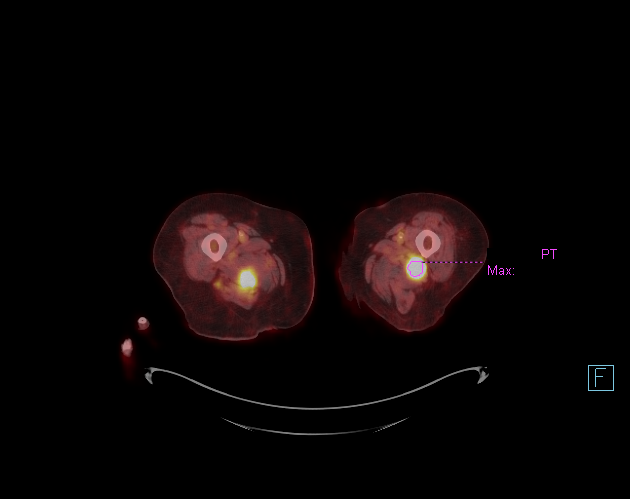
[im 22/22]
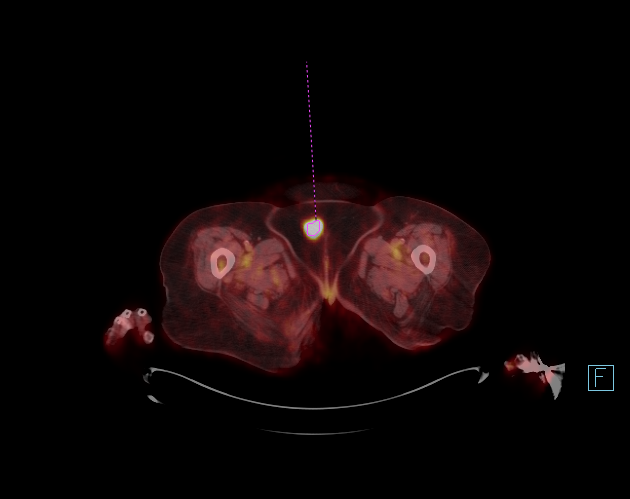

[15 of 22 positions shown; findings below may reference images not displayed]

FINDINGS: Mediastinal blood pool activity: SUV max

Liver activity: SUV max NA

NECK: No hypermetabolic cervical lymphadenopathy.

Incidental CT findings: none

CHEST: Radiation changes in the medial right upper lobe/perihilar
region, max SUV 4.7.

Mild nodularity in the posterior right upper lobe (series 8/image
15), max SUV 4.1. Mild nodularity in the medial left upper lobe
(series 8/image 9), max SUV 3.6.

Moderate right pleural effusion.

No hypermetabolic thoracic lymphadenopathy.

Incidental CT findings: Atherosclerotic calcifications of the aortic
arch. 3 vessel coronary atherosclerosis. Moderate hiatal hernia.

ABDOMEN/PELVIS: Widespread peritoneal disease throughout the
abdomen/pelvis, including:

--6.4 x 7.3 cm implant overlying the right liver (series 4/image
110), max SUV

--2.2 x 3.1 cm implant beneath the left mid anterior abdominal wall
(series 4/image 102), max SUV

--2.7 cm implant within a moderate fat containing periumbilical
hernia (series 4/image 143), max SUV

--2.3 cm implant in the dependent pelvis (series 4/image 141), max
SUV

Focal hypermetabolism along the anterior right liver (PET image 92),
without definite parenchymal lesion on prior enhanced CT chest but
possibly with overlying pericapsular soft tissue, favoring a
peritoneal implant over a hepatic metastasis.

15 mm soft tissue lesion in the pancreatic tail (series 4/image 87),
max SUV 5.7.

Mild hypermetabolism involving the right adrenal gland, max SUV 8.1.

No hypermetabolic abdominopelvic lymphadenopathy.

Incidental CT findings: Cholelithiasis. Large right upper pole renal
cyst. Atherosclerotic calcifications the abdominal aorta and branch
vessels. Sigmoid diverticulosis, without evidence of diverticulitis.

SKELETON: Multifocal osseous metastases, including:

--Left lateral clavicle, max SUV

--Right T10 vertebral body, max SUV

--Posterior elements at L1-2, max SUV

--L4 vertebral body, max SUV

--Left iliac bone/acetabulum, max SUV

Multifocal soft tissue/intramuscular metastases, including:

--Right lateral chest wall, max SUV

--Left lateral abdominal wall, max SUV

--Right perineal region, max SUV

--Left posterior thigh, max SUV

--Right posterior thigh, max SUV

Incidental CT findings: none
IMPRESSION: Radiation changes in the medial right upper lobe/perihilar region.
Bilateral pulmonary nodules/metastases, as above. Moderate right
pleural effusion.

Widespread peritoneal disease in the abdomen/pelvis. Possible right
hepatic metastasis versus overlying peritoneal implant. Associated
pancreatic and right adrenal metastases.

Multifocal osseous, soft tissue, and intramuscular metastases.

## 2023-09-25 NOTE — Progress Notes (Signed)
 This encounter was created in error - please disregard.
